# Patient Record
Sex: Male | Born: 1945 | Race: Black or African American | Hispanic: No | Marital: Married | Smoking: Former smoker
Health system: Southern US, Community
[De-identification: ages and names within clinical notes are randomized; demographics above are authoritative.]

## PROBLEM LIST (undated history)

## (undated) DIAGNOSIS — F419 Anxiety disorder, unspecified: Secondary | ICD-10-CM

## (undated) DIAGNOSIS — I10 Essential (primary) hypertension: Secondary | ICD-10-CM

## (undated) DIAGNOSIS — G4730 Sleep apnea, unspecified: Secondary | ICD-10-CM

## (undated) DIAGNOSIS — M1990 Unspecified osteoarthritis, unspecified site: Secondary | ICD-10-CM

## (undated) DIAGNOSIS — C801 Malignant (primary) neoplasm, unspecified: Secondary | ICD-10-CM

## (undated) DIAGNOSIS — K219 Gastro-esophageal reflux disease without esophagitis: Secondary | ICD-10-CM

## (undated) DIAGNOSIS — F32A Depression, unspecified: Secondary | ICD-10-CM

## (undated) DIAGNOSIS — R0600 Dyspnea, unspecified: Secondary | ICD-10-CM

## (undated) DIAGNOSIS — J189 Pneumonia, unspecified organism: Secondary | ICD-10-CM

## (undated) HISTORY — PX: PROSTATE SURGERY: SHX751

## (undated) HISTORY — PX: ROTATOR CUFF REPAIR: SHX139

---

## 1998-03-08 ENCOUNTER — Encounter: Payer: Self-pay | Admitting: *Deleted

## 1998-03-08 ENCOUNTER — Ambulatory Visit (HOSPITAL_COMMUNITY): Admission: RE | Admit: 1998-03-08 | Discharge: 1998-03-08 | Payer: Self-pay | Admitting: *Deleted

## 1998-07-29 ENCOUNTER — Encounter: Payer: Self-pay | Admitting: Family Medicine

## 1998-07-29 ENCOUNTER — Ambulatory Visit (HOSPITAL_COMMUNITY): Admission: RE | Admit: 1998-07-29 | Discharge: 1998-07-29 | Payer: Self-pay | Admitting: Family Medicine

## 1999-12-19 ENCOUNTER — Emergency Department (HOSPITAL_COMMUNITY): Admission: EM | Admit: 1999-12-19 | Discharge: 1999-12-19 | Payer: Self-pay

## 2000-03-21 ENCOUNTER — Encounter: Admission: RE | Admit: 2000-03-21 | Discharge: 2000-03-21 | Payer: Self-pay | Admitting: Urology

## 2000-03-21 ENCOUNTER — Encounter: Payer: Self-pay | Admitting: Urology

## 2001-02-11 ENCOUNTER — Encounter: Payer: Self-pay | Admitting: Emergency Medicine

## 2001-02-11 ENCOUNTER — Emergency Department (HOSPITAL_COMMUNITY): Admission: EM | Admit: 2001-02-11 | Discharge: 2001-02-12 | Payer: Self-pay | Admitting: Emergency Medicine

## 2001-04-26 ENCOUNTER — Emergency Department (HOSPITAL_COMMUNITY): Admission: EM | Admit: 2001-04-26 | Discharge: 2001-04-26 | Payer: Self-pay | Admitting: *Deleted

## 2002-06-25 ENCOUNTER — Encounter: Admission: RE | Admit: 2002-06-25 | Discharge: 2002-09-23 | Payer: Self-pay | Admitting: *Deleted

## 2003-10-30 ENCOUNTER — Ambulatory Visit (HOSPITAL_COMMUNITY): Admission: RE | Admit: 2003-10-30 | Discharge: 2003-10-30 | Payer: Self-pay | Admitting: Gastroenterology

## 2006-06-28 ENCOUNTER — Emergency Department (HOSPITAL_COMMUNITY): Admission: EM | Admit: 2006-06-28 | Discharge: 2006-06-28 | Payer: Self-pay | Admitting: Emergency Medicine

## 2008-11-10 ENCOUNTER — Ambulatory Visit: Payer: Self-pay | Admitting: Sports Medicine

## 2008-11-10 DIAGNOSIS — M2140 Flat foot [pes planus] (acquired), unspecified foot: Secondary | ICD-10-CM | POA: Insufficient documentation

## 2008-11-10 DIAGNOSIS — M7660 Achilles tendinitis, unspecified leg: Secondary | ICD-10-CM

## 2008-11-10 DIAGNOSIS — M2010 Hallux valgus (acquired), unspecified foot: Secondary | ICD-10-CM | POA: Insufficient documentation

## 2008-11-10 DIAGNOSIS — M79609 Pain in unspecified limb: Secondary | ICD-10-CM

## 2009-03-07 ENCOUNTER — Emergency Department (HOSPITAL_COMMUNITY): Admission: EM | Admit: 2009-03-07 | Discharge: 2009-03-07 | Payer: Self-pay | Admitting: Emergency Medicine

## 2009-06-28 ENCOUNTER — Emergency Department (HOSPITAL_COMMUNITY): Admission: EM | Admit: 2009-06-28 | Discharge: 2009-06-29 | Payer: Self-pay | Admitting: Emergency Medicine

## 2010-07-02 ENCOUNTER — Encounter: Payer: Self-pay | Admitting: *Deleted

## 2010-07-27 LAB — URINALYSIS, ROUTINE W REFLEX MICROSCOPIC
Glucose, UA: NEGATIVE mg/dL
Hgb urine dipstick: NEGATIVE
Ketones, ur: NEGATIVE mg/dL
pH: 5.5 (ref 5.0–8.0)

## 2010-07-27 LAB — URINE MICROSCOPIC-ADD ON

## 2010-09-09 NOTE — Op Note (Signed)
NAME:  Dean Brown, Dean Brown                          ACCOUNT NO.:  [id number]   MEDICAL RECORD NO.:  [id number]                   PATIENT TYPE:  AMB   LOCATION:  ENDO                                 FACILITY:  [facility]   PHYSICIAN:  John C. Madilyn Fireman, M.D.                 DATE OF BIRTH:  [birth year]   DATE OF PROCEDURE:  10/30/2003  DATE OF DISCHARGE:                                 OPERATIVE REPORT   PROCEDURE:  Colonoscopy.   INDICATIONS FOR PROCEDURE:  Family history of colon cancer in a first-degree  relative.   DESCRIPTION OF PROCEDURE:  The patient was placed in the left lateral  decubitus position and placed on the pulse monitor with continuous low-flow  oxygen delivered by nasal cannula.  He was sedated with 25 mcg IV fentanyl  and 1 mg IV Versed in addition to the medicine given for the EGD.  The  Olympus video colonoscope was inserted into the rectum and advanced to the  cecum, confirmed by transillumination of McBurney's point and visualization  of the ileocecal valve and appendiceal orifice.  Prep was generally good but  was suboptimal in some areas and I could not rule out small lesions less  than 1 cm in diameter in all areas. Otherwise the cecum, ascending,  transverse, descending, sigmoid, and rectum all appeared normal with no  masses, polyps, diverticula, or other mucosal abnormalities.  The scope was  then withdrawn and the patient returned to the recovery room in stable  condition.  He tolerated the procedure well and there were no immediate  complications.   IMPRESSION:  Normal colonoscopy.   PLAN:  Repeat study in five years based on his family history.                                               John C. Madilyn Fireman, M.D.    JCH/MEDQ  D:  10/30/2003  T:  10/30/2003  Job:  [id number]   cc:   Stacie Acres. White, M.D.  [address]  [place]  [place] [address]  Fax: [phone]

## 2010-09-09 NOTE — Op Note (Signed)
NAME:  Dean Brown, Dean Brown                          ACCOUNT NO.:  [id number]   MEDICAL RECORD NO.:  [id number]                   PATIENT TYPE:  AMB   LOCATION:  ENDO                                 FACILITY:  [facility]   PHYSICIAN:  John C. Madilyn Fireman, M.D.                 DATE OF BIRTH:  1945/11/15   DATE OF PROCEDURE:  10/30/2003  DATE OF DISCHARGE:                                 OPERATIVE REPORT   PROCEDURE:  Esophagogastroduodenoscopy.   INDICATIONS FOR PROCEDURE:  The patient undergoing colonoscopy due to a  family history of colon cancer who has had chronic reflux symptoms requiring  chronic use of proton pump inhibitors. The procedure is being done as a one  time screen for Barrett's esophagus.   DESCRIPTION OF PROCEDURE:  The patient was placed in the left lateral  decubitus position then placed on the pulse monitor with continuous low flow  oxygen delivered by nasal cannula. He was sedated with 50 mcg IV fentanyl  and 5 mg of IV Versed.  The Olympus video endoscope was advanced under  direct vision into the oropharynx and esophagus. The esophagus was straight  and of normal caliber with the squamocolumnar line at 37 cm above a 1 1/2 cm  hiatal hernia. There is no visible  esophagitis, ring, stricture or other  abnormality of the GE junction and the squamocolumnar line did appear to be  at the lower esophageal sphincter. The stomach was entered and a small  amount of liquid secretions were suctioned from the fundus. Retroflexed view  of the cardia confirmed a small hiatal hernia and was unremarkable. The  fundus, body, antrum and pylorus all appeared normal. The duodenum was  entered and both the bulb and second portion are well inspected and appear  to be within normal limits. The scope was then withdrawn and the patient  prepared for colonoscopy.  He tolerated the procedure well and there were no  immediate complications.   IMPRESSION:  Small hiatal hernia otherwise normal  study.                                               John C. Madilyn Fireman, M.D.    JCH/MEDQ  D:  10/30/2003  T:  10/30/2003  Job:  [id number]   cc:   Stacie Acres. White, M.D.  [address]  [place]  [place] [address]  Fax: [phone]

## 2011-07-22 DIAGNOSIS — E291 Testicular hypofunction: Secondary | ICD-10-CM | POA: Insufficient documentation

## 2011-08-04 DIAGNOSIS — G4733 Obstructive sleep apnea (adult) (pediatric): Secondary | ICD-10-CM | POA: Diagnosis present

## 2011-10-13 ENCOUNTER — Emergency Department (INDEPENDENT_AMBULATORY_CARE_PROVIDER_SITE_OTHER)
Admission: EM | Admit: 2011-10-13 | Discharge: 2011-10-13 | Disposition: A | Discharge status: Home / Self Care | Payer: BC Managed Care – PPO | Source: Home / Self Care | Attending: Emergency Medicine | Admitting: Emergency Medicine

## 2011-10-13 ENCOUNTER — Emergency Department (INDEPENDENT_AMBULATORY_CARE_PROVIDER_SITE_OTHER)

## 2011-10-13 ENCOUNTER — Encounter (HOSPITAL_COMMUNITY): Payer: Self-pay | Admitting: Emergency Medicine

## 2011-10-13 DIAGNOSIS — M5430 Sciatica, unspecified side: Secondary | ICD-10-CM

## 2011-10-13 HISTORY — DX: Malignant (primary) neoplasm, unspecified: C80.1

## 2011-10-13 HISTORY — DX: Essential (primary) hypertension: I10

## 2011-10-13 MED ORDER — KETOROLAC TROMETHAMINE 60 MG/2ML IM SOLN
INTRAMUSCULAR | Status: AC
  Filled 2011-10-13: qty 2

## 2011-10-13 MED ORDER — ONDANSETRON HCL 4 MG/2ML IJ SOLN
4.0000 mg | Freq: Once | INTRAMUSCULAR | Status: AC
  Administered 2011-10-13: 4 mg via INTRAMUSCULAR

## 2011-10-13 MED ORDER — OXYCODONE-ACETAMINOPHEN 5-325 MG PO TABS: 2.0000 | ORAL_TABLET | ORAL | Status: AC | PRN

## 2011-10-13 MED ORDER — KETOROLAC TROMETHAMINE 60 MG/2ML IM SOLN
60.0000 mg | Freq: Once | INTRAMUSCULAR | Status: AC
  Administered 2011-10-13: 60 mg via INTRAMUSCULAR

## 2011-10-13 MED ORDER — HYDROMORPHONE HCL PF 1 MG/ML IJ SOLN
2.0000 mg | Freq: Once | INTRAMUSCULAR | Status: AC
  Administered 2011-10-13: 2 mg via INTRAMUSCULAR

## 2011-10-13 MED ORDER — DIAZEPAM 5 MG PO TABS: 5.0000 mg | ORAL_TABLET | Freq: Two times a day (BID) | ORAL | Status: DC

## 2011-10-13 MED ORDER — HYDROMORPHONE HCL PF 1 MG/ML IJ SOLN
INTRAMUSCULAR | Status: AC
  Filled 2011-10-13: qty 2

## 2011-10-13 MED ORDER — ONDANSETRON HCL 4 MG/2ML IJ SOLN
INTRAMUSCULAR | Status: AC
  Filled 2011-10-13: qty 2

## 2011-10-13 NOTE — Discharge Instructions (Signed)

## 2011-10-13 NOTE — ED Notes (Signed)
Return from xray

## 2011-10-13 NOTE — ED Provider Notes (Signed)
History     CSN: [id number]  Arrival date & time 10/13/11  1358   First MD Initiated Contact with Patient 10/13/11 1550      Chief Complaint  Patient presents with  . Back Pain    (Consider location/radiation/quality/duration/timing/severity/associated sxs/prior treatment) Patient is a 66 y.o. male presenting with back pain. The history is provided by the patient. No language interpreter was used.  Back Pain  This is a new problem. The current episode started more than 1 week ago. The problem occurs constantly. The problem has been gradually worsening. The pain is associated with no known injury. The pain is present in the lumbar spine and sacro-iliac joint. The quality of the pain is described as stabbing. The pain is at a severity of 6/10. The pain is moderate. The symptoms are aggravated by bending. The treatment provided moderate relief.  [facility].  Pt reports no relief.  Pain is severe.  Past Medical History  Diagnosis Date  . Hypertension   . Diabetes mellitus   . Cancer     Past Surgical History  Procedure Date  . Rotator cuff repair     left shoulder  . Prostate surgery     s/p cancer    No family history on file.  History  Substance Use Topics  . Smoking status: Never Smoker   . Smokeless tobacco: Not on file  . Alcohol Use: Yes      Review of Systems  Musculoskeletal: Positive for back pain.  All other systems reviewed and are negative.    Allergies  Review of patient's allergies indicates no known allergies.  Home Medications   Current Outpatient Rx  Name Route Sig Dispense Refill  . AMLODIPINE BESYLATE 10 MG PO TABS Oral Take 10 mg by mouth daily.    . ASPIRIN 325 MG PO TABS Oral Take 325 mg by mouth daily.    Marland Kitchen DICLOFENAC SODIUM 1 % TD GEL Topical Apply topically.    Marland Kitchen GLIPIZIDE ER 10 MG PO TB24 Oral Take 10 mg by mouth daily.    . INSULIN GLARGINE 100 UNIT/ML [place] SOLN Subcutaneous  Inject 10 Units into the skin at bedtime.    Marland Kitchen LISINOPRIL 10 MG PO TABS Oral Take 10 mg by mouth daily.    Marland Kitchen OMEPRAZOLE 20 MG PO CPDR Oral Take 20 mg by mouth daily.    Marland Kitchen PIOGLITAZONE HCL 15 MG PO TABS Oral Take 20 mg by mouth daily.    . TRIAMTERENE-HCTZ 37.5-25 MG PO CAPS Oral Take 1 capsule by mouth every morning.    Marland Kitchen NITROGLYCERIN 0.2 MG/HR TD PT24  1/4 patch over achilles each day - change every morning       BP 163/84  Pulse 93  Temp 97.1 F (36.2 C) (Oral)  Resp 16  SpO2 97%  Physical Exam  Nursing note and vitals reviewed. Constitutional: He is oriented to person, place, and time. He appears well-developed and well-nourished.  HENT:  Head: Normocephalic and atraumatic.  Right Ear: External ear normal.  Left Ear: External ear normal.  Nose: Nose normal.  Mouth/Throat: Oropharynx is clear and moist.  Eyes: Pupils are equal, round, and reactive to light.  Cardiovascular: Normal rate and normal heart sounds.   Pulmonary/Chest: Effort normal and breath sounds normal.  Abdominal: Soft. Bowel sounds are normal.  Musculoskeletal: Normal range of motion.  Neurological: He is alert and oriented to person, place, and time. He  has normal reflexes.  Skin: Skin is warm.  Psychiatric: He has a normal mood and affect.    ED Course  Procedures (including critical care time)  Labs Reviewed - No data to display Dg Lumbar Spine Complete  10/13/2011  *RADIOLOGY REPORT*  Clinical Data: Back pain, injured 5 days ago working in yard, past history of prostate cancer  LUMBAR SPINE - COMPLETE 4+ VIEW  Comparison: None.  Findings: Five non-rib bearing lumbar vertebrae. Osseous mineralization normal. Question minimal sclerosis along the SI joints bilaterally. Vertebral body and disc space heights maintained. Tiny end plate spurs at L5. No acute fracture, subluxation or bone destruction. No spondylolysis. SI joints symmetric.  IMPRESSION: No acute lumbar spine abnormalities. Question mild bilateral  sacroiliitis.  Original Report Authenticated By: Lollie Marrow, M.D.     1. Sciatica       MDM  Pt given rx for valium and percocet         Elson Areas, [place] 10/13/11 1711

## 2011-10-13 NOTE — ED Notes (Signed)
pcp is located at [facility]

## 2011-10-13 NOTE — ED Notes (Signed)
Low back pain onset one week ago .  Patient reports he was working in garden, lifting bags of topsoil, did not feel pain at that time, but later started having pain.  Patient went to [facility] on Monday and Tuesday, but continues to hurt.  Denies numbness or tingling in legs

## 2011-10-14 NOTE — ED Provider Notes (Signed)
D/w PA. Exam was nml. No red flags. Agree with plan Medical screening examination/treatment/procedure(s) were performed by non-physician practitioner and as supervising physician I was immediately available for consultation/collaboration.  Luiz Blare MD   Luiz Blare, MD 10/14/11 [phone]

## 2011-10-17 ENCOUNTER — Other Ambulatory Visit: Payer: Self-pay | Admitting: Family Medicine

## 2011-10-17 DIAGNOSIS — M545 Low back pain: Secondary | ICD-10-CM

## 2011-10-18 ENCOUNTER — Ambulatory Visit
Admission: RE | Admit: 2011-10-18 | Discharge: 2011-10-18 | Disposition: A | Discharge status: Home / Self Care | Source: Ambulatory Visit | Attending: Family Medicine | Admitting: Family Medicine

## 2011-10-18 DIAGNOSIS — M545 Low back pain: Secondary | ICD-10-CM

## 2011-10-23 ENCOUNTER — Encounter (HOSPITAL_COMMUNITY): Payer: Self-pay | Admitting: Pharmacy Technician

## 2011-10-23 ENCOUNTER — Other Ambulatory Visit: Payer: Self-pay | Admitting: Orthopedic Surgery

## 2011-10-30 ENCOUNTER — Encounter (HOSPITAL_COMMUNITY)
Admission: RE | Admit: 2011-10-30 | Discharge: 2011-10-30 | Disposition: A | Discharge status: Home / Self Care | Payer: BC Managed Care – PPO | Source: Ambulatory Visit | Attending: Orthopedic Surgery | Admitting: Orthopedic Surgery

## 2011-10-30 ENCOUNTER — Encounter (HOSPITAL_COMMUNITY): Payer: Self-pay

## 2011-10-30 HISTORY — DX: Sleep apnea, unspecified: G47.30

## 2011-10-30 HISTORY — DX: Gastro-esophageal reflux disease without esophagitis: K21.9

## 2011-10-30 LAB — DIFFERENTIAL
Eosinophils Relative: 0 % (ref 0–5)
Lymphocytes Relative: 18 % (ref 12–46)
Lymphs Abs: 1.7 10*3/uL (ref 0.7–4.0)
Monocytes Absolute: 0.7 10*3/uL (ref 0.1–1.0)
Monocytes Relative: 8 % (ref 3–12)

## 2011-10-30 LAB — CBC
MCH: 31.2 pg (ref 26.0–34.0)
MCHC: 33.8 g/dL (ref 30.0–36.0)
Platelets: 312 10*3/uL (ref 150–400)
RDW: 12.9 % (ref 11.5–15.5)

## 2011-10-30 LAB — COMPREHENSIVE METABOLIC PANEL
ALT: 87 U/L — ABNORMAL HIGH (ref 0–53)
AST: 33 U/L (ref 0–37)
Albumin: 3.8 g/dL (ref 3.5–5.2)
Alkaline Phosphatase: 67 U/L (ref 39–117)
Calcium: 9.6 mg/dL (ref 8.4–10.5)
GFR calc Af Amer: 49 mL/min — ABNORMAL LOW (ref >90)
Potassium: 3.7 mEq/L (ref 3.5–5.1)
Sodium: 136 mEq/L (ref 135–145)
Total Protein: 7.8 g/dL (ref 6.0–8.3)

## 2011-10-30 LAB — APTT: aPTT: 26 seconds (ref 24–37)

## 2011-10-30 LAB — TYPE AND SCREEN: Antibody Screen: NEGATIVE

## 2011-10-30 LAB — SURGICAL PCR SCREEN
MRSA, PCR: NEGATIVE
Staphylococcus aureus: NEGATIVE

## 2011-10-30 LAB — PROTIME-INR: Prothrombin Time: 13.3 seconds (ref 11.6–15.2)

## 2011-10-30 NOTE — Progress Notes (Addendum)
Pt unable to provide urine sample at PAT, obtain DOS.  Pt states had sleep study and other testing at [facility], requested this along with any other tests such as EKG, ECHO, Stress test. Requested old EKG from [place] for comparison.

## 2011-10-30 NOTE — Pre-Procedure Instructions (Signed)
20 GRAYSIN LUCZYNSKI  10/30/2011   Your procedure is scheduled on:  Wednesday July 10  Call the [facility] Short Stay Center at 8:00 AM for arrival time.  Call this number if you have problems the morning of surgery: [phone]   Remember:   Do not eat food:After Midnight.  May have clear liquids:until Midnight .  Clear liquids include soda, tea, black coffee, apple or grape juice, broth.  Take these medicines the morning of surgery with A SIP OF WATER: Amlodipine, Omeprazole. Bring Nitro to surgery.    Do not wear jewelry, make-up or nail polish.  Do not wear lotions, powders, or perfumes. You may wear deodorant.  Do not shave 48 hours prior to surgery. Men may shave face and neck.  Do not bring valuables to the hospital.  Contacts, dentures or bridgework may not be worn into surgery.  Leave suitcase in the car. After surgery it may be brought to your room.  For patients admitted to the hospital, checkout time is 11:00 AM the day of discharge.   Patients discharged the day of surgery will not be allowed to drive home.  Name and phone number of your driver: family  Special Instructions: Incentive Spirometry - Practice and bring it with you on the day of surgery. and CHG Shower Use Special Wash: 1/2 bottle night before surgery and 1/2 bottle morning of surgery.   Please read over the following fact sheets that you were given: Pain Booklet, Coughing and Deep Breathing, Blood Transfusion Information and Surgical Site Infection Prevention

## 2011-10-31 ENCOUNTER — Encounter (HOSPITAL_COMMUNITY): Payer: Self-pay | Admitting: Vascular Surgery

## 2011-10-31 LAB — ABO/RH: ABO/RH(D): B POS

## 2011-10-31 MED ORDER — CLINDAMYCIN PHOSPHATE 900 MG/50ML IV SOLN
900.0000 mg | INTRAVENOUS | Status: AC
  Administered 2011-11-01: 900 mg via INTRAVENOUS
  Filled 2011-10-31: qty 50

## 2011-10-31 NOTE — Consult Note (Signed)
Anesthesia Chart Review:  Patient is a 66 year old male scheduled for left L5-S1 microdiscectomy on 11/01/11 by Dr. Yevette Edwards.  History includes non-smoker, HTN, DM2, OSA, GERD, prostate cancer.      Labs noted.  Cr 1.64, glucose 248, AST normal at 33, but ALT elevated at 87.  PLT and coags WNL.  Patient could not provide a urine specimen, so a UA is going to be done on arrival.  He will get a CBG on arrival.  CXR on 10/30/11 showed: 1. Stable exam of the chest.  2. No acute cardiopulmonary findings.  3. Prominent ascending aorta may indicate aortic stenosis.  EKG shows ST @ 129, biatrial enlargement, LVH.  (HR was actually documented as 137 on arrival to PAT.)  I was not asked to see him during his PAT visit.  He reported significant pain and was "rushing" to make his appointment.  He denied chest pain or SOB.  He says he was fairly active until approximately three weeks ago when he developed severe hip and leg pain.  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  He reports a remote history of a stress test when he was in the [employer], but otherwise no recent cardiac studies.  I reviewed EKG and CXR findings with Anesthesiologist Dr. Katrinka Blazing.  He will be evaluated by his assigned Anesthesiologist pre-operatively.  If no worrisome cardiac findings such as significant murmur or symptomatic tachycardia, then anticipate he can proceed.    Shonna Chock, PA-C

## 2011-11-01 ENCOUNTER — Inpatient Hospital Stay (HOSPITAL_COMMUNITY)
Admission: RE | Admit: 2011-11-01 | Discharge: 2011-11-02 | DRG: 758 | Disposition: A | Discharge status: Home / Self Care | Payer: BC Managed Care – PPO | Source: Ambulatory Visit | Attending: Orthopedic Surgery | Admitting: Orthopedic Surgery

## 2011-11-01 ENCOUNTER — Encounter (HOSPITAL_COMMUNITY): Payer: Self-pay | Admitting: *Deleted

## 2011-11-01 ENCOUNTER — Encounter (HOSPITAL_COMMUNITY)
Admission: RE | Disposition: A | Discharge status: Home / Self Care | Payer: Self-pay | Source: Ambulatory Visit | Attending: Orthopedic Surgery

## 2011-11-01 ENCOUNTER — Ambulatory Visit (HOSPITAL_COMMUNITY): Payer: BC Managed Care – PPO | Admitting: Vascular Surgery

## 2011-11-01 ENCOUNTER — Ambulatory Visit (HOSPITAL_COMMUNITY): Payer: BC Managed Care – PPO

## 2011-11-01 ENCOUNTER — Encounter (HOSPITAL_COMMUNITY): Payer: Self-pay | Admitting: Vascular Surgery

## 2011-11-01 DIAGNOSIS — K219 Gastro-esophageal reflux disease without esophagitis: Secondary | ICD-10-CM | POA: Diagnosis present

## 2011-11-01 DIAGNOSIS — M5410 Radiculopathy, site unspecified: Secondary | ICD-10-CM

## 2011-11-01 DIAGNOSIS — Z01812 Encounter for preprocedural laboratory examination: Secondary | ICD-10-CM

## 2011-11-01 DIAGNOSIS — M5126 Other intervertebral disc displacement, lumbar region: Principal | ICD-10-CM | POA: Diagnosis present

## 2011-11-01 DIAGNOSIS — I10 Essential (primary) hypertension: Secondary | ICD-10-CM | POA: Diagnosis present

## 2011-11-01 DIAGNOSIS — IMO0002 Reserved for concepts with insufficient information to code with codable children: Secondary | ICD-10-CM | POA: Diagnosis present

## 2011-11-01 DIAGNOSIS — E119 Type 2 diabetes mellitus without complications: Secondary | ICD-10-CM | POA: Diagnosis present

## 2011-11-01 DIAGNOSIS — G4730 Sleep apnea, unspecified: Secondary | ICD-10-CM | POA: Diagnosis present

## 2011-11-01 HISTORY — PX: OTHER SURGICAL HISTORY: SHX169

## 2011-11-01 HISTORY — PX: LUMBAR LAMINECTOMY/DECOMPRESSION MICRODISCECTOMY: SHX5026

## 2011-11-01 LAB — GLUCOSE, CAPILLARY
Glucose-Capillary: 151 mg/dL — ABNORMAL HIGH (ref 70–99)
Glucose-Capillary: 104 mg/dL — ABNORMAL HIGH (ref 70–99)

## 2011-11-01 SURGERY — LUMBAR LAMINECTOMY/DECOMPRESSION MICRODISCECTOMY
Anesthesia: General | Site: Spine Lumbar | Laterality: Left | Wound class: Clean

## 2011-11-01 MED ORDER — POTASSIUM CHLORIDE IN NACL 20-0.9 MEQ/L-% IV SOLN
INTRAVENOUS | Status: DC
  Administered 2011-11-02: 01:00:00 via INTRAVENOUS
  Filled 2011-11-01 (×2): qty 1000

## 2011-11-01 MED ORDER — ONDANSETRON HCL 4 MG/2ML IJ SOLN: 4.0000 mg | Freq: Once | INTRAMUSCULAR | Status: DC | PRN

## 2011-11-01 MED ORDER — ROCURONIUM BROMIDE 100 MG/10ML IV SOLN
INTRAVENOUS | Status: DC | PRN
  Administered 2011-11-01: 50 mg via INTRAVENOUS

## 2011-11-01 MED ORDER — OXYCODONE-ACETAMINOPHEN 5-325 MG PO TABS: 1.0000 | ORAL_TABLET | ORAL | Status: DC | PRN

## 2011-11-01 MED ORDER — LIDOCAINE HCL (CARDIAC) 20 MG/ML IV SOLN
INTRAVENOUS | Status: DC | PRN
  Administered 2011-11-01: 60 mg via INTRAVENOUS
  Administered 2011-11-01: 40 mg via INTRAVENOUS

## 2011-11-01 MED ORDER — DOCUSATE SODIUM 100 MG PO CAPS
100.0000 mg | ORAL_CAPSULE | Freq: Two times a day (BID) | ORAL | Status: DC
  Administered 2011-11-02 (×2): 100 mg via ORAL
  Filled 2011-11-01 (×3): qty 1

## 2011-11-01 MED ORDER — INDIGOTINDISULFONATE SODIUM 8 MG/ML IJ SOLN
INTRAMUSCULAR | Status: DC | PRN
  Administered 2011-11-01: 1 mL via INTRAVENOUS

## 2011-11-01 MED ORDER — DEXAMETHASONE SODIUM PHOSPHATE 4 MG/ML IJ SOLN
INTRAMUSCULAR | Status: DC | PRN
  Administered 2011-11-01: 8 mg via INTRAVENOUS

## 2011-11-01 MED ORDER — AMLODIPINE BESYLATE 10 MG PO TABS
10.0000 mg | ORAL_TABLET | Freq: Every day | ORAL | Status: DC
  Administered 2011-11-02: 10 mg via ORAL
  Filled 2011-11-01: qty 1

## 2011-11-01 MED ORDER — LISINOPRIL 10 MG PO TABS
10.0000 mg | ORAL_TABLET | Freq: Every day | ORAL | Status: DC
  Administered 2011-11-02: 10 mg via ORAL
  Filled 2011-11-01: qty 1

## 2011-11-01 MED ORDER — POVIDONE-IODINE 7.5 % EX SOLN
Freq: Once | CUTANEOUS | Status: DC
  Filled 2011-11-01: qty 118

## 2011-11-01 MED ORDER — ONDANSETRON HCL 4 MG/2ML IJ SOLN: 4.0000 mg | INTRAMUSCULAR | Status: DC | PRN

## 2011-11-01 MED ORDER — PANTOPRAZOLE SODIUM 40 MG PO TBEC
40.0000 mg | DELAYED_RELEASE_TABLET | Freq: Every day | ORAL | Status: DC
  Administered 2011-11-02: 40 mg via ORAL
  Filled 2011-11-01: qty 1

## 2011-11-01 MED ORDER — SODIUM CHLORIDE 0.9 % IJ SOLN: 3.0000 mL | INTRAMUSCULAR | Status: DC | PRN

## 2011-11-01 MED ORDER — NITROGLYCERIN 0.2 MG/HR TD PT24: 0.2000 mg | MEDICATED_PATCH | Freq: Every day | TRANSDERMAL | Status: DC | PRN

## 2011-11-01 MED ORDER — HYDROMORPHONE HCL PF 1 MG/ML IJ SOLN
0.2500 mg | INTRAMUSCULAR | Status: DC | PRN
  Administered 2011-11-01 (×2): 0.5 mg via INTRAVENOUS

## 2011-11-01 MED ORDER — MORPHINE SULFATE 2 MG/ML IJ SOLN: 2.0000 mg | INTRAMUSCULAR | Status: DC | PRN

## 2011-11-01 MED ORDER — THROMBIN 20000 UNITS EX KIT
PACK | CUTANEOUS | Status: AC
  Filled 2011-11-01: qty 1

## 2011-11-01 MED ORDER — FENTANYL CITRATE 0.05 MG/ML IJ SOLN
INTRAMUSCULAR | Status: DC | PRN
  Administered 2011-11-01 (×4): 100 ug via INTRAVENOUS

## 2011-11-01 MED ORDER — HYDROMORPHONE HCL PF 1 MG/ML IJ SOLN
INTRAMUSCULAR | Status: AC
  Filled 2011-11-01: qty 1

## 2011-11-01 MED ORDER — BUPIVACAINE-EPINEPHRINE PF 0.25-1:200000 % IJ SOLN
INTRAMUSCULAR | Status: AC
  Filled 2011-11-01: qty 30

## 2011-11-01 MED ORDER — MENTHOL 3 MG MT LOZG: 1.0000 | LOZENGE | OROMUCOSAL | Status: DC | PRN

## 2011-11-01 MED ORDER — CEFAZOLIN SODIUM 1-5 GM-% IV SOLN
1.0000 g | Freq: Three times a day (TID) | INTRAVENOUS | Status: AC
  Administered 2011-11-02 (×2): 1 g via INTRAVENOUS
  Filled 2011-11-01 (×2): qty 50

## 2011-11-01 MED ORDER — PROPOFOL 10 MG/ML IV EMUL
INTRAVENOUS | Status: DC | PRN
  Administered 2011-11-01: 160 mg via INTRAVENOUS

## 2011-11-01 MED ORDER — THROMBIN 20000 UNITS EX KIT
PACK | CUTANEOUS | Status: DC | PRN
  Administered 2011-11-01: 21:00:00 via TOPICAL

## 2011-11-01 MED ORDER — FENTANYL CITRATE 0.05 MG/ML IJ SOLN
50.0000 ug | INTRAMUSCULAR | Status: DC | PRN
  Administered 2011-11-01 (×2): 50 ug via INTRAVENOUS

## 2011-11-01 MED ORDER — GLYCOPYRROLATE 0.2 MG/ML IJ SOLN
INTRAMUSCULAR | Status: DC | PRN
  Administered 2011-11-01: .6 mg via INTRAVENOUS

## 2011-11-01 MED ORDER — BUPIVACAINE-EPINEPHRINE 0.25% -1:200000 IJ SOLN
INTRAMUSCULAR | Status: DC | PRN
  Administered 2011-11-01: 10 mL

## 2011-11-01 MED ORDER — DEXTROSE 5 % IV SOLN
INTRAVENOUS | Status: DC | PRN
  Administered 2011-11-01: 21:00:00 via INTRAVENOUS

## 2011-11-01 MED ORDER — LACTATED RINGERS IV SOLN
INTRAVENOUS | Status: DC | PRN
  Administered 2011-11-01 (×2): via INTRAVENOUS

## 2011-11-01 MED ORDER — DROPERIDOL 2.5 MG/ML IJ SOLN
INTRAMUSCULAR | Status: DC | PRN
  Administered 2011-11-01: 0.625 mg via INTRAVENOUS

## 2011-11-01 MED ORDER — NEOSTIGMINE METHYLSULFATE 1 MG/ML IJ SOLN
INTRAMUSCULAR | Status: DC | PRN
  Administered 2011-11-01: 5 mg via INTRAVENOUS

## 2011-11-01 MED ORDER — DIAZEPAM 5 MG PO TABS: 5.0000 mg | ORAL_TABLET | Freq: Four times a day (QID) | ORAL | Status: DC | PRN

## 2011-11-01 MED ORDER — FENTANYL CITRATE 0.05 MG/ML IJ SOLN
INTRAMUSCULAR | Status: AC
  Filled 2011-11-01: qty 2

## 2011-11-01 MED ORDER — THROMBIN 20000 UNITS EX SOLR
CUTANEOUS | Status: AC
  Filled 2011-11-01: qty 20000

## 2011-11-01 MED ORDER — ZOLPIDEM TARTRATE 5 MG PO TABS: 5.0000 mg | ORAL_TABLET | Freq: Every evening | ORAL | Status: DC | PRN

## 2011-11-01 MED ORDER — ACETAMINOPHEN 325 MG PO TABS: 650.0000 mg | ORAL_TABLET | ORAL | Status: DC | PRN

## 2011-11-01 MED ORDER — PIOGLITAZONE HCL 30 MG PO TABS
30.0000 mg | ORAL_TABLET | Freq: Every day | ORAL | Status: DC
  Administered 2011-11-02: 30 mg via ORAL
  Filled 2011-11-01: qty 1

## 2011-11-01 MED ORDER — MIDAZOLAM HCL 5 MG/5ML IJ SOLN
INTRAMUSCULAR | Status: DC | PRN
  Administered 2011-11-01 (×2): 2 mg via INTRAVENOUS

## 2011-11-01 MED ORDER — INDIGOTINDISULFONATE SODIUM 8 MG/ML IJ SOLN
INTRAMUSCULAR | Status: AC
  Filled 2011-11-01: qty 5

## 2011-11-01 MED ORDER — ALUM & MAG HYDROXIDE-SIMETH 200-200-20 MG/5ML PO SUSP: 30.0000 mL | Freq: Four times a day (QID) | ORAL | Status: DC | PRN

## 2011-11-01 MED ORDER — PHENOL 1.4 % MT LIQD: 1.0000 | OROMUCOSAL | Status: DC | PRN

## 2011-11-01 MED ORDER — THROMBIN 20000 UNITS EX KIT
PACK | CUTANEOUS | Status: DC | PRN
  Administered 2011-11-01: 20000 [IU] via TOPICAL

## 2011-11-01 MED ORDER — SODIUM CHLORIDE 0.9 % IJ SOLN: 3.0000 mL | Freq: Two times a day (BID) | INTRAMUSCULAR | Status: DC

## 2011-11-01 MED ORDER — INSULIN GLARGINE 100 UNIT/ML ~~LOC~~ SOLN: 10.0000 [IU] | Freq: Every day | SUBCUTANEOUS | Status: DC

## 2011-11-01 MED ORDER — SODIUM CHLORIDE 0.9 % IV SOLN: 250.0000 mL | INTRAVENOUS | Status: DC

## 2011-11-01 MED ORDER — METHYLPREDNISOLONE ACETATE 80 MG/ML IJ SUSP
INTRAMUSCULAR | Status: AC
  Filled 2011-11-01: qty 1

## 2011-11-01 MED ORDER — TRIAMTERENE-HCTZ 37.5-25 MG PO TABS
1.0000 | ORAL_TABLET | Freq: Every day | ORAL | Status: DC
  Administered 2011-11-02: 1 via ORAL
  Filled 2011-11-01: qty 1

## 2011-11-01 MED ORDER — GLIPIZIDE ER 10 MG PO TB24
10.0000 mg | ORAL_TABLET | Freq: Every day | ORAL | Status: DC
  Administered 2011-11-02: 10 mg via ORAL
  Filled 2011-11-01 (×2): qty 1

## 2011-11-01 MED ORDER — ONDANSETRON HCL 4 MG/2ML IJ SOLN
INTRAMUSCULAR | Status: DC | PRN
  Administered 2011-11-01: 4 mg via INTRAVENOUS

## 2011-11-01 MED ORDER — LACTATED RINGERS IV SOLN
INTRAVENOUS | Status: DC
  Administered 2011-11-01: 18:00:00 via INTRAVENOUS

## 2011-11-01 MED ORDER — METOCLOPRAMIDE HCL 5 MG/ML IJ SOLN
INTRAMUSCULAR | Status: DC | PRN
  Administered 2011-11-01: 10 mg via INTRAVENOUS

## 2011-11-01 MED ORDER — ACETAMINOPHEN 650 MG RE SUPP: 650.0000 mg | RECTAL | Status: DC | PRN

## 2011-11-01 MED ORDER — SENNA 8.6 MG PO TABS
1.0000 | ORAL_TABLET | Freq: Two times a day (BID) | ORAL | Status: DC
  Administered 2011-11-02 (×2): 8.6 mg via ORAL
  Filled 2011-11-01 (×3): qty 1

## 2011-11-01 SURGICAL SUPPLY — 62 items
APL SKNCLS STERI-STRIP NONHPOA (GAUZE/BANDAGES/DRESSINGS) ×2
BENZOIN TINCTURE PRP APPL 2/3 (GAUZE/BANDAGES/DRESSINGS) ×2 IMPLANT
BUR ROUND PRECISION 4.0 (BURR) ×2 IMPLANT
CANISTER SUCTION 2500CC (MISCELLANEOUS) ×2 IMPLANT
CLOTH BEACON ORANGE TIMEOUT ST (SAFETY) ×2 IMPLANT
CLSR STERI-STRIP ANTIMIC 1/2X4 (GAUZE/BANDAGES/DRESSINGS) ×1 IMPLANT
CONT SPEC STER OR (MISCELLANEOUS) ×1 IMPLANT
CORDS BIPOLAR (ELECTRODE) ×2 IMPLANT
COVER SURGICAL LIGHT HANDLE (MISCELLANEOUS) ×2 IMPLANT
DRAIN CHANNEL 10F 3/8 F FF (DRAIN) IMPLANT
DRAPE POUCH INSTRU U-SHP 10X18 (DRAPES) ×4 IMPLANT
DRAPE SURG 17X23 STRL (DRAPES) ×9 IMPLANT
DURAPREP 26ML APPLICATOR (WOUND CARE) ×2 IMPLANT
ELECT BLADE 4.0 EZ CLEAN MEGAD (MISCELLANEOUS)
ELECT BLADE 6.5 EXT (BLADE) IMPLANT
ELECT CAUTERY BLADE 6.4 (BLADE) ×2 IMPLANT
ELECT REM PT RETURN 9FT ADLT (ELECTROSURGICAL) ×2
ELECTRODE BLDE 4.0 EZ CLN MEGD (MISCELLANEOUS) IMPLANT
ELECTRODE REM PT RTRN 9FT ADLT (ELECTROSURGICAL) ×1 IMPLANT
EVACUATOR SILICONE 100CC (DRAIN) IMPLANT
FILTER STRAW FLUID ASPIR (MISCELLANEOUS) ×3 IMPLANT
GAUZE SPONGE 4X4 16PLY XRAY LF (GAUZE/BANDAGES/DRESSINGS) ×3 IMPLANT
GLOVE BIO SURGEON STRL SZ8 (GLOVE) ×2 IMPLANT
GLOVE BIOGEL PI IND STRL 8 (GLOVE) ×1 IMPLANT
GLOVE BIOGEL PI INDICATOR 8 (GLOVE) ×1
GOWN STRL NON-REIN LRG LVL3 (GOWN DISPOSABLE) ×4 IMPLANT
IV CATH 14GX2 1/4 (CATHETERS) ×2 IMPLANT
KIT BASIN OR (CUSTOM PROCEDURE TRAY) ×2 IMPLANT
KIT ROOM TURNOVER OR (KITS) ×2 IMPLANT
NDL 18GX1X1/2 (RX/OR ONLY) (NEEDLE) ×1 IMPLANT
NDL HYPO 25GX1X1/2 BEV (NEEDLE) ×1 IMPLANT
NDL SPNL 18GX3.5 QUINCKE PK (NEEDLE) ×2 IMPLANT
NEEDLE 18GX1X1/2 (RX/OR ONLY) (NEEDLE) ×2 IMPLANT
NEEDLE HYPO 25GX1X1/2 BEV (NEEDLE) ×2 IMPLANT
NEEDLE SPNL 18GX3.5 QUINCKE PK (NEEDLE) ×2 IMPLANT
NS IRRIG 1000ML POUR BTL (IV SOLUTION) ×2 IMPLANT
PACK LAMINECTOMY ORTHO (CUSTOM PROCEDURE TRAY) ×2 IMPLANT
PACK UNIVERSAL I (CUSTOM PROCEDURE TRAY) ×2 IMPLANT
PAD ARMBOARD 7.5X6 YLW CONV (MISCELLANEOUS) ×4 IMPLANT
PATTIES SURGICAL .5 X.5 (GAUZE/BANDAGES/DRESSINGS) ×1 IMPLANT
PATTIES SURGICAL .5 X1 (DISPOSABLE) ×2 IMPLANT
PATTIES SURGICAL 1X1 (DISPOSABLE) IMPLANT
SPONGE GAUZE 4X4 12PLY (GAUZE/BANDAGES/DRESSINGS) ×2 IMPLANT
STRIP CLOSURE SKIN 1/2X4 (GAUZE/BANDAGES/DRESSINGS) ×1 IMPLANT
SURGIFLO TRUKIT (HEMOSTASIS) IMPLANT
SUT ETHILON 3 0 FSL (SUTURE) IMPLANT
SUT VIC AB 0 CT1 27 (SUTURE)
SUT VIC AB 0 CT1 27XBRD ANBCTR (SUTURE) IMPLANT
SUT VIC AB 0 CT2 27 (SUTURE) ×2 IMPLANT
SUT VIC AB 1 CT1 18XCR BRD 8 (SUTURE) ×1 IMPLANT
SUT VIC AB 1 CT1 8-18 (SUTURE) ×2
SUT VIC AB 2-0 CT2 18 VCP726D (SUTURE) ×2 IMPLANT
SYR 20CC LL (SYRINGE) IMPLANT
SYR BULB IRRIGATION 50ML (SYRINGE) ×2 IMPLANT
SYR CONTROL 10ML LL (SYRINGE) ×2 IMPLANT
SYR TB 1ML 26GX3/8 SAFETY (SYRINGE) ×4 IMPLANT
SYR TB 1ML LUER SLIP (SYRINGE) ×4 IMPLANT
TAPE CLOTH SURG 4X10 WHT LF (GAUZE/BANDAGES/DRESSINGS) ×1 IMPLANT
TOWEL OR 17X24 6PK STRL BLUE (TOWEL DISPOSABLE) ×2 IMPLANT
TOWEL OR 17X26 10 PK STRL BLUE (TOWEL DISPOSABLE) ×2 IMPLANT
WATER STERILE IRR 1000ML POUR (IV SOLUTION) ×1 IMPLANT
YANKAUER SUCT BULB TIP NO VENT (SUCTIONS) ×2 IMPLANT

## 2011-11-01 NOTE — Anesthesia Procedure Notes (Signed)
Procedure Name: Intubation Date/Time: 11/01/2011 8:48 PM Performed by: Wray Kearns A Pre-anesthesia Checklist: Patient identified, Timeout performed, Emergency Drugs available, Suction available and Patient being monitored Patient Re-evaluated:Patient Re-evaluated prior to inductionOxygen Delivery Method: Circle system utilized Intubation Type: IV induction and Cricoid Pressure applied Ventilation: Mask ventilation without difficulty and Oral airway inserted - appropriate to patient size Laryngoscope Size: Mac and 4 Grade View: Grade II Tube type: Oral Tube size: 8.0 mm Number of attempts: 1 Airway Equipment and Method: Stylet Placement Confirmation: ETT inserted through vocal cords under direct vision,  breath sounds checked- equal and bilateral and positive ETCO2 Secured at: 23 cm Tube secured with: Tape Dental Injury: Teeth and Oropharynx as per pre-operative assessment

## 2011-11-01 NOTE — Preoperative (Signed)
Beta Blockers   Reason not to administer Beta Blockers:Not Applicable 

## 2011-11-01 NOTE — H&P (Signed)
PREOPERATIVE H&P  Chief Complaint: left leg pain  HPI: Dean Brown is a 66 y.o. male who presents with left leg pain  Past Medical History  Diagnosis Date  . Hypertension   . Diabetes mellitus   . Sleep apnea     cpap sleep study - [facility]  . GERD (gastroesophageal reflux disease)   . Cancer     Prostate cancer   Past Surgical History  Procedure Date  . Rotator cuff repair     left shoulder  . Prostate surgery     s/p cancer   History   Social History  . Marital Status: Married    Spouse Name: N/A    Number of Children: N/A  . Years of Education: N/A   Social History Main Topics  . Smoking status: Never Smoker   . Smokeless tobacco: Not on file  . Alcohol Use: Yes  . Drug Use: No  . Sexually Active:    Other Topics Concern  . Not on file   Social History Narrative  . No narrative on file   No family history on file. Allergies  Allergen Reactions  . Metformin And Related Nausea Only   Prior to Admission medications   Medication Sig Start Date End Date Taking? Authorizing Provider  amLODipine (NORVASC) 10 MG tablet Take 10 mg by mouth daily.   Yes Historical Provider, MD  aspirin 325 MG tablet Take 325 mg by mouth daily.   Yes Historical Provider, MD  diclofenac sodium (VOLTAREN) 1 % GEL Apply topically.   Yes Historical Provider, MD  glipiZIDE (GLUCOTROL XL) 10 MG 24 hr tablet Take 10 mg by mouth daily.   Yes Historical Provider, MD  insulin glargine (LANTUS) 100 UNIT/ML injection Inject 10 Units into the skin at bedtime.   Yes Historical Provider, MD  lisinopril (PRINIVIL,ZESTRIL) 10 MG tablet Take 10 mg by mouth daily.   Yes Historical Provider, MD  nitroGLYCERIN (NITRODUR) 0.2 mg/hr Place 1 patch onto the skin daily as needed. 1/4 patch over achilles each day - change every morning as needed for pain   Yes Historical Provider, MD  omeprazole (PRILOSEC) 20 MG capsule Take 20 mg by mouth daily.   Yes Historical Provider, MD  pioglitazone (ACTOS) 15 MG  tablet Take 30 mg by mouth daily.    Yes Historical Provider, MD  triamterene-hydrochlorothiazide (DYAZIDE) 37.5-25 MG per capsule Take 1 capsule by mouth every morning.   Yes Historical Provider, MD  cyclobenzaprine (FLEXERIL) 10 MG tablet Take 10 mg by mouth at bedtime.    Historical Provider, MD  traMADol (ULTRAM) 50 MG tablet Take 50 mg by mouth every 6 (six) hours as needed.    Historical Provider, MD     All other systems have been reviewed and were otherwise negative with the exception of those mentioned in the HPI and as above.  Physical Exam: There were no vitals filed for this visit.  General: Alert, no acute distress Cardiovascular: No pedal edema Respiratory: No cyanosis, no use of accessory musculature GI: No organomegaly, abdomen is soft and non-tender Skin: No lesions in the area of chief complaint Neurologic: Sensation intact distally Psychiatric: Patient is competent for consent with normal mood and affect Lymphatic: No axillary or cervical lymphadenopathy  MUSCULOSKELETAL: + SLR on left  Assessment/Plan: Left leg pain Plan for Procedure(s): LUMBAR LAMINECTOMY/DECOMPRESSION MICRODISCECTOMY   Emilee Hero, MD 11/01/2011 7:36 AM

## 2011-11-01 NOTE — Anesthesia Preprocedure Evaluation (Addendum)
Anesthesia Evaluation  Patient identified by MRN, date of birth, ID band Patient awake    Reviewed: Allergy & Precautions, H&P , NPO status , Patient's Chart, lab work & pertinent test results  Airway Mallampati: I TM Distance: >3 FB Neck ROM: Full    Dental   Pulmonary sleep apnea ,          Cardiovascular hypertension, Pt. on medications     Neuro/Psych    GI/Hepatic GERD-  Controlled and Medicated,  Endo/Other  Well Controlled, Type 2, Oral Hypoglycemic Agents and Insulin Dependent  Renal/GU CRF Cr 1.65     Musculoskeletal   Abdominal   Peds  Hematology   Anesthesia Other Findings   Reproductive/Obstetrics                          Anesthesia Physical Anesthesia Plan  ASA: II  Anesthesia Plan: General   Post-op Pain Management:    Induction: Intravenous  Airway Management Planned: Oral ETT  Additional Equipment:   Intra-op Plan:   Post-operative Plan: Extubation in OR  Informed Consent: I have reviewed the patients History and Physical, chart, labs and discussed the procedure including the risks, benefits and alternatives for the proposed anesthesia with the patient or authorized representative who has indicated his/her understanding and acceptance.     Plan Discussed with: CRNA and Surgeon  Anesthesia Plan Comments:         Anesthesia Quick Evaluation

## 2011-11-01 NOTE — Anesthesia Postprocedure Evaluation (Signed)
  Anesthesia Post-op Note  Patient: Dean Brown  Procedure(s) Performed: Procedure(s) (LRB): LUMBAR LAMINECTOMY/DECOMPRESSION MICRODISCECTOMY (Left)  Patient Location: PACU  Anesthesia Type: General  Level of Consciousness: awake, alert  and oriented  Airway and Oxygen Therapy: Patient Spontanous Breathing and Patient connected to nasal cannula oxygen  Post-op Pain: mild  Post-op Assessment: Post-op Vital signs reviewed and Patient's Cardiovascular Status Stable  Post-op Vital Signs: stable  Complications: No apparent anesthesia complications

## 2011-11-01 NOTE — Transfer of Care (Signed)
Immediate Anesthesia Transfer of Care Note  Patient: Dean Brown  Procedure(s) Performed: Procedure(s) (LRB): LUMBAR LAMINECTOMY/DECOMPRESSION MICRODISCECTOMY (Left)  Patient Location: PACU  Anesthesia Type: General  Level of Consciousness: oriented, sedated, patient cooperative and responds to stimulation  Airway & Oxygen Therapy: Patient Spontanous Breathing and Patient connected to nasal cannula oxygen  Post-op Assessment: Report given to PACU RN, Post -op Vital signs reviewed and stable, Patient moving all extremities and Patient moving all extremities X 4  Post vital signs: Reviewed and stable  Complications: No apparent anesthesia complications

## 2011-11-02 ENCOUNTER — Encounter (HOSPITAL_COMMUNITY): Payer: Self-pay | Admitting: General Practice

## 2011-11-02 NOTE — Progress Notes (Signed)
Inpatient Diabetes Program Recommendations  AACE/ADA: New Consensus Statement on Inpatient Glycemic Control (2009)  Target Ranges:  Prepandial:   less than 140 mg/dL      Peak postprandial:   less than 180 mg/dL (1-2 hours)      Critically ill patients:  140 - 180 mg/dL   Reason for Visit:   Inpatient Diabetes Program Recommendations Correction (SSI): Add Novolog sensitive correction scale before meals & HS while in hospital.  Note:

## 2011-11-02 NOTE — Progress Notes (Signed)
Utilization review completed. Bronsen Serano, RN, BSN. 

## 2011-11-02 NOTE — Progress Notes (Signed)
Patient reports resolution of left leg pain  NVI Dressing CDI Dressing in place  S/i L5/S1 microdiscectomy  - d/c home today - f/u 2 weeks

## 2011-11-02 NOTE — Op Note (Signed)
NAMERISHITH, SIDDOWAY NO.:  [id number]  MEDICAL RECORD NO.:  [id number]  LOCATION:  5N08C                        FACILITY:  [facility]  PHYSICIAN:  Estill Bamberg, MD      DATE OF BIRTH:  1945-09-09  DATE OF PROCEDURE:  11/01/2011 DATE OF DISCHARGE:                              OPERATIVE REPORT   PREOPERATIVE DIAGNOSIS:  Left-sided S1 radiculopathy.  POSTOPERATIVE DIAGNOSIS:  Left-sided S1 radiculopathy.  PROCEDURE:  Left-sided L5-S1 microdiskectomy.  SURGEON:  Estill Bamberg, MD  ASSISTANT:  None.  ANESTHESIA:  General endotracheal anesthesia.  COMPLICATIONS:  None.  DISPOSITION:  Stable.  ESTIMATED BLOOD LOSS:  Minimal.  INDICATIONS FOR PROCEDURE:  Briefly, Mr. Keithly is a pleasant 66 year old male who presented to me with severe debilitating pain in his left leg.  An MRI was consistent with a large left-sided L5-S1 disk herniation causing compression of the S1 and S2 nerves.  The patient continued to have severe and debilitating pain and the patient did elect to go forward with surgical intervention.  The patient was fully understood the risks and limitations of the surgery.  OPERATIVE DETAILS:  On November 01, 2011, the patient was brought to Surgery and general endotracheal anesthesia was administered.  The patient was placed prone on a well-padded Jackson bed with a Wilson frame.  All bony prominences were meticulously padded.  Antibiotics were given.  A time- out procedure was performed.  I then made an incision overlying the L5- S1 interspace.  A curvilinear incision was made in the fascia on the left side.  The paraspinal muscular was bluntly swept laterally.  I then obtained a lateral intraoperative radiograph with a curette beneath the L5 lamina.  I then proceeded with a laminotomy.  I did go onto remove the ligamentum flavum.  The traversing S1 and S2 nerves were readily identified and there was a very large extruded disk fragment readily located  between the S1 and S2 nerves.  This was removed in one large fragment.  I then controlled all epidural bleeding.  The wound was then copiously irrigated.  I then closed the fascia using #1 Vicryl.  The subcutaneous layer was closed using 2-0 Vicryl and the skin was closed using 4-0 Monocryl.  All instrument counts were correct at the termination of the procedure.     Estill Bamberg, MD     MD/MEDQ  D:  11/01/2011  T:  11/02/2011  Job:  [id number]

## 2011-11-03 NOTE — Discharge Summary (Signed)
NAMEJAFARI, Dean Brown NO.:  [id number]  MEDICAL RECORD NO.:  [id number]  LOCATION:  5N08C                        FACILITY:  [facility]  PHYSICIAN:  Estill Bamberg, MD      DATE OF BIRTH:  [birth year]  DATE OF ADMISSION:  11/01/2011 DATE OF DISCHARGE:  11/02/2011                              DISCHARGE SUMMARY   ADMISSION DIAGNOSES: 1. Left-sided S1 radiculopathy. 2. Left-sided L5-S1 disk herniation.  DISCHARGE DIAGNOSES: 1. Left-sided S1 radiculopathy. 2. Left-sided L5-S1 disk herniation.  INDICATIONS FOR PROCEDURE:  Briefly, Mr. Boys is a very pleasant 66- year-old male who presented to my office with severe pain in the left leg.  MRI was clearly consistent with a left-sided L5-S1 disk herniation.  The patient was admitted for surgical intervention.  HOSPITAL COURSE:  On November 01, 2011, the patient was brought to surgery and underwent a microdiskectomy.  The patient was admitted overnight for observation and was evaluated by me in the next morning.  His left leg pain was resolved.  He did have some minimal pain in the low back, as expected.  The patient was uneventfully discharged home on the morning of postoperative day #1.  DISCHARGE INSTRUCTIONS:  The patient will take Percocet for pain and Valium for spasms.  He will adhere to back precautions at all times.  He will follow up in my office in approximately 2 weeks after his procedure.     Estill Bamberg, MD     MD/MEDQ  D:  11/02/2011  T:  11/03/2011  Job:  [id number]

## 2012-03-08 DIAGNOSIS — E785 Hyperlipidemia, unspecified: Secondary | ICD-10-CM | POA: Diagnosis present

## 2012-10-22 HISTORY — PX: KNEE SURGERY: SHX244

## 2012-11-06 ENCOUNTER — Encounter (HOSPITAL_COMMUNITY): Payer: Self-pay | Admitting: Emergency Medicine

## 2012-11-06 ENCOUNTER — Emergency Department (HOSPITAL_COMMUNITY)
Admission: EM | Admit: 2012-11-06 | Discharge: 2012-11-06 | Disposition: A | Discharge status: Home / Self Care | Payer: Medicare Other | Attending: Emergency Medicine | Admitting: Emergency Medicine

## 2012-11-06 ENCOUNTER — Emergency Department (HOSPITAL_COMMUNITY): Payer: Medicare Other

## 2012-11-06 DIAGNOSIS — M5430 Sciatica, unspecified side: Secondary | ICD-10-CM | POA: Insufficient documentation

## 2012-11-06 DIAGNOSIS — Z794 Long term (current) use of insulin: Secondary | ICD-10-CM | POA: Insufficient documentation

## 2012-11-06 DIAGNOSIS — G4730 Sleep apnea, unspecified: Secondary | ICD-10-CM | POA: Insufficient documentation

## 2012-11-06 DIAGNOSIS — Z79899 Other long term (current) drug therapy: Secondary | ICD-10-CM | POA: Insufficient documentation

## 2012-11-06 DIAGNOSIS — M5431 Sciatica, right side: Secondary | ICD-10-CM

## 2012-11-06 DIAGNOSIS — Z7982 Long term (current) use of aspirin: Secondary | ICD-10-CM | POA: Insufficient documentation

## 2012-11-06 DIAGNOSIS — K219 Gastro-esophageal reflux disease without esophagitis: Secondary | ICD-10-CM | POA: Insufficient documentation

## 2012-11-06 DIAGNOSIS — Z8546 Personal history of malignant neoplasm of prostate: Secondary | ICD-10-CM | POA: Insufficient documentation

## 2012-11-06 DIAGNOSIS — I10 Essential (primary) hypertension: Secondary | ICD-10-CM | POA: Insufficient documentation

## 2012-11-06 DIAGNOSIS — M129 Arthropathy, unspecified: Secondary | ICD-10-CM | POA: Insufficient documentation

## 2012-11-06 DIAGNOSIS — Z87891 Personal history of nicotine dependence: Secondary | ICD-10-CM | POA: Insufficient documentation

## 2012-11-06 DIAGNOSIS — M545 Low back pain, unspecified: Secondary | ICD-10-CM | POA: Insufficient documentation

## 2012-11-06 DIAGNOSIS — E119 Type 2 diabetes mellitus without complications: Secondary | ICD-10-CM | POA: Insufficient documentation

## 2012-11-06 HISTORY — DX: Unspecified osteoarthritis, unspecified site: M19.90

## 2012-11-06 MED ORDER — HYDROMORPHONE HCL PF 1 MG/ML IJ SOLN
1.0000 mg | Freq: Once | INTRAMUSCULAR | Status: AC
  Administered 2012-11-06: 1 mg via INTRAMUSCULAR
  Filled 2012-11-06: qty 1

## 2012-11-06 MED ORDER — OXYCODONE-ACETAMINOPHEN 5-325 MG PO TABS: 1.0000 | ORAL_TABLET | ORAL | Status: DC | PRN

## 2012-11-06 NOTE — ED Notes (Signed)
Pt has ride home.

## 2012-11-06 NOTE — Progress Notes (Signed)
Orthopedic Tech Progress Note Patient Details:  Dean Brown 1946-04-15 [id number]  Ortho Devices Type of Ortho Device: Knee Immobilizer Ortho Device/Splint Location: right leg Ortho Device/Splint Interventions: Application   Nikki Dom 11/06/2012, 9:25 PM

## 2012-11-06 NOTE — ED Notes (Signed)
PT. REPORTS RIGHT HIP / LOW BACK PAIN ONSET YESTERDAY , DENIES INJURY / USES CRUTCHES TO AMBULATE , HISTORY OF BACK SURGERY AND RIGHT KNEE SURGERY LAST 10/31/2012.

## 2012-11-06 NOTE — ED Provider Notes (Signed)
History    CSN: [id number] Arrival date & time 11/06/12  1916  First MD Initiated Contact with Patient 11/06/12 2008     Chief Complaint  Patient presents with  . Hip Pain   (Consider location/radiation/quality/duration/timing/severity/associated sxs/prior Treatment) HPI History provided by pt.   Pt c/o low back, right hip/buttock pain since yesterday.  6d s/p right knee surgery.  Pain does not radiate.  Aggravated by ROM and applying pressure.  No relief w/ vicodin. No associated fever, skin changes, RLE weakness/paresthesias, bowel/bladder dysfunction, abdominal pain.  Has h/o lumbar microdiscectomy in 10/2011, and current pain feels very similar to the sciatica he was experiencing prior to surgery.  Has had mild low back ever since, but now acutely worsened.  Denies trauma.  Past Medical History  Diagnosis Date  . Hypertension   . Diabetes mellitus   . Sleep apnea     cpap sleep study - [facility]  . GERD (gastroesophageal reflux disease)   . Cancer     Prostate cancer  . Arthritis    Past Surgical History  Procedure Laterality Date  . Rotator cuff repair      left shoulder  . Prostate surgery      s/p cancer  . Microdiskectomy  11/01/2011  . Lumbar laminectomy/decompression microdiscectomy  11/01/2011    Procedure: LUMBAR LAMINECTOMY/DECOMPRESSION MICRODISCECTOMY;  Surgeon: Emilee Hero, MD;  Location: [facility] OR;  Service: Orthopedics;  Laterality: Left;  Left sided lumbar 5- sacrum 1 microdisectomy   No family history on file. History  Substance Use Topics  . Smoking status: Former Smoker    Quit date: 05/04/1993  . Smokeless tobacco: Never Used  . Alcohol Use: Yes     Comment: ocassional    Review of Systems  All other systems reviewed and are negative.    Allergies  Metformin and related  Home Medications   Current Outpatient Rx  Name  Route  Sig  Dispense  Refill  . amLODipine (NORVASC) 10 MG tablet   Oral   Take 5 mg by mouth daily.          Marland Kitchen  aspirin 81 MG chewable tablet   Oral   Chew 81 mg by mouth daily.         Marland Kitchen atorvastatin (LIPITOR) 40 MG tablet   Oral   Take 40 mg by mouth daily.         . Diclofenac Sodium 3 % GEL   Transdermal   Place 1 application onto the skin 2 (two) times daily as needed (pain).         . fluticasone (FLONASE) 50 MCG/ACT nasal spray   Nasal   Place 2 sprays into the nose daily.         . furosemide (LASIX) 20 MG tablet   Oral   Take 20 mg by mouth daily.         Marland Kitchen glucagon (GLUCAGEN) 1 MG SOLR   Intravenous   Inject 1 mg into the vein once as needed (severely low blood sugar).         . insulin glargine (LANTUS) 100 UNIT/ML injection   Subcutaneous   Inject 12 Units into the skin at bedtime.          Marland Kitchen lisinopril (PRINIVIL,ZESTRIL) 40 MG tablet   Oral   Take 40 mg by mouth daily.         . methocarbamol (ROBAXIN) 500 MG tablet   Oral   Take 500 mg by mouth 4 (  four) times daily.         . naproxen (NAPROSYN) 500 MG tablet   Oral   Take 500 mg by mouth 2 (two) times daily with a meal.         . omeprazole (PRILOSEC) 20 MG capsule   Oral   Take 20 mg by mouth daily.          BP 156/97  Pulse 97  Temp(Src) 98.2 F (36.8 C) (Oral)  Resp 16  SpO2 98% Physical Exam  Nursing note and vitals reviewed. Constitutional: He is oriented to person, place, and time. He appears well-developed and well-nourished.  HENT:  Head: Normocephalic and atraumatic.  Eyes:  Normal appearance  Neck: Normal range of motion.  Cardiovascular: Normal rate and regular rhythm.   Pulmonary/Chest: Effort normal and breath sounds normal.  Genitourinary:  No CVA ttp  Musculoskeletal:  Mild mid-line and right lumbar soft tissue tenderness.  Tenderness R buttock and R groin as well.  Mild pain w/ passive flexion of hip.  Can not assess R patellar reflex d/t recent surgery.  Staples in place and surgical wound appears to be healing well w/out signs of infection. No saddle  anesthesia. Distal sensation intact.  2+ DP pulses.    Neurological: He is alert and oriented to person, place, and time.  Skin: Skin is warm and dry. No rash noted.  Psychiatric: He has a normal mood and affect. His behavior is normal.    ED Course  Procedures (including critical care time) Labs Reviewed - No data to display Dg Hip Complete Right  11/06/2012   *RADIOLOGY REPORT*  Clinical Data: Right hip pain.  No injury.  RIGHT HIP - COMPLETE 2+ VIEW  Comparison: Lumbar spine 10/13/2011  Findings: Pelvic bony ring is intact.  Again noted are multiple surgical clips in the pelvis.  The sacroiliac joints are symmetric. Subchondral sclerosis and mild degenerative changes in both hips. Right hip is located without acute fracture.  IMPRESSION: No acute bony abnormality to the pelvis or right hip.  Mild degenerative changes in both hips.   Original Report Authenticated By: Richarda Overlie, M.D.   1. Sciatica of right side     MDM  67yo M, 6d s/p right knee surgery, presents w/ non-traumatic, right hip/buttock and low back pain.  Had a microdiscectomy in 10/2011, has chronic, mild residual pain, and current pain feels like an acute exacerbation of that pain.  Afebrile, mild lumbar mid-line and soft tissue as well as R buttock/groin ttp, no NV deficits of LEs.  Low clinical suspicion for spinal abscess, central cord or cauda equina.  Will treat symptomatically and refer back to Dr. Yevette Edwards.  Pain improved w/ 1mg  IM dilaudid in ED.  Prescribed 20 percocet and recommended low dose NSAID, heat or ice and avoidance of aggravating activities.  He received a R knee immobilizer as well.  Return precautions discussed. 10:10 PM   Otilio Miu, PA-C 11/06/12 2210

## 2012-11-06 NOTE — ED Provider Notes (Signed)
Medical screening examination/treatment/procedure(s) were performed by non-physician practitioner and as supervising physician I was immediately available for consultation/collaboration.    Ashby Dawes, MD 11/06/12 [phone]

## 2012-11-06 NOTE — ED Notes (Signed)
Ortho paged. 

## 2012-11-06 NOTE — ED Notes (Signed)
Knee Immobilizer repositioned. Pt states that the strap was pressing on his incision.

## 2013-06-16 DIAGNOSIS — Z960 Presence of urogenital implants: Secondary | ICD-10-CM | POA: Insufficient documentation

## 2013-07-07 ENCOUNTER — Encounter (HOSPITAL_COMMUNITY): Payer: Self-pay | Admitting: Emergency Medicine

## 2013-07-07 ENCOUNTER — Emergency Department (HOSPITAL_COMMUNITY): Payer: Medicare Other

## 2013-07-07 ENCOUNTER — Emergency Department (HOSPITAL_COMMUNITY)
Admission: EM | Admit: 2013-07-07 | Discharge: 2013-07-07 | Disposition: A | Discharge status: Home / Self Care | Payer: Medicare Other | Attending: Emergency Medicine | Admitting: Emergency Medicine

## 2013-07-07 DIAGNOSIS — E119 Type 2 diabetes mellitus without complications: Secondary | ICD-10-CM | POA: Insufficient documentation

## 2013-07-07 DIAGNOSIS — Z791 Long term (current) use of non-steroidal anti-inflammatories (NSAID): Secondary | ICD-10-CM | POA: Insufficient documentation

## 2013-07-07 DIAGNOSIS — Z794 Long term (current) use of insulin: Secondary | ICD-10-CM | POA: Insufficient documentation

## 2013-07-07 DIAGNOSIS — I10 Essential (primary) hypertension: Secondary | ICD-10-CM | POA: Insufficient documentation

## 2013-07-07 DIAGNOSIS — G4730 Sleep apnea, unspecified: Secondary | ICD-10-CM | POA: Insufficient documentation

## 2013-07-07 DIAGNOSIS — Z79899 Other long term (current) drug therapy: Secondary | ICD-10-CM | POA: Insufficient documentation

## 2013-07-07 DIAGNOSIS — Z8546 Personal history of malignant neoplasm of prostate: Secondary | ICD-10-CM | POA: Insufficient documentation

## 2013-07-07 DIAGNOSIS — M129 Arthropathy, unspecified: Secondary | ICD-10-CM | POA: Insufficient documentation

## 2013-07-07 DIAGNOSIS — Z7982 Long term (current) use of aspirin: Secondary | ICD-10-CM | POA: Insufficient documentation

## 2013-07-07 DIAGNOSIS — K5289 Other specified noninfective gastroenteritis and colitis: Secondary | ICD-10-CM | POA: Insufficient documentation

## 2013-07-07 DIAGNOSIS — K529 Noninfective gastroenteritis and colitis, unspecified: Secondary | ICD-10-CM

## 2013-07-07 DIAGNOSIS — K219 Gastro-esophageal reflux disease without esophagitis: Secondary | ICD-10-CM | POA: Insufficient documentation

## 2013-07-07 DIAGNOSIS — Z87891 Personal history of nicotine dependence: Secondary | ICD-10-CM | POA: Insufficient documentation

## 2013-07-07 DIAGNOSIS — R0789 Other chest pain: Secondary | ICD-10-CM | POA: Insufficient documentation

## 2013-07-07 LAB — COMPREHENSIVE METABOLIC PANEL
ALT: 15 U/L (ref 0–53)
AST: 14 U/L (ref 0–37)
Albumin: 3.5 g/dL (ref 3.5–5.2)
Alkaline Phosphatase: 69 U/L (ref 39–117)
BUN: 16 mg/dL (ref 6–23)
CHLORIDE: 103 meq/L (ref 96–112)
CO2: 26 mEq/L (ref 19–32)
CREATININE: 0.98 mg/dL (ref 0.50–1.35)
Calcium: 9.1 mg/dL (ref 8.4–10.5)
GFR calc Af Amer: >90 mL/min (ref >90)
GFR calc non Af Amer: 84 mL/min — ABNORMAL LOW (ref >90)
Glucose, Bld: 179 mg/dL — ABNORMAL HIGH (ref 70–99)
Potassium: 4.7 mEq/L (ref 3.7–5.3)
SODIUM: 141 meq/L (ref 137–147)
Total Bilirubin: 0.6 mg/dL (ref 0.3–1.2)
Total Protein: 6.9 g/dL (ref 6.0–8.3)

## 2013-07-07 LAB — CBC WITH DIFFERENTIAL/PLATELET
BASOS ABS: 0 10*3/uL (ref 0.0–0.1)
Basophils Relative: 0 % (ref 0–1)
Eosinophils Absolute: 0.1 10*3/uL (ref 0.0–0.7)
Eosinophils Relative: 1 % (ref 0–5)
HCT: 42.3 % (ref 39.0–52.0)
Hemoglobin: 14.1 g/dL (ref 13.0–17.0)
Lymphocytes Relative: 7 % — ABNORMAL LOW (ref 12–46)
Lymphs Abs: 0.5 10*3/uL — ABNORMAL LOW (ref 0.7–4.0)
MCH: 30.8 pg (ref 26.0–34.0)
MCHC: 33.3 g/dL (ref 30.0–36.0)
MCV: 92.4 fL (ref 78.0–100.0)
MONO ABS: 0.3 10*3/uL (ref 0.1–1.0)
Monocytes Relative: 4 % (ref 3–12)
NEUTROS ABS: 6.5 10*3/uL (ref 1.7–7.7)
Neutrophils Relative %: 89 % — ABNORMAL HIGH (ref 43–77)
PLATELETS: 288 10*3/uL (ref 150–400)
RBC: 4.58 MIL/uL (ref 4.22–5.81)
RDW: 13.3 % (ref 11.5–15.5)
WBC: 7.4 10*3/uL (ref 4.0–10.5)

## 2013-07-07 LAB — LIPASE, BLOOD: Lipase: 19 U/L (ref 11–59)

## 2013-07-07 LAB — TROPONIN I

## 2013-07-07 MED ORDER — SODIUM CHLORIDE 0.9 % IV BOLUS (SEPSIS)
1000.0000 mL | Freq: Once | INTRAVENOUS | Status: AC
  Administered 2013-07-07: 1000 mL via INTRAVENOUS

## 2013-07-07 MED ORDER — ONDANSETRON HCL 4 MG/2ML IJ SOLN
4.0000 mg | Freq: Once | INTRAMUSCULAR | Status: AC
  Administered 2013-07-07: 4 mg via INTRAVENOUS
  Filled 2013-07-07: qty 2

## 2013-07-07 MED ORDER — ONDANSETRON 4 MG PO TBDP: ORAL_TABLET | ORAL | Status: DC

## 2013-07-07 MED ORDER — GI COCKTAIL ~~LOC~~
30.0000 mL | Freq: Once | ORAL | Status: AC
  Administered 2013-07-07: 30 mL via ORAL
  Filled 2013-07-07: qty 30

## 2013-07-07 NOTE — ED Notes (Signed)
Returned from xray

## 2013-07-07 NOTE — ED Notes (Signed)
Patient transported to X-ray 

## 2013-07-07 NOTE — ED Notes (Signed)
Pt sates that he feels better after the GI cocktail

## 2013-07-07 NOTE — Discharge Instructions (Signed)
Chest Pain (Nonspecific) °It is often hard to give a specific diagnosis for the cause of chest pain. There is always a chance that your pain could be related to something serious, such as a heart attack or a blood clot in the lungs. You need to follow up with your caregiver for further evaluation. °CAUSES  °· Heartburn. °· Pneumonia or bronchitis. °· Anxiety or stress. °· Inflammation around your heart (pericarditis) or lung (pleuritis or pleurisy). °· A blood clot in the lung. °· A collapsed lung (pneumothorax). It can develop suddenly on its own (spontaneous pneumothorax) or from injury (trauma) to the chest. °· Shingles infection (herpes zoster virus). °The chest wall is composed of bones, muscles, and cartilage. Any of these can be the source of the pain. °· The bones can be bruised by injury. °· The muscles or cartilage can be strained by coughing or overwork. °· The cartilage can be affected by inflammation and become sore (costochondritis). °DIAGNOSIS  °Lab tests or other studies, such as X-rays, electrocardiography, stress testing, or cardiac imaging, may be needed to find the cause of your pain.  °TREATMENT  °· Treatment depends on what may be causing your chest pain. Treatment may include: °· Acid blockers for heartburn. °· Anti-inflammatory medicine. °· Pain medicine for inflammatory conditions. °· Antibiotics if an infection is present. °· You may be advised to change lifestyle habits. This includes stopping smoking and avoiding alcohol, caffeine, and chocolate. °· You may be advised to keep your head raised (elevated) when sleeping. This reduces the chance of acid going backward from your stomach into your esophagus. °· Most of the time, nonspecific chest pain will improve within 2 to 3 days with rest and mild pain medicine. °HOME CARE INSTRUCTIONS  °· If antibiotics were prescribed, take your antibiotics as directed. Finish them even if you start to feel better. °· For the next few days, avoid physical  activities that bring on chest pain. Continue physical activities as directed. °· Do not smoke. °· Avoid drinking alcohol. °· Only take over-the-counter or prescription medicine for pain, discomfort, or fever as directed by your caregiver. °· Follow your caregiver's suggestions for further testing if your chest pain does not go away. °· Keep any follow-up appointments you made. If you do not go to an appointment, you could develop lasting (chronic) problems with pain. If there is any problem keeping an appointment, you must call to reschedule. °SEEK MEDICAL CARE IF:  °· You think you are having problems from the medicine you are taking. Read your medicine instructions carefully. °· Your chest pain does not go away, even after treatment. °· You develop a rash with blisters on your chest. °SEEK IMMEDIATE MEDICAL CARE IF:  °· You have increased chest pain or pain that spreads to your arm, neck, jaw, back, or abdomen. °· You develop shortness of breath, an increasing cough, or you are coughing up blood. °· You have severe back or abdominal pain, feel nauseous, or vomit. °· You develop severe weakness, fainting, or chills. °· You have a fever. °THIS IS AN EMERGENCY. Do not wait to see if the pain will go away. Get medical help at once. Call your local emergency services (911 in [place] Do not drive yourself to the hospital. °MAKE SURE YOU:  °· Understand these instructions. °· Will watch your condition. °· Will get help right away if you are not doing well or get worse. °Document Released: 01/18/2005 Document Revised: 07/03/2011 Document Reviewed: 11/14/2007 °ExitCare® Patient Information ©2014 [employer]   [employer].  Viral Gastroenteritis Viral gastroenteritis is also called stomach flu. This illness is caused by a certain type of germ (virus). It can cause sudden watery poop (diarrhea) and throwing up (vomiting). This can cause you to lose body fluids (dehydration). This illness usually lasts for 3 to 8 days. It usually goes  away on its own. HOME CARE   Drink enough fluids to keep your pee (urine) clear or pale yellow. Drink small amounts of fluids often.  Ask your doctor how to replace body fluid losses (rehydration).  Avoid:  Foods high in sugar.  Alcohol.  Bubbly (carbonated) drinks.  Tobacco.  Juice.  Caffeine drinks.  Very hot or cold fluids.  Fatty, greasy foods.  Eating too much at one time.  Dairy products until 24 to 48 hours after your watery poop stops.  You may eat foods with active cultures (probiotics). They can be found in some yogurts and supplements.  Wash your hands well to avoid spreading the illness.  Only take medicines as told by your doctor. Do not give aspirin to children. Do not take medicines for watery poop (antidiarrheals).  Ask your doctor if you should keep taking your regular medicines.  Keep all doctor visits as told. GET HELP RIGHT AWAY IF:   You cannot keep fluids down.  You do not pee at least once every 6 to 8 hours.  You are short of breath.  You see blood in your poop or throw up. This may look like coffee grounds.  You have belly (abdominal) pain that gets worse or is just in one small spot (localized).  You keep throwing up or having watery poop.  You have a fever.  The patient is a child younger than 3 months, and he or she has a fever.  The patient is a child older than 3 months, and he or she has a fever and problems that do not go away.  The patient is a child older than 3 months, and he or she has a fever and problems that suddenly get worse.  The patient is a baby, and he or she has no tears when crying. MAKE SURE YOU:   Understand these instructions.  Will watch your condition.  Will get help right away if you are not doing well or get worse. Document Released: 09/27/2007 Document Revised: 07/03/2011 Document Reviewed: 01/25/2011 [facility] Patient Information 2014 [employer].

## 2013-07-07 NOTE — ED Provider Notes (Signed)
CSN: [id number]     Arrival date & time 07/07/13  0211 History   First MD Initiated Contact with Patient 07/07/13 0249     Chief Complaint  Patient presents with  . Emesis  . Chest Pain     (Consider location/radiation/quality/duration/timing/severity/associated sxs/prior Treatment) HPI Patient has a history of gastroesophageal reflux disease and presents with multiple episodes of vomiting and diarrhea starting at 8:30 yesterday evening. He last ate breakfast yesterday morning. He's had no sick contacts. He denies any blood in the vomit or stool. He complains of burning pain extending from his epigastrium up through the center of his chest into the back of his throat. He states he has an acidic and metallic taste in the back of his mouth. He complains of ongoing nausea. Patient states he was having difficulty sleeping which prompted his visit at this hour. He denies any lower abdominal pain. Past Medical History  Diagnosis Date  . Hypertension   . Diabetes mellitus   . Sleep apnea     cpap sleep study - [facility]  . GERD (gastroesophageal reflux disease)   . Cancer     Prostate cancer  . Arthritis    Past Surgical History  Procedure Laterality Date  . Rotator cuff repair      left shoulder  . Prostate surgery      s/p cancer  . Microdiskectomy  11/01/2011  . Lumbar laminectomy/decompression microdiscectomy  11/01/2011    Procedure: LUMBAR LAMINECTOMY/DECOMPRESSION MICRODISCECTOMY;  Surgeon: Sinclair Ship, MD;  Location: [facility];  Service: Orthopedics;  Laterality: Left;  Left sided lumbar 5- sacrum 1 microdisectomy   No family history on file. History  Substance Use Topics  . Smoking status: Former Smoker    Quit date: 05/04/1993  . Smokeless tobacco: Never Used  . Alcohol Use: Yes     Comment: ocassional    Review of Systems  Constitutional: Negative for fever and chills.  Respiratory: Negative for cough and shortness of breath.   Cardiovascular: Positive for chest  pain. Negative for palpitations and leg swelling.  Gastrointestinal: Positive for nausea, vomiting, abdominal pain and diarrhea. Negative for constipation and blood in stool.  Musculoskeletal: Negative for back pain, myalgias, neck pain and neck stiffness.  Skin: Negative for rash and wound.  Neurological: Negative for dizziness, weakness, light-headedness, numbness and headaches.  All other systems reviewed and are negative.      Allergies  Review of patient's allergies indicates no active allergies.  Home Medications   Current Outpatient Rx  Name  Route  Sig  Dispense  Refill  . amLODipine (NORVASC) 10 MG tablet   Oral   Take 10 mg by mouth daily.         Marland Kitchen aspirin 81 MG chewable tablet   Oral   Chew 81 mg by mouth daily.         Marland Kitchen atorvastatin (LIPITOR) 80 MG tablet   Oral   Take 80 mg by mouth daily.         Marland Kitchen azelastine (ASTELIN) 137 MCG/SPRAY nasal spray   Each Nare   Place 1 spray into both nostrils 2 (two) times daily. Use in each nostril as directed         . clotrimazole-betamethasone (LOTRISONE) cream   Topical   Apply 1 application topically 2 (two) times daily.         . furosemide (LASIX) 20 MG tablet   Oral   Take 20 mg by mouth daily as needed for edema.         Marland Kitchen  insulin glargine (LANTUS) 100 UNIT/ML injection   Subcutaneous   Inject 22 Units into the skin at bedtime.         Marland Kitchen lisinopril (PRINIVIL,ZESTRIL) 40 MG tablet   Oral   Take 40 mg by mouth daily.         . metFORMIN (GLUCOPHAGE-XR) 500 MG 24 hr tablet   Oral   Take 500 mg by mouth daily with breakfast.         . naproxen (NAPROSYN) 500 MG tablet   Oral   Take 500 mg by mouth 2 (two) times daily with a meal.         . omeprazole (PRILOSEC) 20 MG capsule   Oral   Take 20 mg by mouth daily.         . traMADol (ULTRAM) 50 MG tablet   Oral   Take 50 mg by mouth every 6 (six) hours as needed for moderate pain.         Marland Kitchen glucagon (GLUCAGEN) 1 MG SOLR    Intravenous   Inject 1 mg into the vein once as needed (severely low blood sugar).          BP 151/83  Pulse 95  Temp(Src) 98.9 F (37.2 C) (Oral)  Resp 18  Ht 5\' 5"  (1.651 m)  Wt 176 lb (79.833 kg)  BMI 29.29 kg/m2  SpO2 97% Physical Exam  Nursing note and vitals reviewed. Constitutional: He is oriented to person, place, and time. He appears well-developed and well-nourished. No distress.  HENT:  Head: Normocephalic and atraumatic.  Mouth/Throat: Oropharynx is clear and moist.  Eyes: EOM are normal. Pupils are equal, round, and reactive to light.  Neck: Normal range of motion. Neck supple.  Cardiovascular: Normal rate and regular rhythm.   Pulmonary/Chest: Effort normal and breath sounds normal. No respiratory distress. He has no wheezes. He has no rales. He exhibits no tenderness.  Abdominal: Soft. Bowel sounds are normal. He exhibits no distension and no mass. There is tenderness (tenderness to palpation over the patient's epigastrium). There is no rebound and no guarding.  Musculoskeletal: Normal range of motion. He exhibits no edema and no tenderness.  No calf swelling or tenderness.  Neurological: He is alert and oriented to person, place, and time.  Moves all extremities without deficit. Sensation is grossly intact.  Skin: Skin is warm and dry. No rash noted. No erythema.  Psychiatric: He has a normal mood and affect. His behavior is normal.    ED Course  Procedures (including critical care time) Labs Review Labs Reviewed  CBC WITH DIFFERENTIAL  COMPREHENSIVE METABOLIC PANEL  LIPASE, BLOOD  TROPONIN I   Imaging Review No results found.   EKG Interpretation   Date/Time:  Monday July 07 2013 02:27:08 EDT Ventricular Rate:  99 PR Interval:  126 QRS Duration: 79 QT Interval:  336 QTC Calculation: 431 R Axis:   -25 Text Interpretation:  Sinus rhythm LAE, consider biatrial enlargement  Borderline left axis deviation Abnormal R-wave progression, early   transition Borderline T abnormalities, lateral leads Confirmed by  Khamya Topp  MD, Amena Dockham ([id number]) on 07/07/2013 3:40:57 AM     Similar T-wave abnormality seen on 2008 and 2001 EKG MDM   Final diagnoses:  None   Patient's chest pain appears to be related to his gastrointestinal presentation. Question viral gastroenteritis versus food borne illness. We'll screen with troponin. EKG has a similar appearance to the that seen in 2008. Will treat symptomatically and reevaluate.   Patient states  he is feeling much better after GI cocktail. He is resting comfortably. Troponin is normal. I have very low suspicion for coronary artery disease as the cause of his symptoms. He has been adequately screened and I find no emergent medical condition. Return precautions have been given and the patient is voiced understanding.  Julianne Rice, MD 07/07/13 [phone]

## 2013-07-07 NOTE — ED Notes (Signed)
Pt reports that he began vomiting and having diarrhea at 20:30 yesterday. Pt also reports chest burning since that time. NAD at this time.

## 2013-12-01 ENCOUNTER — Emergency Department (INDEPENDENT_AMBULATORY_CARE_PROVIDER_SITE_OTHER): Payer: BC Managed Care – PPO

## 2013-12-01 ENCOUNTER — Encounter (HOSPITAL_COMMUNITY): Payer: Self-pay | Admitting: Emergency Medicine

## 2013-12-01 ENCOUNTER — Emergency Department (INDEPENDENT_AMBULATORY_CARE_PROVIDER_SITE_OTHER)
Admission: EM | Admit: 2013-12-01 | Discharge: 2013-12-01 | Disposition: A | Discharge status: Home / Self Care | Payer: Medicare Other | Source: Home / Self Care | Attending: Emergency Medicine | Admitting: Emergency Medicine

## 2013-12-01 ENCOUNTER — Emergency Department (HOSPITAL_COMMUNITY): Payer: TRICARE For Life (TFL)

## 2013-12-01 DIAGNOSIS — J189 Pneumonia, unspecified organism: Secondary | ICD-10-CM

## 2013-12-01 LAB — CBC WITH DIFFERENTIAL/PLATELET
Basophils Absolute: 0 10*3/uL (ref 0.0–0.1)
Basophils Relative: 0 % (ref 0–1)
Eosinophils Absolute: 0.1 10*3/uL (ref 0.0–0.7)
Eosinophils Relative: 1 % (ref 0–5)
HEMATOCRIT: 41.7 % (ref 39.0–52.0)
Hemoglobin: 13.6 g/dL (ref 13.0–17.0)
LYMPHS ABS: 1.8 10*3/uL (ref 0.7–4.0)
LYMPHS PCT: 23 % (ref 12–46)
MCH: 29.8 pg (ref 26.0–34.0)
MCHC: 32.6 g/dL (ref 30.0–36.0)
MCV: 91.2 fL (ref 78.0–100.0)
MONO ABS: 0.9 10*3/uL (ref 0.1–1.0)
MONOS PCT: 12 % (ref 3–12)
NEUTROS ABS: 5.1 10*3/uL (ref 1.7–7.7)
Neutrophils Relative %: 64 % (ref 43–77)
Platelets: 224 10*3/uL (ref 150–400)
RBC: 4.57 MIL/uL (ref 4.22–5.81)
RDW: 13.5 % (ref 11.5–15.5)
WBC: 8 10*3/uL (ref 4.0–10.5)

## 2013-12-01 LAB — POCT URINALYSIS DIP (DEVICE)
Glucose, UA: 100 mg/dL — AB
Leukocytes, UA: NEGATIVE
Nitrite: NEGATIVE
PROTEIN: 30 mg/dL — AB
SPECIFIC GRAVITY, URINE: 1.015 (ref 1.005–1.030)
UROBILINOGEN UA: 0.2 mg/dL (ref 0.0–1.0)
pH: 6 (ref 5.0–8.0)

## 2013-12-01 LAB — POCT I-STAT, CHEM 8
BUN: 8 mg/dL (ref 6–23)
Calcium, Ion: 1.12 mmol/L — ABNORMAL LOW (ref 1.13–1.30)
Chloride: 103 mEq/L (ref 96–112)
Creatinine, Ser: 0.9 mg/dL (ref 0.50–1.35)
Glucose, Bld: 123 mg/dL — ABNORMAL HIGH (ref 70–99)
HCT: 46 % (ref 39.0–52.0)
HEMOGLOBIN: 15.6 g/dL (ref 13.0–17.0)
Potassium: 3.4 mEq/L — ABNORMAL LOW (ref 3.7–5.3)
Sodium: 139 mEq/L (ref 137–147)
TCO2: 25 mmol/L (ref 0–100)

## 2013-12-01 MED ORDER — CEFDINIR 300 MG PO CAPS: 300.0000 mg | ORAL_CAPSULE | Freq: Two times a day (BID) | ORAL | Status: DC

## 2013-12-01 MED ORDER — CEFTRIAXONE SODIUM 1 G IJ SOLR
1.0000 g | Freq: Once | INTRAMUSCULAR | Status: AC
  Administered 2013-12-01: 1 g via INTRAMUSCULAR

## 2013-12-01 MED ORDER — AZITHROMYCIN 250 MG PO TABS: ORAL_TABLET | ORAL | Status: DC

## 2013-12-01 MED ORDER — HYDROCODONE-ACETAMINOPHEN 5-325 MG PO TABS: ORAL_TABLET | ORAL | Status: DC

## 2013-12-01 MED ORDER — CEFTRIAXONE SODIUM 1 G IJ SOLR
INTRAMUSCULAR | Status: AC
  Filled 2013-12-01: qty 10

## 2013-12-01 NOTE — ED Notes (Signed)
MD evaluation only 

## 2013-12-01 NOTE — Discharge Instructions (Signed)

## 2013-12-01 NOTE — ED Provider Notes (Signed)
Chief Complaint   Chief Complaint  Patient presents with  . Fever    History of Present Illness   Dean Brown is a 68 year old male with diabetes and a history of prostate cancer who has had a three-day history of fever, chills, and myalgias. He's had some aching in his neck, his back, and both knees. He's also noted some urinary frequency. He denies any headache, eye symptoms, stuffy nose, rhinorrhea, sore throat, or oral lesions. He's had no adenopathy. He denies any coughing, wheezing, shortness of breath, or chest pain. He's had no bowel pain, nausea, vomiting, or diarrhea. No blood in the stools. He denies any dysuria, urgency, or hematuria. He is no swelling of his testes. He denies any extremity swelling or skin rash. He's had no tick bites or any other bites. He denies any foreign travel, sick exposures, or animal exposures.  Review of Systems   Other than as noted above, the patient denies any of the following symptoms. Systemic:  No sweats, fatigue, myalgias, headache, weight loss or anorexia. Eye:  No redness or drainage. ENT:  No earache, nasal congestion, rhinorrhea, sinus pressure, or sore throat. No adenopathy or stiff neck. Lungs:  No cough, sputum production, wheezing, shortness of breath.  Cardiovascular:  No chest pain. GI:  No nausea, vomiting, abdominal pain or diarrhea. GU:  No dysuria, frequency, or hematuria. Skin:  No rash.  [place]   Past medical history, family history, social history, meds, and allergies were reviewed. He has no medication allergies. He currently takes metformin, lisinopril, Lantus insulin, amlodipine, and Lipitor. He has diabetes, hypertension, hyperlipidemia, history of prostate cancer, and degenerative disease and bilateral knee osteoarthritis.  Physical Examination     Vital signs:  BP 136/85  Pulse 83  Temp(Src) 100.5 F (38.1 C) (Oral)  Resp 16  SpO2 97% General:  Alert, in no distress. Eye:  PERRL, full EOMs.  Lids and  conjunctivas were normal. ENT:  TMs and canals were normal, without erythema or inflammation.  Nasal mucosa was clear and uncongested, without drainage.  Mucous membranes were moist.  Pharynx was clear, without exudate or drainage.  There were no oral ulcerations or lesions. Neck:  Supple, no adenopathy, tenderness or mass. Thyroid was normal. Lungs:  No respiratory distress.  Lungs were clear to auscultation, without wheezes, rales or rhonchi.  Breath sounds were clear and equal bilaterally. Heart:  Regular rhythm, without gallops, murmers or rubs. Abdomen:  Soft, flat, and non-tender to palpation.  No hepatosplenomagaly or mass. Genital exam: Testes are nontender and not swollen. There is no evidence of Fournier's gangrene. Extremities:  No swelling, erythema, or joint pain to palpation. Skin:  Clear, warm, and dry, without rash or lesions.  Labs   Results for orders placed during the hospital encounter of 12/01/13  CBC WITH DIFFERENTIAL      Result Value Ref Range   WBC 8.0  4.0 - 10.5 K/uL   RBC 4.57  4.22 - 5.81 MIL/uL   Hemoglobin 13.6  13.0 - 17.0 g/dL   HCT 41.7  39.0 - 52.0 %   MCV 91.2  78.0 - 100.0 fL   MCH 29.8  26.0 - 34.0 pg   MCHC 32.6  30.0 - 36.0 g/dL   RDW 13.5  11.5 - 15.5 %   Platelets 224  150 - 400 K/uL   Neutrophils Relative % 64  43 - 77 %   Neutro Abs 5.1  1.7 - 7.7 K/uL   Lymphocytes Relative 23  12 - 46 %   Lymphs Abs 1.8  0.7 - 4.0 K/uL   Monocytes Relative 12  3 - 12 %   Monocytes Absolute 0.9  0.1 - 1.0 K/uL   Eosinophils Relative 1  0 - 5 %   Eosinophils Absolute 0.1  0.0 - 0.7 K/uL   Basophils Relative 0  0 - 1 %   Basophils Absolute 0.0  0.0 - 0.1 K/uL  POCT URINALYSIS DIP (DEVICE)      Result Value Ref Range   Glucose, UA 100 (*) NEGATIVE mg/dL   Bilirubin Urine SMALL (*) NEGATIVE   Ketones, ur TRACE (*) NEGATIVE mg/dL   Specific Gravity, Urine 1.015  1.005 - 1.030   Hgb urine dipstick TRACE (*) NEGATIVE   pH 6.0  5.0 - 8.0   Protein, ur 30  (*) NEGATIVE mg/dL   Urobilinogen, UA 0.2  0.0 - 1.0 mg/dL   Nitrite NEGATIVE  NEGATIVE   Leukocytes, UA NEGATIVE  NEGATIVE  POCT I-STAT, CHEM 8      Result Value Ref Range   Sodium 139  137 - 147 mEq/L   Potassium 3.4 (*) 3.7 - 5.3 mEq/L   Chloride 103  96 - 112 mEq/L   BUN 8  6 - 23 mg/dL   Creatinine, Ser 0.90  0.50 - 1.35 mg/dL   Glucose, Bld 123 (*) 70 - 99 mg/dL   Calcium, Ion 1.12 (*) 1.13 - 1.30 mmol/L   TCO2 25  0 - 100 mmol/L   Hemoglobin 15.6  13.0 - 17.0 g/dL   HCT 46.0  39.0 - 52.0 %    A [place] spotted fever titer was also obtained.  Radiology   Dg Chest 2 View  12/01/2013   CLINICAL DATA:  Chills and body aches for 3 days. History of smoking.  EXAM: CHEST  2 VIEW  COMPARISON:  Chest radiograph from 07/07/2013  FINDINGS: The lungs are well-aerated. Rounded opacity at the left lower lobe is concerning for pneumonia. There is no evidence of pleural effusion or pneumothorax.  The heart is normal in size; the mediastinal contour is within normal limits. No acute osseous abnormalities are seen.  IMPRESSION: Rounded opacity at the left lower lobe, concerning for pneumonia. Would perform follow-up chest radiograph after completion of treatment for pneumonia, to ensure resolution of airspace opacity.   Electronically Signed   By: Garald Balding M.D.   On: 12/01/2013 21:26    Course in [facility]   Given Rocephin 1 g IM.  Assessment   The encounter diagnosis was Community acquired pneumonia.  Plan   1.  Meds:  The following meds were prescribed:   New Prescriptions   AZITHROMYCIN (ZITHROMAX Z-PAK) 250 MG TABLET    Take as directed.   CEFDINIR (OMNICEF) 300 MG CAPSULE    Take 1 capsule (300 mg total) by mouth 2 (two) times daily.   HYDROCODONE-ACETAMINOPHEN (NORCO/VICODIN) 5-325 MG PER TABLET    1 to 2 tabs every 4 to 6 hours as needed for pain.    2.  Patient Education/Counseling:  The patient was given appropriate handouts, self care instructions, and  instructed in symptomatic relief.  May use Tylenol or ibuprofen for fever. Advised rest and followup in 2 days either here or with his primary care doctor. He was also advised to have her repeat chest x-ray in one month.  3.  Follow up:  The patient was told to follow up here if no better in 2 to 3  days, or sooner if becoming worse in any way, and given some red flag symptoms such as increasing fever, severe headache or stiff neck, difficulty breathing, chest pain, abdominal pain, or persistent vomiting which would prompt immediate return.  Follow up here as necessary.     Harden Mo, MD 12/01/13 2230

## 2013-12-01 NOTE — ED Notes (Signed)
Home w wife to drive; will pick up medication at drug store

## 2013-12-02 LAB — ROCKY MTN SPOTTED FVR AB, IGM-BLOOD: RMSF IgM: 0.08 IV (ref 0.00–0.89)

## 2013-12-03 ENCOUNTER — Emergency Department (INDEPENDENT_AMBULATORY_CARE_PROVIDER_SITE_OTHER): Payer: Medicare Other

## 2013-12-03 ENCOUNTER — Encounter (HOSPITAL_COMMUNITY): Payer: Self-pay | Admitting: Emergency Medicine

## 2013-12-03 ENCOUNTER — Observation Stay (HOSPITAL_COMMUNITY)
Admission: EM | Admit: 2013-12-03 | Discharge: 2013-12-04 | Disposition: A | Discharge status: Home / Self Care | Payer: Medicare Other | Attending: Internal Medicine | Admitting: Internal Medicine

## 2013-12-03 ENCOUNTER — Emergency Department (INDEPENDENT_AMBULATORY_CARE_PROVIDER_SITE_OTHER)
Admission: EM | Admit: 2013-12-03 | Discharge: 2013-12-03 | Disposition: A | Discharge status: Other Acute Inpatient Hospital | Payer: Medicare Other | Source: Home / Self Care | Attending: Family Medicine | Admitting: Family Medicine

## 2013-12-03 DIAGNOSIS — M129 Arthropathy, unspecified: Secondary | ICD-10-CM | POA: Diagnosis not present

## 2013-12-03 DIAGNOSIS — Z8546 Personal history of malignant neoplasm of prostate: Secondary | ICD-10-CM | POA: Diagnosis not present

## 2013-12-03 DIAGNOSIS — I10 Essential (primary) hypertension: Secondary | ICD-10-CM | POA: Diagnosis present

## 2013-12-03 DIAGNOSIS — R509 Fever, unspecified: Secondary | ICD-10-CM | POA: Diagnosis present

## 2013-12-03 DIAGNOSIS — J159 Unspecified bacterial pneumonia: Secondary | ICD-10-CM | POA: Diagnosis not present

## 2013-12-03 DIAGNOSIS — Z794 Long term (current) use of insulin: Secondary | ICD-10-CM | POA: Insufficient documentation

## 2013-12-03 DIAGNOSIS — J189 Pneumonia, unspecified organism: Secondary | ICD-10-CM | POA: Diagnosis present

## 2013-12-03 DIAGNOSIS — G4733 Obstructive sleep apnea (adult) (pediatric): Secondary | ICD-10-CM | POA: Diagnosis present

## 2013-12-03 DIAGNOSIS — Z87891 Personal history of nicotine dependence: Secondary | ICD-10-CM | POA: Diagnosis not present

## 2013-12-03 DIAGNOSIS — Z79899 Other long term (current) drug therapy: Secondary | ICD-10-CM | POA: Diagnosis not present

## 2013-12-03 DIAGNOSIS — Z7982 Long term (current) use of aspirin: Secondary | ICD-10-CM | POA: Diagnosis not present

## 2013-12-03 DIAGNOSIS — R6510 Systemic inflammatory response syndrome (SIRS) of non-infectious origin without acute organ dysfunction: Secondary | ICD-10-CM | POA: Diagnosis present

## 2013-12-03 DIAGNOSIS — M1990 Unspecified osteoarthritis, unspecified site: Secondary | ICD-10-CM | POA: Insufficient documentation

## 2013-12-03 DIAGNOSIS — Z792 Long term (current) use of antibiotics: Secondary | ICD-10-CM | POA: Insufficient documentation

## 2013-12-03 DIAGNOSIS — E119 Type 2 diabetes mellitus without complications: Secondary | ICD-10-CM | POA: Diagnosis not present

## 2013-12-03 DIAGNOSIS — K219 Gastro-esophageal reflux disease without esophagitis: Secondary | ICD-10-CM | POA: Insufficient documentation

## 2013-12-03 DIAGNOSIS — E785 Hyperlipidemia, unspecified: Secondary | ICD-10-CM | POA: Diagnosis present

## 2013-12-03 HISTORY — DX: Pneumonia, unspecified organism: J18.9

## 2013-12-03 LAB — COMPREHENSIVE METABOLIC PANEL
ALBUMIN: 3.1 g/dL — AB (ref 3.5–5.2)
ALT: 36 U/L (ref 0–53)
AST: 31 U/L (ref 0–37)
Alkaline Phosphatase: 71 U/L (ref 39–117)
Anion gap: 14 (ref 5–15)
BUN: 10 mg/dL (ref 6–23)
CALCIUM: 8.6 mg/dL (ref 8.4–10.5)
CO2: 25 mEq/L (ref 19–32)
Chloride: 97 mEq/L (ref 96–112)
Creatinine, Ser: 0.93 mg/dL (ref 0.50–1.35)
GFR calc Af Amer: >90 mL/min (ref >90)
GFR calc non Af Amer: 86 mL/min — ABNORMAL LOW (ref >90)
Glucose, Bld: 142 mg/dL — ABNORMAL HIGH (ref 70–99)
POTASSIUM: 4 meq/L (ref 3.7–5.3)
SODIUM: 136 meq/L — AB (ref 137–147)
TOTAL PROTEIN: 7.2 g/dL (ref 6.0–8.3)
Total Bilirubin: 0.6 mg/dL (ref 0.3–1.2)

## 2013-12-03 LAB — CBC WITH DIFFERENTIAL/PLATELET
BASOS ABS: 0 10*3/uL (ref 0.0–0.1)
BASOS ABS: 0 10*3/uL (ref 0.0–0.1)
Basophils Relative: 0 % (ref 0–1)
Basophils Relative: 0 % (ref 0–1)
EOS ABS: 0 10*3/uL (ref 0.0–0.7)
Eosinophils Absolute: 0 10*3/uL (ref 0.0–0.7)
Eosinophils Relative: 0 % (ref 0–5)
Eosinophils Relative: 0 % (ref 0–5)
HCT: 41.8 % (ref 39.0–52.0)
HCT: 41.8 % (ref 39.0–52.0)
Hemoglobin: 13.7 g/dL (ref 13.0–17.0)
Hemoglobin: 13.9 g/dL (ref 13.0–17.0)
LYMPHS ABS: 0.8 10*3/uL (ref 0.7–4.0)
LYMPHS ABS: 0.9 10*3/uL (ref 0.7–4.0)
Lymphocytes Relative: 9 % — ABNORMAL LOW (ref 12–46)
Lymphocytes Relative: 10 % — ABNORMAL LOW (ref 12–46)
MCH: 30.5 pg (ref 26.0–34.0)
MCH: 30.6 pg (ref 26.0–34.0)
MCHC: 32.8 g/dL (ref 30.0–36.0)
MCHC: 33.3 g/dL (ref 30.0–36.0)
MCV: 93.1 fL (ref 78.0–100.0)
MCV: 92.1 fL (ref 78.0–100.0)
Monocytes Absolute: 1.1 10*3/uL — ABNORMAL HIGH (ref 0.1–1.0)
Monocytes Absolute: 1.1 10*3/uL — ABNORMAL HIGH (ref 0.1–1.0)
Monocytes Relative: 12 % (ref 3–12)
Monocytes Relative: 12 % (ref 3–12)
NEUTROS PCT: 79 % — AB (ref 43–77)
Neutro Abs: 7.4 10*3/uL (ref 1.7–7.7)
Neutro Abs: 6.9 10*3/uL (ref 1.7–7.7)
Neutrophils Relative %: 78 % — ABNORMAL HIGH (ref 43–77)
PLATELETS: 197 10*3/uL (ref 150–400)
PLATELETS: 183 10*3/uL (ref 150–400)
RBC: 4.49 MIL/uL (ref 4.22–5.81)
RBC: 4.54 MIL/uL (ref 4.22–5.81)
RDW: 13.7 % (ref 11.5–15.5)
RDW: 13.7 % (ref 11.5–15.5)
WBC: 9.3 10*3/uL (ref 4.0–10.5)
WBC: 8.8 10*3/uL (ref 4.0–10.5)

## 2013-12-03 LAB — POCT I-STAT, CHEM 8
BUN: 9 mg/dL (ref 6–23)
Calcium, Ion: 1.02 mmol/L — ABNORMAL LOW (ref 1.13–1.30)
Chloride: 103 mEq/L (ref 96–112)
Creatinine, Ser: 1.1 mg/dL (ref 0.50–1.35)
Glucose, Bld: 179 mg/dL — ABNORMAL HIGH (ref 70–99)
HEMATOCRIT: 44 % (ref 39.0–52.0)
Hemoglobin: 15 g/dL (ref 13.0–17.0)
POTASSIUM: 4 meq/L (ref 3.7–5.3)
SODIUM: 134 meq/L — AB (ref 137–147)
TCO2: 27 mmol/L (ref 0–100)

## 2013-12-03 LAB — URINE CULTURE
CULTURE: NO GROWTH
Colony Count: NO GROWTH

## 2013-12-03 LAB — STREP PNEUMONIAE URINARY ANTIGEN: Strep Pneumo Urinary Antigen: NEGATIVE

## 2013-12-03 LAB — GLUCOSE, CAPILLARY
GLUCOSE-CAPILLARY: 141 mg/dL — AB (ref 70–99)
Glucose-Capillary: 122 mg/dL — ABNORMAL HIGH (ref 70–99)

## 2013-12-03 MED ORDER — SODIUM CHLORIDE 0.9 % IV SOLN: 250.0000 mL | INTRAVENOUS | Status: DC | PRN

## 2013-12-03 MED ORDER — ACETAMINOPHEN 325 MG PO TABS
650.0000 mg | ORAL_TABLET | Freq: Once | ORAL | Status: AC
  Administered 2013-12-03: 650 mg via ORAL
  Filled 2013-12-03: qty 2

## 2013-12-03 MED ORDER — SODIUM CHLORIDE 0.9 % IJ SOLN
3.0000 mL | Freq: Two times a day (BID) | INTRAMUSCULAR | Status: DC
Start: 2013-12-03 — End: 2013-12-04

## 2013-12-03 MED ORDER — CEFTRIAXONE SODIUM 1 G IJ SOLR: 1.0000 g | Freq: Once | INTRAMUSCULAR | Status: DC

## 2013-12-03 MED ORDER — INSULIN GLARGINE 100 UNIT/ML ~~LOC~~ SOLN
14.0000 [IU] | Freq: Every day | SUBCUTANEOUS | Status: DC
Start: 2013-12-03 — End: 2013-12-04
  Administered 2013-12-03: 14 [IU] via SUBCUTANEOUS
  Filled 2013-12-03 (×2): qty 0.14

## 2013-12-03 MED ORDER — SODIUM CHLORIDE 0.9 % IJ SOLN: 3.0000 mL | INTRAMUSCULAR | Status: DC | PRN

## 2013-12-03 MED ORDER — SODIUM CHLORIDE 0.9 % IV BOLUS (SEPSIS)
500.0000 mL | Freq: Once | INTRAVENOUS | Status: AC
  Administered 2013-12-03: 500 mL via INTRAVENOUS

## 2013-12-03 MED ORDER — DEXTROSE 5 % IV SOLN
1.0000 g | Freq: Once | INTRAVENOUS | Status: AC
  Administered 2013-12-03: 1 g via INTRAVENOUS
  Filled 2013-12-03: qty 10

## 2013-12-03 MED ORDER — PANTOPRAZOLE SODIUM 40 MG PO TBEC
80.0000 mg | DELAYED_RELEASE_TABLET | Freq: Every day | ORAL | Status: DC
  Administered 2013-12-03 – 2013-12-04 (×2): 80 mg via ORAL
  Filled 2013-12-03 (×2): qty 2

## 2013-12-03 MED ORDER — DEXTROSE 5 % IV SOLN: 1.0000 g | INTRAVENOUS | Status: DC

## 2013-12-03 MED ORDER — ACETAMINOPHEN 325 MG PO TABS
650.0000 mg | ORAL_TABLET | Freq: Four times a day (QID) | ORAL | Status: DC | PRN
  Administered 2013-12-04: 650 mg via ORAL
  Filled 2013-12-03: qty 2

## 2013-12-03 MED ORDER — ONDANSETRON HCL 4 MG PO TABS: 4.0000 mg | ORAL_TABLET | Freq: Four times a day (QID) | ORAL | Status: DC | PRN

## 2013-12-03 MED ORDER — FUROSEMIDE 20 MG PO TABS
20.0000 mg | ORAL_TABLET | Freq: Every day | ORAL | Status: DC | PRN
  Filled 2013-12-03: qty 1

## 2013-12-03 MED ORDER — AZITHROMYCIN 500 MG PO TABS
500.0000 mg | ORAL_TABLET | ORAL | Status: DC
  Administered 2013-12-03: 500 mg via ORAL
  Filled 2013-12-03 (×2): qty 1

## 2013-12-03 MED ORDER — ASPIRIN 81 MG PO CHEW
81.0000 mg | CHEWABLE_TABLET | Freq: Every day | ORAL | Status: DC
  Administered 2013-12-03 – 2013-12-04 (×2): 81 mg via ORAL
  Filled 2013-12-03 (×2): qty 1

## 2013-12-03 MED ORDER — DEXTROSE 5 % IV SOLN
1.0000 g | INTRAVENOUS | Status: DC
  Filled 2013-12-03: qty 10

## 2013-12-03 MED ORDER — DEXTROSE 5 % IV SOLN: 500.0000 mg | Freq: Once | INTRAVENOUS | Status: DC

## 2013-12-03 MED ORDER — LEVOFLOXACIN 750 MG PO TABS: 750.0000 mg | ORAL_TABLET | Freq: Every day | ORAL | Status: DC

## 2013-12-03 MED ORDER — INSULIN ASPART 100 UNIT/ML ~~LOC~~ SOLN
0.0000 [IU] | Freq: Three times a day (TID) | SUBCUTANEOUS | Status: DC
  Administered 2013-12-03 – 2013-12-04 (×2): 1 [IU] via SUBCUTANEOUS

## 2013-12-03 MED ORDER — AZELASTINE HCL 0.1 % NA SOLN
1.0000 | Freq: Two times a day (BID) | NASAL | Status: DC | PRN
  Filled 2013-12-03: qty 30

## 2013-12-03 MED ORDER — ACETAMINOPHEN 650 MG RE SUPP: 650.0000 mg | Freq: Four times a day (QID) | RECTAL | Status: DC | PRN

## 2013-12-03 MED ORDER — ATORVASTATIN CALCIUM 80 MG PO TABS
80.0000 mg | ORAL_TABLET | Freq: Every day | ORAL | Status: DC
  Administered 2013-12-03: 80 mg via ORAL
  Filled 2013-12-03 (×2): qty 1

## 2013-12-03 MED ORDER — DEXTROSE 5 % IV SOLN
500.0000 mg | Freq: Once | INTRAVENOUS | Status: DC
  Filled 2013-12-03: qty 500

## 2013-12-03 MED ORDER — HEPARIN SODIUM (PORCINE) 5000 UNIT/ML IJ SOLN
5000.0000 [IU] | Freq: Three times a day (TID) | INTRAMUSCULAR | Status: DC
  Administered 2013-12-03 – 2013-12-04 (×3): 5000 [IU] via SUBCUTANEOUS
  Filled 2013-12-03 (×4): qty 1

## 2013-12-03 MED ORDER — AMLODIPINE BESYLATE 10 MG PO TABS
10.0000 mg | ORAL_TABLET | Freq: Every day | ORAL | Status: DC
  Administered 2013-12-03 – 2013-12-04 (×2): 10 mg via ORAL
  Filled 2013-12-03 (×2): qty 1

## 2013-12-03 MED ORDER — LISINOPRIL 40 MG PO TABS
40.0000 mg | ORAL_TABLET | Freq: Every day | ORAL | Status: DC
  Administered 2013-12-03 – 2013-12-04 (×2): 40 mg via ORAL
  Filled 2013-12-03 (×2): qty 1

## 2013-12-03 MED ORDER — ACETAMINOPHEN 325 MG PO TABS: 650.0000 mg | ORAL_TABLET | Freq: Once | ORAL | Status: DC

## 2013-12-03 MED ORDER — ONDANSETRON HCL 4 MG/2ML IJ SOLN
4.0000 mg | Freq: Four times a day (QID) | INTRAMUSCULAR | Status: DC | PRN
Start: 2013-12-03 — End: 2013-12-04

## 2013-12-03 MED ORDER — ALBUTEROL SULFATE (2.5 MG/3ML) 0.083% IN NEBU: 2.5000 mg | INHALATION_SOLUTION | RESPIRATORY_TRACT | Status: DC | PRN

## 2013-12-03 NOTE — ED Provider Notes (Signed)
CSN: [id number]     Arrival date & time 12/03/13  1033 History   First MD Initiated Contact with Patient 12/03/13 1156     Chief Complaint  Patient presents with  . Pneumonia     (Consider location/radiation/quality/duration/timing/severity/associated sxs/prior Treatment) Patient is a 68 y.o. male presenting with fever. The history is provided by the patient.  Fever Temp source:  Oral Severity:  Mild Onset quality:  Gradual Duration: 2-3 days. Timing:  Constant Progression:  Unchanged Chronicity:  New Relieved by:  Acetaminophen Worsened by:  Nothing tried Ineffective treatments:  None tried Associated symptoms: chills   Associated symptoms: no chest pain, no cough, no diarrhea, no dysuria, no headaches, no nausea, no rhinorrhea and no vomiting     Past Medical History  Diagnosis Date  . Hypertension   . Diabetes mellitus   . Sleep apnea     cpap sleep study - [facility]  . GERD (gastroesophageal reflux disease)   . Cancer     Prostate cancer  . Arthritis    Past Surgical History  Procedure Laterality Date  . Rotator cuff repair      left shoulder  . Prostate surgery      s/p cancer  . Microdiskectomy  11/01/2011  . Lumbar laminectomy/decompression microdiscectomy  11/01/2011    Procedure: LUMBAR LAMINECTOMY/DECOMPRESSION MICRODISCECTOMY;  Surgeon: Sinclair Ship, MD;  Location: [place];  Service: Orthopedics;  Laterality: Left;  Left sided lumbar 5- sacrum 1 microdisectomy   No family history on file. History  Substance Use Topics  . Smoking status: Former Smoker    Quit date: 05/04/1993  . Smokeless tobacco: Never Used  . Alcohol Use: Yes     Comment: ocassional    Review of Systems  Constitutional: Positive for fever, chills and fatigue.  HENT: Negative for drooling and rhinorrhea.   Eyes: Negative for pain.  Respiratory: Negative for cough and shortness of breath.   Cardiovascular: Negative for chest pain and leg swelling.  Gastrointestinal: Negative  for nausea, vomiting, abdominal pain and diarrhea.  Genitourinary: Negative for dysuria and hematuria.  Musculoskeletal: Negative for gait problem and neck pain.  Skin: Negative for color change.  Neurological: Negative for numbness and headaches.  Hematological: Negative for adenopathy.  Psychiatric/Behavioral: Negative for behavioral problems.  All other systems reviewed and are negative.     Allergies  Review of patient's allergies indicates no known allergies.  Home Medications   Prior to Admission medications   Medication Sig Start Date End Date Taking? Authorizing Provider  amLODipine (NORVASC) 10 MG tablet Take 10 mg by mouth daily.   Yes Historical Provider, MD  aspirin 81 MG chewable tablet Chew 81 mg by mouth daily.   Yes Historical Provider, MD  atorvastatin (LIPITOR) 80 MG tablet Take 80 mg by mouth daily.   Yes Historical Provider, MD  azelastine (ASTELIN) 137 MCG/SPRAY nasal spray Place 1 spray into both nostrils 2 (two) times daily as needed for rhinitis. Use in each nostril as directed   Yes Historical Provider, MD  azithromycin (ZITHROMAX Z-PAK) 250 MG tablet Take as directed. 12/01/13  Yes Harden Mo, MD  cefdinir (OMNICEF) 300 MG capsule Take 1 capsule (300 mg total) by mouth 2 (two) times daily. 12/01/13  Yes Harden Mo, MD  furosemide (LASIX) 20 MG tablet Take 20 mg by mouth daily as needed for edema.   Yes Historical Provider, MD  glucagon (GLUCAGEN) 1 MG SOLR Inject 1 mg into the vein once as needed (severely low  blood sugar).   Yes Historical Provider, MD  HYDROcodone-acetaminophen (NORCO/VICODIN) 5-325 MG per tablet 1 to 2 tabs every 4 to 6 hours as needed for pain. 12/01/13  Yes Harden Mo, MD  insulin glargine (LANTUS) 100 UNIT/ML injection Inject 22 Units into the skin at bedtime.   Yes Historical Provider, MD  lisinopril (PRINIVIL,ZESTRIL) 40 MG tablet Take 40 mg by mouth daily.   Yes Historical Provider, MD  metFORMIN (GLUCOPHAGE-XR) 500 MG 24 hr  tablet Take 500 mg by mouth daily with breakfast.   Yes Historical Provider, MD  naproxen (NAPROSYN) 500 MG tablet Take 500 mg by mouth 2 (two) times daily as needed.    Yes Historical Provider, MD  omeprazole (PRILOSEC) 20 MG capsule Take 20 mg by mouth daily.   Yes Historical Provider, MD  traMADol (ULTRAM) 50 MG tablet Take 50 mg by mouth every 6 (six) hours as needed for moderate pain.   Yes Historical Provider, MD  levofloxacin (LEVAQUIN) 750 MG tablet Take 1 tablet (750 mg total) by mouth daily. 12/03/13   Freeman Caldron Baker, PA-C   BP 134/77  Pulse 95  Temp(Src) 100.2 F (37.9 C) (Oral)  Resp 25  SpO2 94% Physical Exam  Nursing note and vitals reviewed. Constitutional: He is oriented to person, place, and time. He appears well-developed and well-nourished.  HENT:  Head: Normocephalic and atraumatic.  Right Ear: External ear normal.  Left Ear: External ear normal.  Nose: Nose normal.  Mouth/Throat: Oropharynx is clear and moist. No oropharyngeal exudate.  Eyes: Conjunctivae and EOM are normal. Pupils are equal, round, and reactive to light.  Neck: Normal range of motion. Neck supple.  Cardiovascular: Normal rate, regular rhythm, normal heart sounds and intact distal pulses.  Exam reveals no gallop and no friction rub.   No murmur heard. Pulmonary/Chest: Effort normal and breath sounds normal. No respiratory distress. He has no wheezes.  Mild crackles in the left lung base.  Abdominal: Soft. Bowel sounds are normal. He exhibits no distension. There is no tenderness. There is no rebound and no guarding.  Musculoskeletal: Normal range of motion. He exhibits no edema and no tenderness.  Neurological: He is alert and oriented to person, place, and time.  Skin: Skin is warm and dry.  Psychiatric: He has a normal mood and affect. His behavior is normal.    ED Course  Procedures (including critical care time) Labs Review Labs Reviewed  CBC WITH DIFFERENTIAL - Abnormal; Notable for the  following:    Neutrophils Relative % 78 (*)    Lymphocytes Relative 10 (*)    Monocytes Absolute 1.1 (*)    All other components within normal limits  COMPREHENSIVE METABOLIC PANEL  URINALYSIS, ROUTINE W REFLEX MICROSCOPIC    Imaging Review Dg Chest 2 View  12/03/2013   CLINICAL DATA:  Fever and body aches and malaise  EXAM: CHEST  2 VIEW  COMPARISON:  PA and lateral chest of December 01, 2013  FINDINGS: Again demonstrated is alveolar density in the superior segment of the left lower lobe. This has increased in conspicuity. The right lung is clear. There is no pleural effusion. The heart and pulmonary vascularity are normal. The bony thorax is unremarkable.  IMPRESSION: Progressive left lower lobe pneumonia.   Electronically Signed   By: David  Martinique   On: 12/03/2013 09:54   Dg Chest 2 View  12/01/2013   CLINICAL DATA:  Chills and body aches for 3 days. History of smoking.  EXAM: CHEST  2  VIEW  COMPARISON:  Chest radiograph from 07/07/2013  FINDINGS: The lungs are well-aerated. Rounded opacity at the left lower lobe is concerning for pneumonia. There is no evidence of pleural effusion or pneumothorax.  The heart is normal in size; the mediastinal contour is within normal limits. No acute osseous abnormalities are seen.  IMPRESSION: Rounded opacity at the left lower lobe, concerning for pneumonia. Would perform follow-up chest radiograph after completion of treatment for pneumonia, to ensure resolution of airspace opacity.   Electronically Signed   By: Garald Balding M.D.   On: 12/01/2013 21:26     EKG Interpretation   Date/Time:  Wednesday December 03 2013 12:22:19 EDT Ventricular Rate:  95 PR Interval:  117 QRS Duration: 77 QT Interval:  335 QTC Calculation: 421 R Axis:   -22 Text Interpretation:  Age not entered, assumed to be  68 years old for  purpose of ECG interpretation Sinus rhythm Borderline short PR interval  Borderline left axis deviation Abnormal R-wave progression, early   transition Nonspecific T abnormalities, lateral leads No significant  change since last tracing Confirmed by Filomena Pokorney  MD, [place] ([id number]) on  12/03/2013 1:07:21 PM      MDM   Final diagnoses:  CAP (community acquired pneumonia)    12:44 PM 68 y.o. male who presents with body aches, fever, chills, and fatigue. He was seen at urgent care 2 days ago and diagnosed with a left lower lobe pneumonia. He is been on Ceftin year and azithromycin since that time. He notes continued symptoms and fever. He denies any shortness of breath, cough, or chest pain. He was seen in urgent care today. They recommended switching to Levaquin with recheck the following day. He preferred to be evaluated in the ER. Will get screening labwork. He appears to be in no acute distress. He has a low-grade temperature here but vital signs otherwise unremarkable. No increased work of breathing.  I discussed the case with the internal medicine residents. He will be admitted to their service.  Pamella Pert, MD 12/03/13 [phone]

## 2013-12-03 NOTE — ED Notes (Signed)
Patient states went to [facility] on Monday and was diagnosed with PNA.   Patient states started feeling worse today with "serious body aches, chills".  Patient denies cough and denies any chest pain.  Patient still taking antibiotics he received Monday.

## 2013-12-03 NOTE — ED Provider Notes (Signed)
Medical screening examination/treatment/procedure(s) were performed by a resident physician or non-physician practitioner and as the supervising physician I was immediately available for consultation/collaboration.  Lynne Leader, MD   Gregor Hams, MD 12/03/13 [phone]

## 2013-12-03 NOTE — ED Notes (Signed)
Follow up on pneumonia States still taking antibiotics Does still have chills

## 2013-12-03 NOTE — ED Provider Notes (Signed)
CSN: [id number]     Arrival date & time 12/03/13  0813 History   First MD Initiated Contact with Patient 12/03/13 0840     Chief Complaint  Patient presents with  . Follow-up   (Consider location/radiation/quality/duration/timing/severity/associated sxs/prior Treatment) HPI Comments: 68 year old male presents for followup, he was diagnosed with community-acquired pneumonia 2 days ago. He continues to have subjective fever and chills and still feels terrible but overall he is feeling slightly better. He denies any cough or shortness of breath.   Past Medical History  Diagnosis Date  . Hypertension   . Diabetes mellitus   . Sleep apnea     cpap sleep study - [facility]  . GERD (gastroesophageal reflux disease)   . Cancer     Prostate cancer  . Arthritis    Past Surgical History  Procedure Laterality Date  . Rotator cuff repair      left shoulder  . Prostate surgery      s/p cancer  . Microdiskectomy  11/01/2011  . Lumbar laminectomy/decompression microdiscectomy  11/01/2011    Procedure: LUMBAR LAMINECTOMY/DECOMPRESSION MICRODISCECTOMY;  Surgeon: Sinclair Ship, MD;  Location: [place];  Service: Orthopedics;  Laterality: Left;  Left sided lumbar 5- sacrum 1 microdisectomy   History reviewed. No pertinent family history. History  Substance Use Topics  . Smoking status: Former Smoker    Quit date: 05/04/1993  . Smokeless tobacco: Never Used  . Alcohol Use: Yes     Comment: ocassional    Review of Systems  Constitutional: Positive for fever and chills.  Musculoskeletal: Positive for arthralgias and myalgias.  All other systems reviewed and are negative.   Allergies  Review of patient's allergies indicates no active allergies.  Home Medications   Prior to Admission medications   Medication Sig Start Date End Date Taking? Authorizing Provider  amLODipine (NORVASC) 10 MG tablet Take 10 mg by mouth daily.    Historical Provider, MD  aspirin 81 MG chewable tablet Chew 81 mg  by mouth daily.    Historical Provider, MD  atorvastatin (LIPITOR) 80 MG tablet Take 80 mg by mouth daily.    Historical Provider, MD  azelastine (ASTELIN) 137 MCG/SPRAY nasal spray Place 1 spray into both nostrils 2 (two) times daily. Use in each nostril as directed    Historical Provider, MD  azithromycin (ZITHROMAX Z-PAK) 250 MG tablet Take as directed. 12/01/13   Harden Mo, MD  cefdinir (OMNICEF) 300 MG capsule Take 1 capsule (300 mg total) by mouth 2 (two) times daily. 12/01/13   Harden Mo, MD  clotrimazole-betamethasone (LOTRISONE) cream Apply 1 application topically 2 (two) times daily.    Historical Provider, MD  furosemide (LASIX) 20 MG tablet Take 20 mg by mouth daily as needed for edema.    Historical Provider, MD  glucagon (GLUCAGEN) 1 MG SOLR Inject 1 mg into the vein once as needed (severely low blood sugar).    Historical Provider, MD  HYDROcodone-acetaminophen (NORCO/VICODIN) 5-325 MG per tablet 1 to 2 tabs every 4 to 6 hours as needed for pain. 12/01/13   Harden Mo, MD  insulin glargine (LANTUS) 100 UNIT/ML injection Inject 22 Units into the skin at bedtime.    Historical Provider, MD  levofloxacin (LEVAQUIN) 750 MG tablet Take 1 tablet (750 mg total) by mouth daily. 12/03/13   Freeman Caldron Aison Mal[place], PA-C  lisinopril (PRINIVIL,ZESTRIL) 40 MG tablet Take 40 mg by mouth daily.    Historical Provider, MD  metFORMIN (GLUCOPHAGE-XR) 500 MG 24 hr tablet  Take 500 mg by mouth daily with breakfast.    Historical Provider, MD  naproxen (NAPROSYN) 500 MG tablet Take 500 mg by mouth 2 (two) times daily with a meal.    Historical Provider, MD  omeprazole (PRILOSEC) 20 MG capsule Take 20 mg by mouth daily.    Historical Provider, MD  ondansetron (ZOFRAN ODT) 4 MG disintegrating tablet 4mg  ODT q4 hours prn nausea/vomit 07/07/13   Julianne Rice, MD  traMADol (ULTRAM) 50 MG tablet Take 50 mg by mouth every 6 (six) hours as needed for moderate pain.    Historical Provider, MD   BP 142/85   Pulse 104  Temp(Src) 100.5 F (38.1 C) (Oral)  Resp 16  SpO2 96% Physical Exam  Nursing note and vitals reviewed. Constitutional: He is oriented to person, place, and time. He appears well-developed and well-nourished. No distress.  HENT:  Head: Normocephalic.  Cardiovascular: Regular rhythm, normal heart sounds and normal pulses.  Tachycardia present.   Pulmonary/Chest: Effort normal. No respiratory distress. He has no decreased breath sounds. He has no wheezes. He has no rhonchi. He has rales in the left middle field and the left lower field.  Neurological: He is alert and oriented to person, place, and time. Coordination normal.  Skin: Skin is warm and dry. No rash noted. He is not diaphoretic.  Psychiatric: He has a normal mood and affect. Judgment normal.    ED Course  Procedures (including critical care time) Labs Review Labs Reviewed  CBC WITH DIFFERENTIAL - Abnormal; Notable for the following:    Neutrophils Relative % 79 (*)    Lymphocytes Relative 9 (*)    Monocytes Absolute 1.1 (*)    All other components within normal limits  POCT I-STAT, CHEM 8 - Abnormal; Notable for the following:    Sodium 134 (*)    Glucose, Bld 179 (*)    Calcium, Ion 1.02 (*)    All other components within normal limits    Imaging Review Dg Chest 2 View  12/03/2013   CLINICAL DATA:  Fever and body aches and malaise  EXAM: CHEST  2 VIEW  COMPARISON:  PA and lateral chest of December 01, 2013  FINDINGS: Again demonstrated is alveolar density in the superior segment of the left lower lobe. This has increased in conspicuity. The right lung is clear. There is no pleural effusion. The heart and pulmonary vascularity are normal. The bony thorax is unremarkable.  IMPRESSION: Progressive left lower lobe pneumonia.   Electronically Signed   By: David  Martinique   On: 12/03/2013 09:54   Dg Chest 2 View  12/01/2013   CLINICAL DATA:  Chills and body aches for 3 days. History of smoking.  EXAM: CHEST  2 VIEW   COMPARISON:  Chest radiograph from 07/07/2013  FINDINGS: The lungs are well-aerated. Rounded opacity at the left lower lobe is concerning for pneumonia. There is no evidence of pleural effusion or pneumothorax.  The heart is normal in size; the mediastinal contour is within normal limits. No acute osseous abnormalities are seen.  IMPRESSION: Rounded opacity at the left lower lobe, concerning for pneumonia. Would perform follow-up chest radiograph after completion of treatment for pneumonia, to ensure resolution of airspace opacity.   Electronically Signed   By: Garald Balding M.D.   On: 12/01/2013 21:26   Still febrile and tachycardic.  With recheck CXR and CBC, I-stat   MDM   1. CAP (community acquired pneumonia)    There is slight interval worsening in the  chest x-ray. I discussed this with the patient, recommended switching to Levaquin and recheck tomorrow, he has decided that he would like to go to the emergency department for consideration for IV antibiotics and for consideration for admission. Transferred via [place], PA-C 12/03/13 1023

## 2013-12-03 NOTE — ED Notes (Signed)
Attempted report X1

## 2013-12-03 NOTE — ED Notes (Signed)
Pt is aware of needing an urine specimen

## 2013-12-03 NOTE — H&P (Signed)
Date: 12/03/2013               Patient Name:  Dean Brown MRN: [id number]  DOB: 1945-05-15 Age / Sex: 68 y.o., male   PCP: Marijean Heath, NP         Medical Service: Internal Medicine Teaching Service         Attending Physician: Dr. Carlyle Basques, MD    First Contact: Dr. Charlott Rakes Pager: [phone]  Second Contact: Dr. Adele Barthel Pager: [phone]       After Hours (After 5p/  First Contact Pager: [phone]  weekends / holidays): Second Contact Pager: [phone]   Chief Complaint: fever, chills, myalgia x 5 days  History of Present Illness: Mr. Labreck is a 68 year old male with DM2, HLD, HTN, OSA, h/o prostate cancer who presents with fever, chills, myalgias x 5 days.  Two days ago, he presented to Urgent Care with the same symptoms, T100.662F. CXR was remarkable for LLL infiltrate. He was given ceftriaxone 1g IM and sent home on azithromycin and cefdinir. His symptoms didn't improve, so he followed up at Urgent Care again before being referred to the ED for meeting SIRS criteria (T100.662F, HR 104). CXR now showed a progressing LLL pneumonia  He notes dizziness but denies shortness of breath, cough, sneezing. 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  In the ED, he received ceftriaxone 1g IV and azithromycin 500mg  IV.   Meds: Current Facility-Administered Medications  Medication Dose Route Frequency Provider Last Rate Last Dose  . azithromycin (ZITHROMAX) 500 mg in dextrose 5 % 250 mL IVPB  500 mg Intravenous Once Pamella Pert, MD       Current Outpatient Prescriptions  Medication Sig Dispense Refill  . amLODipine (NORVASC) 10 MG tablet Take 10 mg by mouth daily.      Marland Kitchen aspirin 81 MG chewable tablet Chew 81 mg by mouth daily.      Marland Kitchen atorvastatin (LIPITOR) 80 MG tablet Take 80 mg by mouth daily.      Marland Kitchen azelastine (ASTELIN) 137 MCG/SPRAY nasal spray Place 1 spray into both nostrils 2 (two) times daily  as needed for rhinitis. Use in each nostril as directed      . azithromycin (ZITHROMAX Z-PAK) 250 MG tablet Take as directed.  6 tablet  0  . cefdinir (OMNICEF) 300 MG capsule Take 1 capsule (300 mg total) by mouth 2 (two) times daily.  30 capsule  0  . furosemide (LASIX) 20 MG tablet Take 20 mg by mouth daily as needed for edema.      Marland Kitchen glucagon (GLUCAGEN) 1 MG SOLR Inject 1 mg into the vein once as needed (severely low blood sugar).      Marland Kitchen HYDROcodone-acetaminophen (NORCO/VICODIN) 5-325 MG per tablet 1 to 2 tabs every 4 to 6 hours as needed for pain.  20 tablet  0  . insulin glargine (LANTUS) 100 UNIT/ML injection Inject 22 Units into the skin at bedtime.      Marland Kitchen lisinopril (PRINIVIL,ZESTRIL) 40 MG tablet Take 40 mg by mouth daily.      . metFORMIN (GLUCOPHAGE-XR) 500 MG 24 hr tablet Take 500 mg by mouth daily with breakfast.      . naproxen (NAPROSYN) 500 MG tablet Take 500 mg by mouth 2 (two) times daily as needed.       Marland Kitchen omeprazole (PRILOSEC) 20 MG capsule Take 20 mg by mouth daily.      Marland Kitchen  traMADol (ULTRAM) 50 MG tablet Take 50 mg by mouth every 6 (six) hours as needed for moderate pain.      Marland Kitchen levofloxacin (LEVAQUIN) 750 MG tablet Take 1 tablet (750 mg total) by mouth daily.  5 tablet  0    Allergies: Allergies as of 12/03/2013  . (No Known Allergies)   Past Medical History  Diagnosis Date  . Hypertension   . Diabetes mellitus   . Sleep apnea     cpap sleep study - [facility]  . GERD (gastroesophageal reflux disease)   . Cancer     Prostate cancer  . Arthritis    Past Surgical History  Procedure Laterality Date  . Rotator cuff repair      left shoulder  . Prostate surgery      s/p cancer  . Microdiskectomy  11/01/2011  . Lumbar laminectomy/decompression microdiscectomy  11/01/2011    Procedure: LUMBAR LAMINECTOMY/DECOMPRESSION MICRODISCECTOMY;  Surgeon: Sinclair Ship, MD;  Location: [place];  Service: Orthopedics;  Laterality: Left;  Left sided lumbar 5- sacrum 1  microdisectomy   No family history on file. History   Social History  . Marital Status: Married    Spouse Name: N/A    Number of Children: N/A  . Years of Education: N/A   Occupational History  . Not on file.   Social History Main Topics  . Smoking status: Former Smoker    Quit date: 05/04/1993  . Smokeless tobacco: Never Used  . Alcohol Use: Yes     Comment: ocassional  . Drug Use: No  . Sexual Activity: Yes   Other Topics Concern  . Not on file   Social History Narrative  . No narrative on file    Review of Systems: Review of Systems  Constitutional: Positive for fever, chills and malaise/fatigue.  Eyes: Negative.   Respiratory: Negative for cough, sputum production and shortness of breath.   Cardiovascular: Negative for chest pain.  Gastrointestinal: Negative.  Negative for nausea, vomiting, abdominal pain and diarrhea.  Genitourinary: Negative.   Musculoskeletal: Negative.   Neurological: Positive for dizziness.  Endo/Heme/Allergies: Negative.   Psychiatric/Behavioral: Negative.     Physical Exam: Blood pressure 133/91, pulse 85, temperature 99.2 F (37.3 C), temperature source Oral, resp. rate 18, SpO2 95.00%.  General: resting in bed, NAD HEENT: PERRL, EOMI, no scleral icterus, dry mucous membranes Cardiac: RRR, no rubs, murmurs or gallops Pulm: clear to auscultation bilaterally, crackles present on left middle lobe Abd: soft, nontender, nondistended, BS present Ext: warm and well perfused, no pedal edema Neuro: alert and oriented X3, cranial nerves II-XII grossly intact, strength and sensation to light touch equal in bilateral upper and lower extremities   Lab results: Basic Metabolic Panel:  Recent Labs  12/03/13 0917 12/03/13 1203  NA 134* 136*  K 4.0 4.0  CL 103 97  CO2  --  25  GLUCOSE 179* 142*  BUN 9 10  CREATININE 1.10 0.93  CALCIUM  --  8.6   Liver Function Tests:  Recent Labs  12/03/13 1203  AST 31  ALT 36  ALKPHOS 71    BILITOT 0.6  PROT 7.2  ALBUMIN 3.1*   CBC:  Recent Labs  12/03/13 0859 12/03/13 0917 12/03/13 1203  WBC 9.3  --  8.8  NEUTROABS 7.4  --  6.9  HGB 13.7 15.0 13.9  HCT 41.8 44.0 41.8  MCV 93.1  --  92.1  PLT 197  --  183    Urinalysis:  Recent Labs  12/01/13 2145  LABSPEC 1.015  PHURINE 6.0  GLUCOSEU 100*  HGBUR TRACE*  BILIRUBINUR SMALL*  KETONESUR TRACE*  PROTEINUR 30*  UROBILINOGEN 0.2  NITRITE NEGATIVE  LEUKOCYTESUR NEGATIVE   Imaging results:  Dg Chest 2 View  12/03/2013   CLINICAL DATA:  Fever and body aches and malaise  EXAM: CHEST  2 VIEW  COMPARISON:  PA and lateral chest of December 01, 2013  FINDINGS: Again demonstrated is alveolar density in the superior segment of the left lower lobe. This has increased in conspicuity. The right lung is clear. There is no pleural effusion. The heart and pulmonary vascularity are normal. The bony thorax is unremarkable.  IMPRESSION: Progressive left lower lobe pneumonia.   Electronically Signed   By: David  Martinique   On: 12/03/2013 09:54   Dg Chest 2 View  12/01/2013   CLINICAL DATA:  Chills and body aches for 3 days. History of smoking.  EXAM: CHEST  2 VIEW  COMPARISON:  Chest radiograph from 07/07/2013  FINDINGS: The lungs are well-aerated. Rounded opacity at the left lower lobe is concerning for pneumonia. There is no evidence of pleural effusion or pneumothorax.  The heart is normal in size; the mediastinal contour is within normal limits. No acute osseous abnormalities are seen.  IMPRESSION: Rounded opacity at the left lower lobe, concerning for pneumonia. Would perform follow-up chest radiograph after completion of treatment for pneumonia, to ensure resolution of airspace opacity.   Electronically Signed   By: Garald Balding M.D.   On: 12/01/2013 21:26    Other results: EKG: Reviewed and compared with 07/07/13 Tachycardic Normal sinus RBBB, stable from prior year  Assessment & Plan by Problem: Principal Problem:   SIRS  (systemic inflammatory response syndrome) Active Problems:   CAP (community acquired pneumonia)   HLD (hyperlipidemia)   BP (high blood pressure)   Obstructive apnea   Diabetes mellitus, type 2  #SIRS 2/2 CAP: He meets criteria by HR & temperature and has received antibiotics for 2 days without improvement in clinical signs. Source is likely CAP given evolution of CXR findings and UA reassuring. He does appear a bit dry and is dizzy but is tolerating PO intake. -Hold off on IVF -Continue azithromycin & ceftriaxone -Check blood cultures x 2 -Check Strep & Legionella Ag in urine -Check HIV -Check UA & urine culture -Give Tylenol prn for fever -Continue pulse oximetry  -Monitor on telemetry  #HTN: Continue amlodipine 10mg , lisinopril 40mg   #DM2: Continue Lantus 14 units, sensitive SSI. -Holding metformin 500mg  BID  #HLD: Continue home atorvastatin 80mg   #OSA: Continue CPAP  #FEN:  -Heart Healthy/Carb Modified  #DVT prophylaxis: heparin 5000 units subcutaneous   Dispo: Disposition is deferred at this time, awaiting improvement of current medical problems. Anticipated discharge in approximately 1-2 day(s).   The patient does have a current PCP Marijean Heath, NP) and does need an [facility] hospital follow-up appointment after discharge.  The patient does not know have transportation limitations that hinder transportation to clinic appointments.  Signed: Charlott Rakes, MD 12/03/2013, 3:33 PM

## 2013-12-03 NOTE — Progress Notes (Signed)
During shift assessment Patient stated he had no skin issues on his buttocks. Unable to visualize at this time. Will continue to monitor.

## 2013-12-03 NOTE — Progress Notes (Signed)
Admission note:  Arrival Method: stretcher from ED Mental Orientation:A&O X4 Telemetry: N/A Assessment: see doc flowsheet Skin: dry,intact IV: R hand Pain: Denies pain Tubes: N/A Safety Measures: Fall safety video viewed by patient. Bed alarm Fall Prevention Safety Plan: Fall rtisk interventions in place Admission Screening: To be completed 6700 Orientation: Patient has been oriented to the unit, staff and to the room.

## 2013-12-03 NOTE — Discharge Instructions (Signed)

## 2013-12-03 NOTE — Progress Notes (Signed)
Patient's IV came out while patient was trying to sit up. Attempted to restart IV. Patient request that IV be restarted after he eats dinner. Will continue to monitor.

## 2013-12-04 DIAGNOSIS — J159 Unspecified bacterial pneumonia: Secondary | ICD-10-CM | POA: Diagnosis not present

## 2013-12-04 DIAGNOSIS — A419 Sepsis, unspecified organism: Secondary | ICD-10-CM

## 2013-12-04 LAB — BASIC METABOLIC PANEL
ANION GAP: 14 (ref 5–15)
BUN: 12 mg/dL (ref 6–23)
CO2: 24 meq/L (ref 19–32)
Calcium: 8.8 mg/dL (ref 8.4–10.5)
Chloride: 103 mEq/L (ref 96–112)
Creatinine, Ser: 0.96 mg/dL (ref 0.50–1.35)
GFR calc Af Amer: >90 mL/min (ref >90)
GFR calc non Af Amer: 84 mL/min — ABNORMAL LOW (ref >90)
GLUCOSE: 117 mg/dL — AB (ref 70–99)
POTASSIUM: 4 meq/L (ref 3.7–5.3)
SODIUM: 141 meq/L (ref 137–147)

## 2013-12-04 LAB — LEGIONELLA ANTIGEN, URINE: Legionella Antigen, Urine: NEGATIVE

## 2013-12-04 LAB — GLUCOSE, CAPILLARY
GLUCOSE-CAPILLARY: 107 mg/dL — AB (ref 70–99)
Glucose-Capillary: 144 mg/dL — ABNORMAL HIGH (ref 70–99)

## 2013-12-04 LAB — CBC WITH DIFFERENTIAL/PLATELET
BASOS ABS: 0 10*3/uL (ref 0.0–0.1)
Basophils Relative: 0 % (ref 0–1)
Eosinophils Absolute: 0 10*3/uL (ref 0.0–0.7)
Eosinophils Relative: 1 % (ref 0–5)
HEMATOCRIT: 39.6 % (ref 39.0–52.0)
Hemoglobin: 13.5 g/dL (ref 13.0–17.0)
LYMPHS ABS: 1 10*3/uL (ref 0.7–4.0)
LYMPHS PCT: 20 % (ref 12–46)
MCH: 30.3 pg (ref 26.0–34.0)
MCHC: 34.1 g/dL (ref 30.0–36.0)
MCV: 89 fL (ref 78.0–100.0)
MONO ABS: 1 10*3/uL (ref 0.1–1.0)
Monocytes Relative: 19 % — ABNORMAL HIGH (ref 3–12)
Neutro Abs: 3.2 10*3/uL (ref 1.7–7.7)
Neutrophils Relative %: 60 % (ref 43–77)
PLATELETS: 207 10*3/uL (ref 150–400)
RBC: 4.45 MIL/uL (ref 4.22–5.81)
RDW: 13.8 % (ref 11.5–15.5)
WBC: 5.3 10*3/uL (ref 4.0–10.5)

## 2013-12-04 LAB — VITAMIN B12: Vitamin B-12: 756 pg/mL (ref 211–911)

## 2013-12-04 LAB — HIV ANTIBODY (ROUTINE TESTING W REFLEX): HIV 1&2 Ab, 4th Generation: NONREACTIVE

## 2013-12-04 MED ORDER — AZITHROMYCIN 500 MG PO TABS
500.0000 mg | ORAL_TABLET | Freq: Once | ORAL | Status: AC
  Administered 2013-12-04: 500 mg via ORAL
  Filled 2013-12-04: qty 1

## 2013-12-04 MED ORDER — CEFDINIR 125 MG/5ML PO SUSR
300.0000 mg | Freq: Once | ORAL | Status: AC
  Administered 2013-12-04: 300 mg via ORAL
  Filled 2013-12-04: qty 15

## 2013-12-04 NOTE — Discharge Summary (Signed)
Name: Dean Brown MRN: [id number] DOB: 09-May-1945 68 y.o. PCP: Dean Heath, NP  Date of Admission: 12/03/2013 11:09 AM Date of Discharge: 12/04/2013 Attending Physician: Carlyle Basques, MD  Discharge Diagnosis: Principal Problem:   SIRS (systemic inflammatory response syndrome) Active Problems:   CAP (community acquired pneumonia)   HLD (hyperlipidemia)   BP (high blood pressure)   Obstructive apnea   Diabetes mellitus, type 2  Discharge Medications:   Medication List         amLODipine 10 MG tablet  Commonly known as:  NORVASC  Take 10 mg by mouth daily.     aspirin 81 MG chewable tablet  Chew 81 mg by mouth daily.     atorvastatin 80 MG tablet  Commonly known as:  LIPITOR  Take 80 mg by mouth daily.     azelastine 0.1 % nasal spray  Commonly known as:  ASTELIN  Place 1 spray into both nostrils 2 (two) times daily as needed for rhinitis. Use in each nostril as directed     azithromycin 250 MG tablet  Commonly known as:  ZITHROMAX Z-PAK  Take as directed.     cefdinir 300 MG capsule  Commonly known as:  OMNICEF  Take 1 capsule (300 mg total) by mouth 2 (two) times daily.     furosemide 20 MG tablet  Commonly known as:  LASIX  Take 20 mg by mouth daily as needed for edema.     GLUCAGEN 1 MG Solr injection  Generic drug:  glucagon  Inject 1 mg into the vein once as needed (severely low blood sugar).     HYDROcodone-acetaminophen 5-325 MG per tablet  Commonly known as:  NORCO/VICODIN  1 to 2 tabs every 4 to 6 hours as needed for pain.     insulin glargine 100 UNIT/ML injection  Commonly known as:  LANTUS  Inject 22 Units into the skin at bedtime.     levofloxacin 750 MG tablet  Commonly known as:  LEVAQUIN  Take 1 tablet (750 mg total) by mouth daily.     lisinopril 40 MG tablet  Commonly known as:  PRINIVIL,ZESTRIL  Take 40 mg by mouth daily.     metFORMIN 500 MG 24 hr tablet  Commonly known as:  GLUCOPHAGE-XR  Take 500 mg by mouth  daily with breakfast.     naproxen 500 MG tablet  Commonly known as:  NAPROSYN  Take 500 mg by mouth 2 (two) times daily as needed.     omeprazole 20 MG capsule  Commonly known as:  PRILOSEC  Take 20 mg by mouth daily.     traMADol 50 MG tablet  Commonly known as:  ULTRAM  Take 50 mg by mouth every 6 (six) hours as needed for moderate pain.        Disposition and follow-up:   Mr.Dean Brown was discharged from [facility] in Stable condition.  At the hospital follow up visit please address:  1.  Pneumonia: resolution of symptoms  2.  Establish PCP care: interested in Prevnar vaccine  2.  Labs / imaging needed at time of follow-up: CXR (7-12 weeks from treatment to assess clearance of infiltrate)  3.  Pending labs/ test needing follow-up: none  Follow-up Appointments: Follow-up Information   Follow up with Dean Felling, MD In 1 week. (12/11/13 @ 9AM)    Specialty:  Internal Medicine   Contact information:   [place] [address] [phone]  Discharge Instructions: Discharge Instructions   Call MD for:  difficulty breathing, headache or visual disturbances    Complete by:  As directed      Call MD for:  persistant dizziness or light-headedness    Complete by:  As directed      Call MD for:  persistant nausea and vomiting    Complete by:  As directed      Call MD for:  temperature >100.4    Complete by:  As directed      Diet - low sodium heart healthy    Complete by:  As directed      Increase activity slowly    Complete by:  As directed            Consultations:    Procedures Performed:  Dg Chest 2 View  12/03/2013   CLINICAL DATA:  Fever and body aches and malaise  EXAM: CHEST  2 VIEW  COMPARISON:  PA and lateral chest of December 01, 2013  FINDINGS: Again demonstrated is alveolar density in the superior segment of the left lower lobe. This has increased in conspicuity. The right lung is clear. There is no pleural  effusion. The heart and pulmonary vascularity are normal. The bony thorax is unremarkable.  IMPRESSION: Progressive left lower lobe pneumonia.   Electronically Signed   By: David  Martinique   On: 12/03/2013 09:54   Dg Chest 2 View  12/01/2013   CLINICAL DATA:  Chills and body aches for 3 days. History of smoking.  EXAM: CHEST  2 VIEW  COMPARISON:  Chest radiograph from 07/07/2013  FINDINGS: The lungs are well-aerated. Rounded opacity at the left lower lobe is concerning for pneumonia. There is no evidence of pleural effusion or pneumothorax.  The heart is normal in size; the mediastinal contour is within normal limits. No acute osseous abnormalities are seen.  IMPRESSION: Rounded opacity at the left lower lobe, concerning for pneumonia. Would perform follow-up chest radiograph after completion of treatment for pneumonia, to ensure resolution of airspace opacity.   Electronically Signed   By: Garald Balding M.D.   On: 12/01/2013 21:26    Admission HPI: Mr. Dean Brown is a 68 year old male with DM2, HLD, HTN, OSA, h/o prostate cancer who presents with fever, chills, myalgias x 5 days.   Two days ago, he presented to Urgent Care with the same symptoms, T100.54F. CXR was remarkable for LLL infiltrate. He was given ceftriaxone 1g IM and sent home on azithromycin and cefdinir. His symptoms didn't improve, so he followed up at Urgent Care again before being referred to the ED for meeting SIRS criteria (T100.54F, HR 104). CXR now showed a progressing LLL pneumonia   He notes dizziness but denies shortness of breath, cough, sneezing. 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   In the ED, he received ceftriaxone 1g IV and azithromycin 512m IV.    Hospital Course by problem list: Principal Problem:   SIRS (systemic inflammatory response syndrome) Active Problems:   CAP (community acquired pneumonia)   HLD (hyperlipidemia)   BP (high blood pressure)    Obstructive apnea   Diabetes mellitus, type 2   1. SIRS: On admission, he met 2/4 SIRS criteria (HR 104 & T100.54F). CXR showed an evolving LLL infilitrate compared to CXR from 2 days ago. He was continued on his antibiotic regimen and received azithromycin 1g IV & ceftriaxone 5091mIV. Overnight, his fever abated and felt  much better symptomatically the following morning, Urine Strep & Legionella Ag was negative. He maintained >92% saturation on room air. He received cefdinir 349m (1252m5mL) and azithromycin 5003mrally prior discharge and was asked to continue taking his antibiotics through the 5th day (8/15).   #HTN: He continued taking his home meds: amlodipine 49m55misinopril 40mg12m trended 110-130/70-90 during hospitalization and remained normotensive at the time of discharge.  #DM2: He was continued on Lantus 14 units and sensitive SSI while holding metformin 500mg 46m CBGs ranged 100-200 during his hospital stay.  #HLD: He was continued on atorvastatin 80mg. 15mA: Overnight, received CPAP and O2 saturations remained >92%.    Discharge Vitals:   BP 108/87  Pulse 75  Temp(Src) 98.5 F (36.9 C) (Oral)  Resp 18  Wt 176 lb 9.4 oz (80.1 kg)  SpO2 98%  Discharge Labs:  Results for orders placed during the hospital encounter of 12/03/13 (from the past 24 hour(s))  CULTURE, BLOOD (ROUTINE X 2)     Status: None   Collection Time    12/03/13  3:40 PM      Result Value Ref Range   Specimen Description BLOOD LEFT ANTECUBITAL     Special Requests BOTTLES DRAWN AEROBIC AND ANAEROBIC 10CC     Culture  Setup Time       Value: 12/03/2013 20:06     Performed at [facility]       Value:        BLOOD CULTURE RECEIVED NO GROWTH TO DATE CULTURE WILL BE HELD FOR 5 DAYS BEFORE ISSUING A FINAL NEGATIVE REPORT     Performed at [facility] Status PENDING    CULTURE, BLOOD (ROUTINE X 2)     Status: None   Collection Time    12/03/13  3:55 PM       Result Value Ref Range   Specimen Description BLOOD LEFT HAND     Special Requests BOTTLES DRAWN AEROBIC ONLY 10CC     Culture  Setup Time       Value: 12/03/2013 20:07     Performed at [facility]       Value:        BLOOD CULTURE RECEIVED NO GROWTH TO DATE CULTURE WILL BE HELD FOR 5 DAYS BEFORE ISSUING A FINAL NEGATIVE REPORT     Performed at [facility] Status PENDING    GLUCOSE, CAPILLARY     Status: Abnormal   Collection Time    12/03/13  4:08 PM      Result Value Ref Range   Glucose-Capillary 122 (*) 70 - 99 mg/dL  LEGIONELLA ANTIGEN, URINE     Status: None   Collection Time    12/03/13  5:31 PM      Result Value Ref Range   Specimen Description URINE, RANDOM     Special Requests NONE     Legionella Antigen, Urine       Value: Negative for Legionella pneumophilia serogroup 1     Performed at [facility] Status 12/04/2013 FINAL    STREP PNEUMONIAE URINARY ANTIGEN     Status: None   Collection Time    12/03/13  5:31 PM      Result Value Ref Range   Strep Pneumo Urinary Antigen NEGATIVE  NEGATIVE  HIV ANTIBODY (ROUTINE TESTING)     Status: None   Collection Time  12/03/13  6:03 PM      Result Value Ref Range   HIV 1&2 Ab, 4th Generation NONREACTIVE  NONREACTIVE  GLUCOSE, CAPILLARY     Status: Abnormal   Collection Time    12/03/13 10:03 PM      Result Value Ref Range   Glucose-Capillary 141 (*) 70 - 99 mg/dL  BASIC METABOLIC PANEL     Status: Abnormal   Collection Time    12/04/13  6:10 AM      Result Value Ref Range   Sodium 141  137 - 147 mEq/L   Potassium 4.0  3.7 - 5.3 mEq/L   Chloride 103  96 - 112 mEq/L   CO2 24  19 - 32 mEq/L   Glucose, Bld 117 (*) 70 - 99 mg/dL   BUN 12  6 - 23 mg/dL   Creatinine, Ser 0.96  0.50 - 1.35 mg/dL   Calcium 8.8  8.4 - 10.5 mg/dL   GFR calc non Af Amer 84 (*) >90 mL/min   GFR calc Af Amer >90  >90 mL/min   Anion gap 14  5 - 15  CBC WITH DIFFERENTIAL     Status:  Abnormal   Collection Time    12/04/13  6:10 AM      Result Value Ref Range   WBC 5.3  4.0 - 10.5 K/uL   RBC 4.45  4.22 - 5.81 MIL/uL   Hemoglobin 13.5  13.0 - 17.0 g/dL   HCT 39.6  39.0 - 52.0 %   MCV 89.0  78.0 - 100.0 fL   MCH 30.3  26.0 - 34.0 pg   MCHC 34.1  30.0 - 36.0 g/dL   RDW 13.8  11.5 - 15.5 %   Platelets 207  150 - 400 K/uL   Neutrophils Relative % 60  43 - 77 %   Neutro Abs 3.2  1.7 - 7.7 K/uL   Lymphocytes Relative 20  12 - 46 %   Lymphs Abs 1.0  0.7 - 4.0 K/uL   Monocytes Relative 19 (*) 3 - 12 %   Monocytes Absolute 1.0  0.1 - 1.0 K/uL   Eosinophils Relative 1  0 - 5 %   Eosinophils Absolute 0.0  0.0 - 0.7 K/uL   Basophils Relative 0  0 - 1 %   Basophils Absolute 0.0  0.0 - 0.1 K/uL  VITAMIN B12     Status: None   Collection Time    12/04/13  6:10 AM      Result Value Ref Range   Vitamin B-12 756  211 - 911 pg/mL  GLUCOSE, CAPILLARY     Status: Abnormal   Collection Time    12/04/13  7:42 AM      Result Value Ref Range   Glucose-Capillary 107 (*) 70 - 99 mg/dL  GLUCOSE, CAPILLARY     Status: Abnormal   Collection Time    12/04/13 11:23 AM      Result Value Ref Range   Glucose-Capillary 144 (*) 70 - 99 mg/dL    Signed: Charlott Rakes, MD 12/04/2013, 2:44 PM    Services Ordered on Discharge: None Equipment Ordered on Discharge: None

## 2013-12-04 NOTE — Discharge Instructions (Signed)
Thank you for trusting [place] with your medical care!  You were hospitalized for pneumonia and were treated with antibiotics.   Please take azithromycin 1 tablet (500mg ) Friday & Saturday.  Please take cefdinir 1 tablet (300mg ) tonight and twice daily Friday & Saturday.   Please follow-up with Dr. Arcelia Jew on 8/20 @ 9AM here in our Internal Medicine Clinic.    Pneumonia, Adult Pneumonia is an infection of the lungs. It may be caused by a germ (virus or bacteria). Some types of pneumonia can spread easily from person to person. This can happen when you cough or sneeze. HOME CARE  Only take medicine as told by your doctor.  Take your medicine (antibiotics) as told. Finish it even if you start to feel better.  Do not smoke.  You may use a vaporizer or humidifier in your room. This can help loosen thick spit (mucus).  Sleep so you are almost sitting up (semi-upright). This helps reduce coughing.  Rest. A shot (vaccine) can help prevent pneumonia. Shots are often advised for:  People over 26 years old.  Patients on chemotherapy.  People with long-term (chronic) lung problems.  People with immune system problems. GET HELP RIGHT AWAY IF:   You are getting worse.  You cannot control your cough, and you are losing sleep.  You cough up blood.  Your pain gets worse, even with medicine.  You have a fever.  Any of your problems are getting worse, not better.  You have shortness of breath or chest pain. MAKE SURE YOU:   Understand these instructions.  Will watch your condition.  Will get help right away if you are not doing well or get worse. Document Released: 09/27/2007 Document Revised: 07/03/2011 Document Reviewed: 07/01/2010 [facility] Patient Information 2015 [place], [place]. This information is not intended to replace advice given to you by your health care provider. Make sure you discuss any questions you have with your health care provider.

## 2013-12-04 NOTE — Progress Notes (Signed)
Patient to be discharged to home. Discharge instructions and follow up appts reviewed with patient and patient's spouse. No PIV to remove, no telemetry. Patient already had antibiotics at home that were prescribed on Monday, per Dr. Posey Pronto. Antibiotic administration instructions were reviewed.   Joellen [place], RN.

## 2013-12-04 NOTE — Plan of Care (Signed)
Problem: Phase II Progression Outcomes Goal: Wean O2 if indicated Outcome: Completed/Met Date Met:  12/04/13 Patient on RA.

## 2013-12-04 NOTE — Progress Notes (Signed)
Subjective: No acute events overnight. He denies fevers subjectively and feels better this AM. He also denies any dyspnea or productive cough. O2 sat in mid 90s.   Objective: Vital signs in last 24 hours: Filed Vitals:   12/03/13 2204 12/04/13 0015 12/04/13 0623 12/04/13 1000  BP: 116/67  116/65 108/87  Pulse: 88  74 75  Temp: 100.2 F (37.9 C)  98.3 F (36.8 C) 98.5 F (36.9 C)  TempSrc: Oral  Oral Oral  Resp: 18 18 18 18   Weight: 176 lb 9.4 oz (80.1 kg)     SpO2: 96%  97% 98%   Weight change:   Intake/Output Summary (Last 24 hours) at 12/04/13 1247 Last data filed at 12/04/13 1100  Gross per 24 hour  Intake      0 ml  Output      2 ml  Net     -2 ml   General: resting in bed, NAD HEENT: PERRL, EOMI, no scleral icterus Cardiac: RRR, no rubs, murmurs or gallops Pulm: crackles at middle & lower left lung; egophony+ Abd: soft, nontender, nondistended, BS present Ext: warm and well perfused, no pedal edema Neuro: alert and oriented X3, cranial nerves II-XII grossly intact, strength and sensation to light touch equal in bilateral upper and lower extremities   Lab Results: Basic Metabolic Panel:  Recent Labs Lab 12/03/13 1203 12/04/13 0610  NA 136* 141  K 4.0 4.0  CL 97 103  CO2 25 24  GLUCOSE 142* 117*  BUN 10 12  CREATININE 0.93 0.96  CALCIUM 8.6 8.8   CBC:  Recent Labs Lab 12/03/13 1203 12/04/13 0610  WBC 8.8 5.3  NEUTROABS 6.9 3.2  HGB 13.9 13.5  HCT 41.8 39.6  MCV 92.1 89.0  PLT 183 207   CBG:  Recent Labs Lab 12/03/13 1608 12/03/13 2203 12/04/13 0742 12/04/13 1123  GLUCAP 122* 141* 107* 144*   Micro Results: Recent Results (from the past 240 hour(s))  URINE CULTURE     Status: None   Collection Time    12/01/13  9:47 PM      Result Value Ref Range Status   Specimen Description URINE, CLEAN CATCH   Final   Special Requests NONE   Final   Culture  Setup Time     Final   Value: 12/01/2013 22:21     Performed at [place]     Final   Value: NO GROWTH     Performed at [facility]   Culture     Final   Value: NO GROWTH     Performed at [facility]   Report Status 12/03/2013 FINAL   Final  CULTURE, BLOOD (ROUTINE X 2)     Status: None   Collection Time    12/03/13  3:40 PM      Result Value Ref Range Status   Specimen Description BLOOD LEFT ANTECUBITAL   Final   Special Requests BOTTLES DRAWN AEROBIC AND ANAEROBIC 10CC   Final   Culture  Setup Time     Final   Value: 12/03/2013 20:06     Performed at [facility]   Culture     Final   Value:        BLOOD CULTURE RECEIVED NO GROWTH TO DATE CULTURE WILL BE HELD FOR 5 DAYS BEFORE ISSUING A FINAL NEGATIVE REPORT     Performed at [facility]   Report Status PENDING   Incomplete  CULTURE, BLOOD (ROUTINE X 2)     Status: None   Collection Time    12/03/13  3:55 PM      Result Value Ref Range Status   Specimen Description BLOOD LEFT HAND   Final   Special Requests BOTTLES DRAWN AEROBIC ONLY 10CC   Final   Culture  Setup Time     Final   Value: 12/03/2013 20:07     Performed at [facility]   Culture     Final   Value:        BLOOD CULTURE RECEIVED NO GROWTH TO DATE CULTURE WILL BE HELD FOR 5 DAYS BEFORE ISSUING A FINAL NEGATIVE REPORT     Performed at [facility]   Report Status PENDING   Incomplete   Studies/Results: Dg Chest 2 View  12/03/2013   CLINICAL DATA:  Fever and body aches and malaise  EXAM: CHEST  2 VIEW  COMPARISON:  PA and lateral chest of December 01, 2013  FINDINGS: Again demonstrated is alveolar density in the superior segment of the left lower lobe. This has increased in conspicuity. The right lung is clear. There is no pleural effusion. The heart and pulmonary vascularity are normal. The bony thorax is unremarkable.  IMPRESSION: Progressive left lower lobe pneumonia.   Electronically Signed   By: David  Martinique   On: 12/03/2013 09:54   Medications: I have reviewed  the patient's current medications. Scheduled Meds: . amLODipine  10 mg Oral Daily  . aspirin  81 mg Oral Daily  . atorvastatin  80 mg Oral q1800  . azithromycin  500 mg Oral Q24H  . cefTRIAXone (ROCEPHIN)  IV  1 g Intravenous Q24H  . heparin  5,000 Units Subcutaneous 3 times per day  . insulin aspart  0-9 Units Subcutaneous TID WC  . insulin glargine  14 Units Subcutaneous QHS  . lisinopril  40 mg Oral Daily  . pantoprazole  80 mg Oral Daily  . sodium chloride  3 mL Intravenous Q12H   Continuous Infusions:  PRN Meds:.sodium chloride, acetaminophen, acetaminophen, albuterol, azelastine, ondansetron (ZOFRAN) IV, ondansetron, sodium chloride Assessment/Plan: Principal Problem:   SIRS (systemic inflammatory response syndrome) Active Problems:   CAP (community acquired pneumonia)   HLD (hyperlipidemia)   BP (high blood pressure)   Obstructive apnea   Diabetes mellitus, type 2   Dean Brown is a 68 year old male with DM2, HLD, HTN, OSA, h/o prostate cancer who was hospitalized for SIRS (2/4 criteria) 2/2 CAP s/p abx x 2 days.  #SIRS 2/2 CAP s/p abx x 2 days: He met criteria with HR 104 & temperature 100.61F yesterday and met antibiotics for 2 days now showing improvement. Coverage is thus adequate with current treatment and should thus be continued. Legionella & Strep Ag negative. Cultures remain unremarkable. -Abx Day 3: Consider transition to PO -Give Tylenol prn for fever  -Continue pulse oximetry  -Monitor on telemetry   #HTN: Continue amlodipine 36m, lisinopril 411m  #DM2: Continue Lantus 14 units, sensitive SSI.  -Holding metformin 50084mID   #HLD: Continue home atorvastatin 61m42m#OSA: Continue CPAP   #FEN:  -Heart Healthy/Carb Modified   #DVT prophylaxis: heparin 5000 units subcutaneous   Dispo: Disposition is deferred at this time, awaiting improvement of current medical problems.  Anticipated discharge in approximately 1-2 day(s).   The patient does have a  current PCP (KatMarijean Heath) and [facility].  The patient  does not have transportation limitations that hinder transportation to clinic appointments.  .Services Needed at time of discharge: Y = Yes, Blank = No PT:   OT:   RN:   Equipment:   Other:     LOS: 1 day   Charlott Rakes, MD 12/04/2013, 12:47 PM

## 2013-12-04 NOTE — Progress Notes (Signed)
UR Completed Galia Rahm Graves-Bigelow, RN,BSN [phone]  

## 2013-12-05 LAB — FOLATE RBC: RBC Folate: 537 ng/mL (ref >280)

## 2013-12-05 NOTE — Progress Notes (Signed)
Please see assessment and plan noted on discharge summary

## 2013-12-05 NOTE — H&P (Signed)
  Date: 12/05/2013  Patient name: Dean Brown  Medical record number: [id number]  Date of birth: 1946/04/05   I have seen and evaluated Dean Brown and discussed their care with the Residency Team.   Assessment and Plan: I have seen and evaluated the patient as outlined above. I agree with the formulated Assessment and Plan as detailed in the residents' admission note, with the following changes:   Mr. Dean Brown was admitted for SIRS due to CAP. He was started on ceftriaxone and azithromycin and observed overnight to see if he was still having fevers. We did not feel this was failure to treatment since he was roughly at 48hrs of treatment.  Dean Brown Dean Brown 12/04/13

## 2013-12-05 NOTE — Discharge Summary (Signed)
  Date: 12/05/2013  Patient name: Dean Brown  Medical record number: [id number]  Date of birth: [birth year]   This patient has been seen and the plan of care was discussed with the house staff. Please see their note for complete details. I concur with their findings with the following additions/corrections:  [name] was admitted with SIRS due to fever, tachycardia shortly after initiation of antibiotics for CAP. He was still feeling poorly and concerned that he was not responding to antibiotics. He was admitted continued on IV formulation with ceftriaxone and oral azithromycin. He reported feeling better after being admitted overnight, no longer having fever, chills, nightsweats. No cough, no dyspnea. Due to being afebrile, we decided to switch to oral therapy and discharge home - roughly 24 hr admission.  Carlyle Basques, MD 12/05/2013, 9:52 AM

## 2013-12-09 LAB — CULTURE, BLOOD (ROUTINE X 2)
CULTURE: NO GROWTH
CULTURE: NO GROWTH

## 2013-12-11 ENCOUNTER — Ambulatory Visit (INDEPENDENT_AMBULATORY_CARE_PROVIDER_SITE_OTHER): Payer: Medicare Other | Admitting: Internal Medicine

## 2013-12-11 ENCOUNTER — Ambulatory Visit: Payer: TRICARE For Life (TFL) | Admitting: Internal Medicine

## 2013-12-11 ENCOUNTER — Encounter: Payer: Self-pay | Admitting: Internal Medicine

## 2013-12-11 VITALS — BP 136/87 | HR 69 | Temp 98.4°F | Ht 66.0 in | Wt 169.5 lb

## 2013-12-11 DIAGNOSIS — E119 Type 2 diabetes mellitus without complications: Secondary | ICD-10-CM

## 2013-12-11 DIAGNOSIS — J189 Pneumonia, unspecified organism: Secondary | ICD-10-CM

## 2013-12-11 NOTE — Patient Instructions (Signed)
It was a pleasure taking care of you today, Dean Brown.  1. Pneumonia - Please follow up with your primary care doctor at [facility] physical exam was normal and lungs sound much better  General Instructions:   Please bring your medicines with you each time you come to clinic.  Medicines may include prescription medications, over-the-counter medications, herbal remedies, eye drops, vitamins, or other pills.   Progress Toward Treatment Goals:  No flowsheet data found.  Self Care Goals & Plans:  No flowsheet data found.  No flowsheet data found.   Care Management & Community Referrals:  No flowsheet data found.

## 2013-12-11 NOTE — Progress Notes (Signed)
   Subjective:    Patient ID: Dean Brown, male    DOB: Aug 14, 1945, 68 y.o.   MRN: [id number]  HPI Dean Brown is a 68yo man w/ PMHx of HTN, Type 2 DM, HLD, h/o prostate cancer s/p prostatectomy in 2006, and recent admission on 12/03/13 for CAP who presents today for a hospital follow-up.  Patient was admitted from 8/12-8/13 for community acquired pneumonia. He was found to have a LLL infiltrate on CXR. He was discharged on Azithromycin 500 mg and Cefdinir 300 mg which he completed on 8/15. He reports a dry cough, but otherwise feels he is in good health. He denies fever, chills, SOB, wheezing, nausea, vomiting, productive cough, poor appetite. He has a primary care doctor at [facility] which he is scheduled to see in 1 month.    Review of Systems General: Denies night sweats, changes in weight HEENT: Denies headaches, ear pain, changes in vision, rhinorrhea, sore throat CV: Denies CP, palpitations, orthopnea Pulm: See HPI GI: Denies abdominal pain, diarrhea, constipation, melena, hematochezia GU: Denies dysuria, hematuria, frequency Msk: Denies muscle cramps, joint pains Neuro: Denies weakness, numbness, tingling Skin: Denies rashes, bruising    Objective:   Physical Exam General: appears stated age, sitting up in chair, NAD HEENT: [place]/AT, EOMI, PERRL, sclera anicteric, pharynx non-erythematous, mucus membranes moist Neck: supple, no JVD, no lymphadenopathy CV: RRR, normal S1/S2, no m/g/r Pulm: CTA bilaterally, breaths non-labored, no wheezing Abd: BS+, soft, non-distended, non-tender Ext: warm, no edema, moves all Neuro: alert and oriented x 3, CNs II-XII intact, strength 5/5 in upper and lower extremities bilaterally       Assessment & Plan:

## 2013-12-11 NOTE — Assessment & Plan Note (Signed)
Pt presented today for hospital follow-up. He completed his antibiotic course of 6 days of Azithromycin 500 mg and Cefdinir 300 mg. He reports a dry cough but otherwise feels in good health. He is scheduled to see his PCP at [facility] in 1 month.

## 2013-12-16 NOTE — Addendum Note (Signed)
Addended by: Gilles Chiquito B on: 12/16/2013 03:37 PM   Modules accepted: Level of Service

## 2013-12-16 NOTE — Progress Notes (Signed)
Internal Medicine Clinic Attending Date of visit: 12/11/2013  I saw and evaluated the patient.  I personally confirmed the key portions of the history and exam documented by Dr. Arcelia Jew and I reviewed pertinent patient test results.  The assessment, diagnosis, and plan were formulated together and I agree with the documentation in the resident's note.

## 2014-05-18 DIAGNOSIS — N3946 Mixed incontinence: Secondary | ICD-10-CM | POA: Insufficient documentation

## 2014-10-19 ENCOUNTER — Other Ambulatory Visit: Payer: Self-pay

## 2014-11-16 DIAGNOSIS — R310 Gross hematuria: Secondary | ICD-10-CM | POA: Insufficient documentation

## 2015-07-17 IMAGING — CR DG CHEST 2V
2 series · 2 of 2 positions shown · non-contrast
Comparison: PA and lateral chest of December 01, 2013

CLINICAL DATA: Fever and body aches and malaise

EXAM:
CHEST  2 VIEW

[view not recorded (1 of 2)]
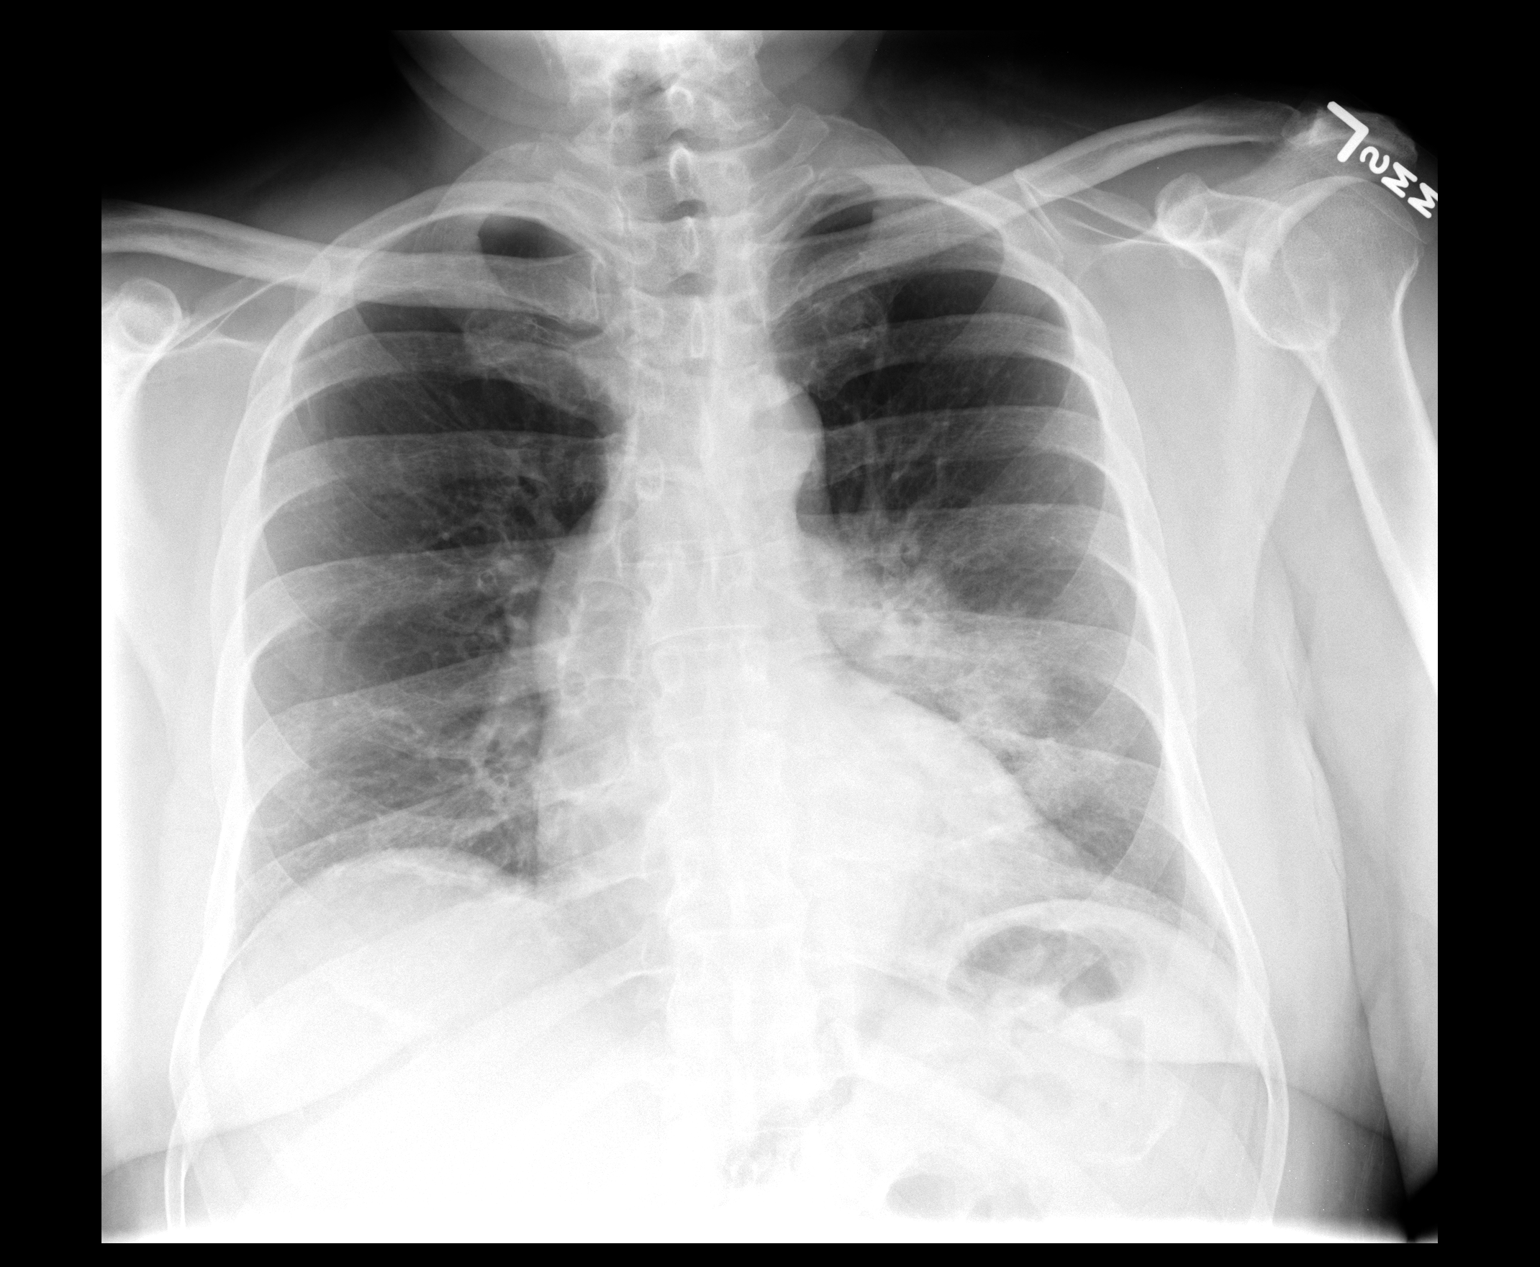

[view not recorded (2 of 2)]
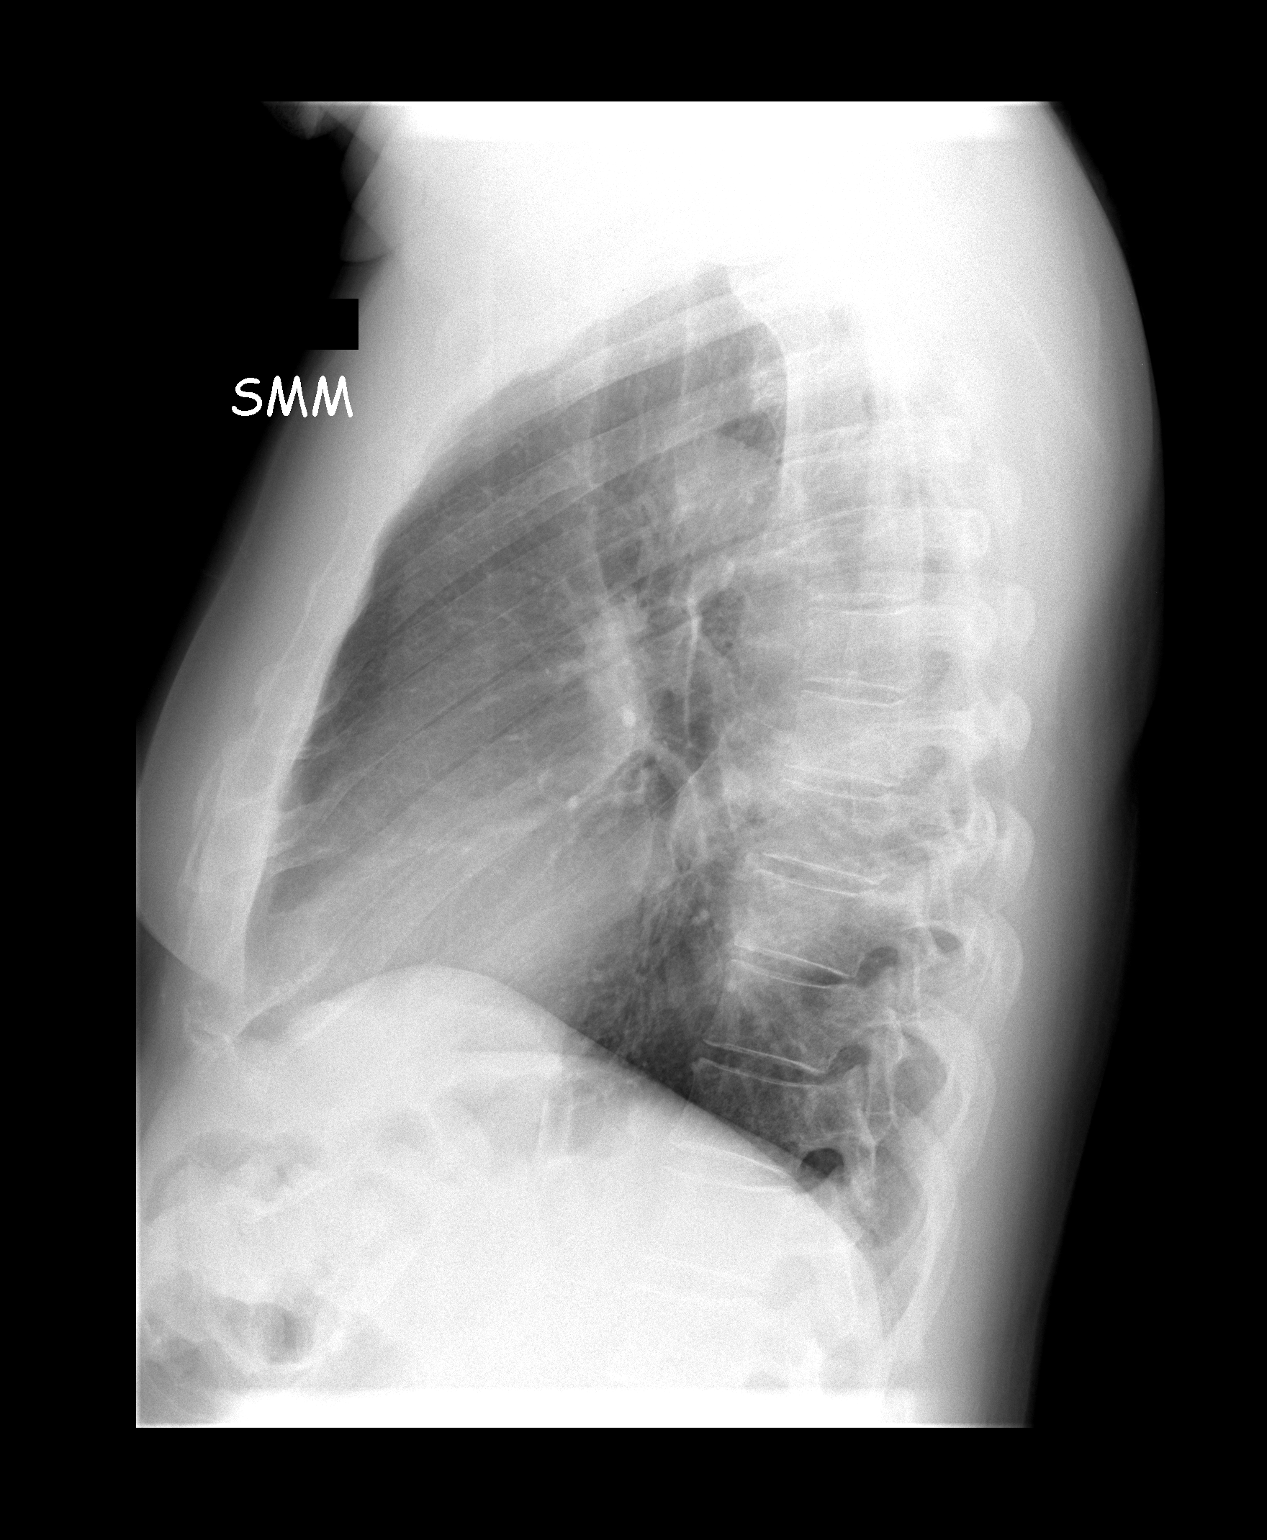

[2 of 2 positions shown; findings below may reference images not displayed]

FINDINGS: Again demonstrated is alveolar density in the superior segment of
the left lower lobe. This has increased in conspicuity. The right
lung is clear. There is no pleural effusion. The heart and pulmonary
vascularity are normal. The bony thorax is unremarkable.
IMPRESSION: Progressive left lower lobe pneumonia.

## 2016-03-08 ENCOUNTER — Other Ambulatory Visit: Payer: Self-pay | Admitting: Neurosurgery

## 2016-04-03 DIAGNOSIS — M48062 Spinal stenosis, lumbar region with neurogenic claudication: Secondary | ICD-10-CM | POA: Insufficient documentation

## 2016-04-26 ENCOUNTER — Encounter (HOSPITAL_COMMUNITY): Payer: Self-pay | Admitting: Vascular Surgery

## 2016-04-27 ENCOUNTER — Inpatient Hospital Stay (HOSPITAL_COMMUNITY): Admission: RE | Admit: 2016-04-27 | Payer: TRICARE For Life (TFL) | Source: Ambulatory Visit | Admitting: Neurosurgery

## 2016-04-27 ENCOUNTER — Encounter (HOSPITAL_COMMUNITY): Admission: RE | Payer: Self-pay | Source: Ambulatory Visit

## 2016-04-27 SURGERY — LUMBAR LAMINECTOMY/DECOMPRESSION MICRODISCECTOMY 1 LEVEL
Anesthesia: General | Laterality: Right

## 2016-11-21 ENCOUNTER — Ambulatory Visit (INDEPENDENT_AMBULATORY_CARE_PROVIDER_SITE_OTHER): Payer: Medicare Other | Admitting: Sports Medicine

## 2016-11-21 ENCOUNTER — Ambulatory Visit (INDEPENDENT_AMBULATORY_CARE_PROVIDER_SITE_OTHER): Payer: Medicare Other

## 2016-11-21 DIAGNOSIS — M2142 Flat foot [pes planus] (acquired), left foot: Secondary | ICD-10-CM

## 2016-11-21 DIAGNOSIS — B351 Tinea unguium: Secondary | ICD-10-CM | POA: Diagnosis not present

## 2016-11-21 DIAGNOSIS — M2141 Flat foot [pes planus] (acquired), right foot: Secondary | ICD-10-CM

## 2016-11-21 DIAGNOSIS — M79674 Pain in right toe(s): Secondary | ICD-10-CM | POA: Diagnosis not present

## 2016-11-21 DIAGNOSIS — M79675 Pain in left toe(s): Secondary | ICD-10-CM | POA: Diagnosis not present

## 2016-11-21 DIAGNOSIS — M722 Plantar fascial fibromatosis: Secondary | ICD-10-CM | POA: Diagnosis not present

## 2016-11-21 DIAGNOSIS — E1140 Type 2 diabetes mellitus with diabetic neuropathy, unspecified: Secondary | ICD-10-CM

## 2016-11-21 MED ORDER — TRIAMCINOLONE ACETONIDE 10 MG/ML IJ SUSP: 10.0000 mg | Freq: Once | INTRAMUSCULAR | Status: DC

## 2016-11-21 NOTE — Patient Instructions (Signed)

## 2016-11-21 NOTE — Progress Notes (Signed)
Subjective: Dean Brown is a 71 y.o. diabetic male patient presents to office with complaint of heel pain on the right. Patient admits to post static dyskinesia for years in duration and pain in both ankles. 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 Reports that the pain is most significant in his right heel, and ankles. States that he is also diabetic and is here for diabetic foot care. Denies any other pedal complaints.   Fasting blood sugar less than 120  Patient Active Problem List   Diagnosis Date Noted  . SIRS (systemic inflammatory response syndrome) ([place]) 12/03/2013  . CAP (community acquired pneumonia) 12/03/2013  . Acid reflux 12/03/2013  . BP (high blood pressure) 12/03/2013  . Arthritis, degenerative 12/03/2013  . Diabetes mellitus, type 2 ([place]) 12/03/2013  . HLD (hyperlipidemia) 03/08/2012  . Obstructive apnea 08/04/2011  . ACHILLES TENDINITIS 11/10/2008  . FOOT PAIN, LEFT 11/10/2008  . PES PLANUS 11/10/2008  . HALLUX VALGUS 11/10/2008    Current Outpatient Prescriptions on File Prior to Visit  Medication Sig Dispense Refill  . amLODipine (NORVASC) 10 MG tablet Take 10 mg by mouth daily.    Marland Kitchen aspirin 81 MG chewable tablet Chew 81 mg by mouth daily.    Marland Kitchen atorvastatin (LIPITOR) 80 MG tablet Take 80 mg by mouth daily.    Marland Kitchen azelastine (ASTELIN) 137 MCG/SPRAY nasal spray Place 1 spray into both nostrils 2 (two) times daily as needed for rhinitis. Use in each nostril as directed    . lisinopril (PRINIVIL,ZESTRIL) 40 MG tablet Take 40 mg by mouth daily.    . metFORMIN (GLUCOPHAGE-XR) 500 MG 24 hr tablet Take 500 mg by mouth daily with breakfast.    . omeprazole (PRILOSEC) 20 MG capsule Take 20 mg by mouth daily.    . furosemide (LASIX) 20 MG tablet Take 20 mg by mouth daily as needed for edema.    Marland Kitchen glucagon (GLUCAGEN) 1 MG SOLR Inject 1 mg into the vein once as needed (severely low blood sugar).    Marland Kitchen HYDROcodone-acetaminophen (NORCO/VICODIN) 5-325 MG  per tablet 1 to 2 tabs every 4 to 6 hours as needed for pain. (Patient not taking: Reported on 11/21/2016) 20 tablet 0  . insulin glargine (LANTUS) 100 UNIT/ML injection Inject 22 Units into the skin at bedtime.    . naproxen (NAPROSYN) 500 MG tablet Take 500 mg by mouth 2 (two) times daily as needed.     . traMADol (ULTRAM) 50 MG tablet Take 50 mg by mouth every 6 (six) hours as needed for moderate pain.     No current facility-administered medications on file prior to visit.     Allergies  Allergen Reactions  . No Known Allergies     Objective: Physical Exam General: The patient is alert and oriented x3 in no acute distress.  Dermatology: Skin is warm, dry and supple bilateral lower extremities. Nails 1-10 are Thickened, elongated, dystrophic, consistent with onychomycosis. There is no erythema, edema, no eccymosis, no open lesions present. Integument is otherwise unremarkable.  Vascular: Dorsalis Pedis pulse and Posterior Tibial pulse are 1/4 bilateral. Capillary fill time is immediate to all digits.  Neurological: Grossly intact to light touch with an achilles reflex of +2/5 and a  negative Tinel's sign bilateral. Protective sensation within normal limits bilateral.  Musculoskeletal: Tenderness to palpation at the medial calcaneal tubercale and through the insertion of the plantar fascia on the right foot. No pain with compression of calcaneus bilateral. No pain with  tuning fork to calcaneus bilateral. No pain with calf compression bilateral. There is decreased Ankle joint range of motion bilateral. All other joints range of motion within normal limits bilateral. Pes planus with ankle valgus. Strength 5/5 in all groups bilateral.   Gait: Unassisted, Antalgic avoid weight on right heel  Xray, Right/Left foot:  Normal osseous mineralization. Joint spaces preserved except at first MPJ, midfoot, where there is midtarsal breach, suggestive of pes planus and ankle, suggestive of arthritis.  No fracture/dislocation/boney destruction. Calcaneal spur present with mild thickening of plantar fascia. No other soft tissue abnormalities or radiopaque foreign bodies.   Assessment and Plan: Problem List Items Addressed This Visit      Endocrine   Diabetes mellitus, type 2 ([place])   Relevant Medications   SOLIQUA 100-33 UNT-MCG/ML SOPN    Other Visit Diagnoses    Pes planus of both feet    -  Primary   Relevant Orders   DG Foot Complete Right (Completed)   DG Foot Complete Left (Completed)   Plantar fasciitis       right   Relevant Medications   triamcinolone acetonide (KENALOG) 10 MG/ML injection 10 mg   Pain due to onychomycosis of toenails of both feet          -Complete examination performed.  -Xrays reviewed -Encouraged diabetic foot inspection daily -Mechanically debrided nails 10 using sterile nail nipper without incident -Discussed with patient in detail the condition of Arthritis and plantar fasciitis, how this occurs and general treatment options. Explained both conservative and surgical treatments.  -After oral consent and aseptic prep, injected a mixture containing 1 ml of 2%  plain lidocaine, 1 ml 0.5% plain marcaine, 0.5 ml of kenalog 10 and 0.5 ml of dexamethasone phosphate into right heel. Post-injection care discussed with patient.  -Recommended good supportive shoes and advised use of diabetic shoes and insoles - Explained in detail the use of the fascial brace for the right which was dispensed at today's visit. -Explained and dispensed to patient daily stretching exercises. -Recommend patient to ice affected area 1-2x daily. -Patient to return to office in 4 weeks for follow up or sooner if problems or questions arise.  Landis Martins, DPM

## 2016-11-21 NOTE — Progress Notes (Signed)
   Subjective:    Patient ID: Dean Brown, male    DOB: [birth year], 71 y.o.   MRN: [id number]  HPI   I am diabetic and have foot ankle pain for years.  Was in the [place] for 22 years.    Review of Systems  Constitutional: Positive for chills and diaphoresis.  HENT: Positive for tinnitus.   Musculoskeletal: Positive for arthralgias, back pain and myalgias.  All other systems reviewed and are negative.      Objective:   Physical Exam        Assessment & Plan:

## 2016-12-19 ENCOUNTER — Ambulatory Visit (INDEPENDENT_AMBULATORY_CARE_PROVIDER_SITE_OTHER): Payer: Medicare Other | Admitting: Sports Medicine

## 2016-12-19 ENCOUNTER — Encounter: Payer: Self-pay | Admitting: Sports Medicine

## 2016-12-19 DIAGNOSIS — M722 Plantar fascial fibromatosis: Secondary | ICD-10-CM | POA: Diagnosis not present

## 2016-12-19 DIAGNOSIS — M2141 Flat foot [pes planus] (acquired), right foot: Secondary | ICD-10-CM

## 2016-12-19 DIAGNOSIS — E1140 Type 2 diabetes mellitus with diabetic neuropathy, unspecified: Secondary | ICD-10-CM

## 2016-12-19 DIAGNOSIS — M2142 Flat foot [pes planus] (acquired), left foot: Secondary | ICD-10-CM

## 2016-12-19 MED ORDER — TRIAMCINOLONE ACETONIDE 10 MG/ML IJ SUSP: 10.0000 mg | Freq: Once | INTRAMUSCULAR | Status: DC

## 2016-12-19 NOTE — Progress Notes (Signed)
Subjective: Dean Brown is a 71 y.o. diabetic male returns to office for follow up evaluation after Right>Left heel injection for plantar fasciitis, injection #1 on right administered 3 weeks ago. Patient states that the injection seems to help his pain; pain is present worse at end of day; reports that pain in the morning is better. Patient denies any recent changes in medications or new problems since last visit.   FBS this AM 141  Patient Active Problem List   Diagnosis Date Noted  . SIRS (systemic inflammatory response syndrome) ([place]) 12/03/2013  . CAP (community acquired pneumonia) 12/03/2013  . Acid reflux 12/03/2013  . BP (high blood pressure) 12/03/2013  . Arthritis, degenerative 12/03/2013  . Diabetes mellitus, type 2 ([place]) 12/03/2013  . HLD (hyperlipidemia) 03/08/2012  . Obstructive apnea 08/04/2011  . ACHILLES TENDINITIS 11/10/2008  . FOOT PAIN, LEFT 11/10/2008  . PES PLANUS 11/10/2008  . HALLUX VALGUS 11/10/2008    Current Outpatient Prescriptions on File Prior to Visit  Medication Sig Dispense Refill  . amLODipine (NORVASC) 10 MG tablet Take 10 mg by mouth daily.    Marland Kitchen aspirin 81 MG chewable tablet Chew 81 mg by mouth daily.    Marland Kitchen atorvastatin (LIPITOR) 80 MG tablet Take 80 mg by mouth daily.    Marland Kitchen azelastine (ASTELIN) 137 MCG/SPRAY nasal spray Place 1 spray into both nostrils 2 (two) times daily as needed for rhinitis. Use in each nostril as directed    . DULoxetine (CYMBALTA) 30 MG capsule TK ONE C PO  BID FOR DEPRESSION AND PAIN    . furosemide (LASIX) 20 MG tablet Take 20 mg by mouth daily as needed for edema.    . gabapentin (NEURONTIN) 300 MG capsule Take by mouth.    Marland Kitchen glucagon (GLUCAGEN) 1 MG SOLR Inject 1 mg into the vein once as needed (severely low blood sugar).    . insulin glargine (LANTUS) 100 UNIT/ML injection Inject 22 Units into the skin at bedtime.    Marland Kitchen lisinopril (PRINIVIL,ZESTRIL) 40 MG tablet Take 40 mg by mouth daily.    . metFORMIN  (GLUCOPHAGE-XR) 500 MG 24 hr tablet Take 500 mg by mouth daily with breakfast.    . naproxen (NAPROSYN) 500 MG tablet Take 500 mg by mouth 2 (two) times daily as needed.     Marland Kitchen omeprazole (PRILOSEC) 20 MG capsule Take 20 mg by mouth daily.    . SOLIQUA 100-33 UNT-MCG/ML SOPN INJ 23 UNITS [place] ONCE A DAY  6  . traMADol (ULTRAM) 50 MG tablet Take 50 mg by mouth every 6 (six) hours as needed for moderate pain.    Marland Kitchen zolpidem (AMBIEN) 5 MG tablet Take by mouth.     Current Facility-Administered Medications on File Prior to Visit  Medication Dose Route Frequency Provider Last Rate Last Dose  . triamcinolone acetonide (KENALOG) 10 MG/ML injection 10 mg  10 mg Other Once Landis Martins, DPM        Allergies  Allergen Reactions  . No Known Allergies     Objective:   General:  Alert and oriented x 3, in no acute distress  Dermatology: Skin is warm, dry, and supple bilateral. Nails are within normal limits. There is no lower extremity erythema, no eccymosis, no open lesions present bilateral.   Vascular: Dorsalis Pedis and Posterior Tibial pedal pulses are 1/4 bilateral. + hair growth noted bilateral. Capillary Fill Time is 3 seconds in all digits. No varicosities, No edema bilateral lower extremities.   Neurological: Sensation grossly intact  to light touch with an achilles reflex of +2 and a  negative Tinel's sign bilateral. Vibratory, sharp/dull, Semmes Weinstein Monofilament within normal limits.   Musculoskeletal: There is decreased tenderness to palpation at the medial calcaneal tubercale and through the insertion of the plantar fascia bilateral however right side is still more tender than the left. No pain with compression to calcaneus or application of tuning fork. There is decreased Ankle joint range of motion bilateral. All other jointsrange of motion  within normal limits bilateral. Pes planus bilateral. Strength 5/5 bilateral.   Assessment and Plan: Problem List Items Addressed This  Visit      Endocrine   Diabetes mellitus, type 2 ([facility])   Relevant Medications   triamcinolone acetonide (KENALOG) 10 MG/ML injection 10 mg    Other Visit Diagnoses    Plantar fasciitis    -  Primary   Relevant Medications   triamcinolone acetonide (KENALOG) 10 MG/ML injection 10 mg   Pes planus of both feet       Relevant Medications   triamcinolone acetonide (KENALOG) 10 MG/ML injection 10 mg     -Complete examination performed.  -Previous x-rays reviewed. -Discussed with patient in detail the condition of plantar fasciitis, how this  occurs related to the foot type of the patient and general treatment options. - Patient opted for another injection today; After oral consent and aseptic prep, injected a mixture containing 1 ml of 1%plain lidocaine, 1 ml 0.5% plain marcaine, 0.5 ml of kenalog 10 and 0.5 ml of dexmethasone phosphate to right and left heel at area of most pain/trigger point injection. -Dispensed night splint to alternate between left and right foot -Continue with fascial brace alternating as instructed  -Continue with stretching, icing, good supportive shoes, inserts daily.  -Discussed long term care and reocurrence; will closely monitor; if fails to improve will consider other treatment modalities.  -Patient to return to office in 1 month for follow up or sooner if problems or questions arise.  Landis Martins, DPM

## 2017-01-23 DIAGNOSIS — N529 Male erectile dysfunction, unspecified: Secondary | ICD-10-CM | POA: Insufficient documentation

## 2017-01-30 ENCOUNTER — Encounter: Payer: Self-pay | Admitting: Sports Medicine

## 2017-01-30 ENCOUNTER — Ambulatory Visit (INDEPENDENT_AMBULATORY_CARE_PROVIDER_SITE_OTHER): Payer: Medicare Other | Admitting: Sports Medicine

## 2017-01-30 ENCOUNTER — Telehealth: Payer: Self-pay | Admitting: *Deleted

## 2017-01-30 DIAGNOSIS — M722 Plantar fascial fibromatosis: Secondary | ICD-10-CM

## 2017-01-30 DIAGNOSIS — M2141 Flat foot [pes planus] (acquired), right foot: Secondary | ICD-10-CM | POA: Diagnosis not present

## 2017-01-30 DIAGNOSIS — E1140 Type 2 diabetes mellitus with diabetic neuropathy, unspecified: Secondary | ICD-10-CM | POA: Diagnosis not present

## 2017-01-30 DIAGNOSIS — M7661 Achilles tendinitis, right leg: Secondary | ICD-10-CM

## 2017-01-30 DIAGNOSIS — M2142 Flat foot [pes planus] (acquired), left foot: Secondary | ICD-10-CM

## 2017-01-30 DIAGNOSIS — M7662 Achilles tendinitis, left leg: Secondary | ICD-10-CM

## 2017-01-30 NOTE — Telephone Encounter (Addendum)
-----   Message from Landis Martins, [place] sent at 01/30/2017  9:08 AM EDT ----- Regarding: PT at [facility] on PT for plantar fasciitis and achilles tendonitis  Range of motion, iotophroesis, tens, strenghtening, gait, any other modaliites as needed. Faxed required form, and demographics to [facility].

## 2017-01-30 NOTE — Progress Notes (Signed)
Subjective: Dean Brown is a 71 y.o. diabetic male returns to office for follow up evaluation after Right>Left heel injection for plantar fasciitis, injection #2 on right administered 4 weeks ago. Patient states that the injection seems to help his pain; pain is present worse at end of day; reports that pain in the morning is better but now pain at the back of R>L heel at the back, Reports that he is in PT for other things but pain getting worse at back of heel. Patient denies any recent changes in medications or new problems since last visit.   FBS 117 yesterday   Patient Active Problem List   Diagnosis Date Noted  . SIRS (systemic inflammatory response syndrome) ([place]) 12/03/2013  . CAP (community acquired pneumonia) 12/03/2013  . Acid reflux 12/03/2013  . BP (high blood pressure) 12/03/2013  . Arthritis, degenerative 12/03/2013  . Diabetes mellitus, type 2 ([place]) 12/03/2013  . HLD (hyperlipidemia) 03/08/2012  . Obstructive apnea 08/04/2011  . ACHILLES TENDINITIS 11/10/2008  . FOOT PAIN, LEFT 11/10/2008  . PES PLANUS 11/10/2008  . HALLUX VALGUS 11/10/2008    Current Outpatient Prescriptions on File Prior to Visit  Medication Sig Dispense Refill  . amLODipine (NORVASC) 10 MG tablet Take 10 mg by mouth daily.    Marland Kitchen aspirin 81 MG chewable tablet Chew 81 mg by mouth daily.    Marland Kitchen atorvastatin (LIPITOR) 80 MG tablet Take 80 mg by mouth daily.    Marland Kitchen azelastine (ASTELIN) 137 MCG/SPRAY nasal spray Place 1 spray into both nostrils 2 (two) times daily as needed for rhinitis. Use in each nostril as directed    . DULoxetine (CYMBALTA) 30 MG capsule TK ONE C PO  BID FOR DEPRESSION AND PAIN    . furosemide (LASIX) 20 MG tablet Take 20 mg by mouth daily as needed for edema.    . gabapentin (NEURONTIN) 300 MG capsule Take by mouth.    Marland Kitchen glucagon (GLUCAGEN) 1 MG SOLR Inject 1 mg into the vein once as needed (severely low blood sugar).    . insulin glargine (LANTUS) 100 UNIT/ML injection Inject 22  Units into the skin at bedtime.    Marland Kitchen lisinopril (PRINIVIL,ZESTRIL) 40 MG tablet Take 40 mg by mouth daily.    . metFORMIN (GLUCOPHAGE-XR) 500 MG 24 hr tablet Take 500 mg by mouth daily with breakfast.    . naproxen (NAPROSYN) 500 MG tablet Take 500 mg by mouth 2 (two) times daily as needed.     Marland Kitchen omeprazole (PRILOSEC) 20 MG capsule Take 20 mg by mouth daily.    . SOLIQUA 100-33 UNT-MCG/ML SOPN INJ 23 UNITS [place] ONCE A DAY  6  . traMADol (ULTRAM) 50 MG tablet Take 50 mg by mouth every 6 (six) hours as needed for moderate pain.    Marland Kitchen zolpidem (AMBIEN) 5 MG tablet Take by mouth.     Current Facility-Administered Medications on File Prior to Visit  Medication Dose Route Frequency Provider Last Rate Last Dose  . triamcinolone acetonide (KENALOG) 10 MG/ML injection 10 mg  10 mg Other Once Landis Martins, DPM      . triamcinolone acetonide (KENALOG) 10 MG/ML injection 10 mg  10 mg Other Once Landis Martins, DPM        Allergies  Allergen Reactions  . No Known Allergies     Objective:   General:  Alert and oriented x 3, in no acute distress  Dermatology: Skin is warm, dry, and supple bilateral. Nails are within normal limits. There is  no lower extremity erythema, no eccymosis, no open lesions present bilateral.   Vascular: Dorsalis Pedis and Posterior Tibial pedal pulses are 1/4 bilateral. + hair growth noted bilateral. Capillary Fill Time is 3 seconds in all digits. No varicosities, No edema bilateral lower extremities.   Neurological: Sensation grossly intact to light touch with an achilles reflex of +2 and a  negative Tinel's sign bilateral. Vibratory, sharp/dull, Semmes Weinstein Monofilament within normal limits.   Musculoskeletal: There is decreased tenderness to palpation at the medial calcaneal tubercale and through the insertion of the plantar fascia bilateral however right side is still more tender than the left. + pain to achilles insertion R>L with no gap/dell. No pain with  compression to calcaneus or application of tuning fork. There is decreased Ankle joint range of motion bilateral. All other jointsrange of motion  within normal limits bilateral. Pes planus bilateral. Strength 5/5 bilateral.   Assessment and Plan: Problem List Items Addressed This Visit    None    Visit Diagnoses    Plantar fasciitis    -  Primary   Achilles tendonitis, bilateral       Pes planus of both feet       Type 2 diabetes mellitus with diabetic neuropathy, without long-term current use of insulin (HCC)         -Complete examination performed.  -Previous x-rays reviewed. -Re-Discussed with patient in detail the condition of plantar fasciitis with now achilles tendonitis, how this occurs related to the foot type of the patient and general treatment options. - Rx PT at [facility] for achilles and plantar fasciitis with treatment modalities  -Continue with night splint to alternate between left and right foot -Continue with fascial brace alternating as instructed previously  -Continue with stretching, icing, good supportive shoes, inserts daily and heel lifts as dispensed at today's visit.  -Discussed long term care and reocurrence; will closely monitor; if fails to improve will consider other treatment modalities.  -Patient to return to office in 4-6 weeks  for follow up or sooner if problems or questions arise.  Landis Martins, DPM

## 2017-02-02 ENCOUNTER — Telehealth: Payer: Self-pay | Admitting: Sports Medicine

## 2017-02-02 NOTE — Telephone Encounter (Signed)
Called pt and left message on voicemail to let him know that his requested medical records and CD of x-rays are at the front desk for him to pick up. I informed him in the message that there is a $5.00 fee for the CD of x-rays as well as our hours of operation. Told pt to call me with any questions at [phone].

## 2017-02-27 ENCOUNTER — Ambulatory Visit (INDEPENDENT_AMBULATORY_CARE_PROVIDER_SITE_OTHER): Payer: Medicare Other | Admitting: Sports Medicine

## 2017-02-27 ENCOUNTER — Encounter: Payer: Self-pay | Admitting: Sports Medicine

## 2017-02-27 DIAGNOSIS — M7661 Achilles tendinitis, right leg: Secondary | ICD-10-CM

## 2017-02-27 DIAGNOSIS — E1140 Type 2 diabetes mellitus with diabetic neuropathy, unspecified: Secondary | ICD-10-CM

## 2017-02-27 DIAGNOSIS — M7662 Achilles tendinitis, left leg: Secondary | ICD-10-CM

## 2017-02-27 DIAGNOSIS — M722 Plantar fascial fibromatosis: Secondary | ICD-10-CM

## 2017-02-27 DIAGNOSIS — M2142 Flat foot [pes planus] (acquired), left foot: Secondary | ICD-10-CM

## 2017-02-27 DIAGNOSIS — M2141 Flat foot [pes planus] (acquired), right foot: Secondary | ICD-10-CM

## 2017-02-27 MED ORDER — TRIAMCINOLONE ACETONIDE 10 MG/ML IJ SUSP: 10.0000 mg | Freq: Once | INTRAMUSCULAR | Status: DC

## 2017-02-27 NOTE — Progress Notes (Signed)
Subjective: Dean Brown is a 71 y.o. diabetic male returns to office for follow up evaluation after Right and Left heel pain. Patient states that he is in PT and that the heels feel better but right is still painful 5/10 throbbing in nature. Patient denies any recent changes in medications or new problems since last visit.   FBS not checked this AM   Patient Active Problem List   Diagnosis Date Noted  . SIRS (systemic inflammatory response syndrome) ([place]) 12/03/2013  . CAP (community acquired pneumonia) 12/03/2013  . Acid reflux 12/03/2013  . BP (high blood pressure) 12/03/2013  . Arthritis, degenerative 12/03/2013  . Diabetes mellitus, type 2 ([place]) 12/03/2013  . HLD (hyperlipidemia) 03/08/2012  . Obstructive apnea 08/04/2011  . ACHILLES TENDINITIS 11/10/2008  . FOOT PAIN, LEFT 11/10/2008  . PES PLANUS 11/10/2008  . HALLUX VALGUS 11/10/2008    Current Outpatient Medications on File Prior to Visit  Medication Sig Dispense Refill  . amLODipine (NORVASC) 10 MG tablet Take 10 mg by mouth daily.    Marland Kitchen aspirin 81 MG chewable tablet Chew 81 mg by mouth daily.    Marland Kitchen atorvastatin (LIPITOR) 80 MG tablet Take 80 mg by mouth daily.    Marland Kitchen azelastine (ASTELIN) 137 MCG/SPRAY nasal spray Place 1 spray into both nostrils 2 (two) times daily as needed for rhinitis. Use in each nostril as directed    . DULoxetine (CYMBALTA) 30 MG capsule TK ONE C PO  BID FOR DEPRESSION AND PAIN    . furosemide (LASIX) 20 MG tablet Take 20 mg by mouth daily as needed for edema.    . gabapentin (NEURONTIN) 300 MG capsule Take by mouth.    Marland Kitchen glucagon (GLUCAGEN) 1 MG SOLR Inject 1 mg into the vein once as needed (severely low blood sugar).    . insulin glargine (LANTUS) 100 UNIT/ML injection Inject 22 Units into the skin at bedtime.    Marland Kitchen lisinopril (PRINIVIL,ZESTRIL) 40 MG tablet Take 40 mg by mouth daily.    . metFORMIN (GLUCOPHAGE-XR) 500 MG 24 hr tablet Take 500 mg by mouth daily with breakfast.    . naproxen  (NAPROSYN) 500 MG tablet Take 500 mg by mouth 2 (two) times daily as needed.     Marland Kitchen omeprazole (PRILOSEC) 20 MG capsule Take 20 mg by mouth daily.    . SOLIQUA 100-33 UNT-MCG/ML SOPN INJ 23 UNITS [place] ONCE A DAY  6  . traMADol (ULTRAM) 50 MG tablet Take 50 mg by mouth every 6 (six) hours as needed for moderate pain.    Marland Kitchen zolpidem (AMBIEN) 5 MG tablet Take by mouth.     Current Facility-Administered Medications on File Prior to Visit  Medication Dose Route Frequency Provider Last Rate Last Dose  . triamcinolone acetonide (KENALOG) 10 MG/ML injection 10 mg  10 mg Other Once Landis Martins, DPM      . triamcinolone acetonide (KENALOG) 10 MG/ML injection 10 mg  10 mg Other Once Landis Martins, DPM        Allergies  Allergen Reactions  . No Known Allergies     Objective:   General:  Alert and oriented x 3, in no acute distress  Dermatology: Skin is warm, dry, and supple bilateral. Nails are within normal limits. There is no lower extremity erythema, no eccymosis, no open lesions present bilateral.   Vascular: Dorsalis Pedis and Posterior Tibial pedal pulses are 1/4 bilateral. + hair growth noted bilateral. Capillary Fill Time is 3 seconds in all digits. No varicosities,  No edema bilateral lower extremities.   Neurological: Sensation grossly intact to light touch with an achilles reflex of +2 and a  negative Tinel's sign bilateral. Vibratory, sharp/dull, Semmes Weinstein Monofilament within normal limits.   Musculoskeletal: There is decreased tenderness to palpation at the medial calcaneal tubercale and through the insertion of the plantar fascia bilateral R>L. + pain to achilles insertion R>L with no gap/dell. No pain with compression to calcaneus or application of tuning fork. There is decreased Ankle joint range of motion bilateral. All other joints range of motion  within normal limits bilateral. Pes planus bilateral. Strength 5/5 bilateral.   Assessment and Plan: Problem List Items  Addressed This Visit      Endocrine   Diabetes mellitus, type 2 ([place])    Other Visit Diagnoses    Plantar fasciitis    -  Primary   Relevant Medications   triamcinolone acetonide (KENALOG) 10 MG/ML injection 10 mg (Start on 02/27/2017  9:00 AM)   Achilles tendonitis, bilateral       Relevant Medications   triamcinolone acetonide (KENALOG) 10 MG/ML injection 10 mg (Start on 02/27/2017  9:00 AM)   Pes planus of both feet          -Complete examination performed.  -Re-Discussed with patient in detail the condition of plantar fasciitis with achilles tendonitis -After oral consent and aseptic prep, injected a mixture containing 1 ml of 2%  plain lidocaine, 1 ml 0.5% plain marcaine, 0.5 ml of kenalog 10 and 0.5 ml of dexamethasone phosphate into right heel along medial achilles insertion with care not to violate the tendon. Post-injection care discussed with patient.   -Continue with PT; May add on TENs unit -Continue with night splint to alternate between left and right foot -May wean from fascial braces as instructed after 1 month -Continue with stretching, icing, good supportive shoes, inserts daily and heel lifts -Discussed long term care and reocurrence; will closely monitor; if fails to improve will consider other treatment modalities.  -Patient to return to office in 4-6 weeks for follow up eval and diabetic nail trim or sooner if problems or questions arise.  Landis Martins, DPM

## 2017-04-03 ENCOUNTER — Ambulatory Visit: Payer: Medicare Other | Admitting: Sports Medicine

## 2017-04-30 ENCOUNTER — Telehealth: Payer: Self-pay | Admitting: Sports Medicine

## 2017-04-30 NOTE — Telephone Encounter (Signed)
This is [place] calling from [facility]. We have faxed over the plan of care dated 09 April 2017 a couple of times now and we haven't gotten it back yet signed by Dr. Cannon Kettle. I was just calling to check on it and make sure we have the correct fax number. If somebody would give me a call back at [phone]. Thank you.

## 2017-04-30 NOTE — Telephone Encounter (Signed)
I told [place] - [facility] Dr. Cannon Kettle had been out of the office for 2 weeks and would sign the PT orders at that time, but we could check our fax number. [place] states she has been faxing to [phone], and I told her that was correct.

## 2017-05-22 ENCOUNTER — Encounter: Payer: Self-pay | Admitting: Sports Medicine

## 2017-05-22 ENCOUNTER — Ambulatory Visit (INDEPENDENT_AMBULATORY_CARE_PROVIDER_SITE_OTHER): Payer: Medicare Other | Admitting: Sports Medicine

## 2017-05-22 DIAGNOSIS — B351 Tinea unguium: Secondary | ICD-10-CM | POA: Diagnosis not present

## 2017-05-22 DIAGNOSIS — M2142 Flat foot [pes planus] (acquired), left foot: Secondary | ICD-10-CM

## 2017-05-22 DIAGNOSIS — M79674 Pain in right toe(s): Secondary | ICD-10-CM

## 2017-05-22 DIAGNOSIS — M722 Plantar fascial fibromatosis: Secondary | ICD-10-CM

## 2017-05-22 DIAGNOSIS — M79675 Pain in left toe(s): Secondary | ICD-10-CM | POA: Diagnosis not present

## 2017-05-22 DIAGNOSIS — M7661 Achilles tendinitis, right leg: Secondary | ICD-10-CM

## 2017-05-22 DIAGNOSIS — M2141 Flat foot [pes planus] (acquired), right foot: Secondary | ICD-10-CM

## 2017-05-22 DIAGNOSIS — M7662 Achilles tendinitis, left leg: Secondary | ICD-10-CM

## 2017-05-22 DIAGNOSIS — E1140 Type 2 diabetes mellitus with diabetic neuropathy, unspecified: Secondary | ICD-10-CM

## 2017-05-22 NOTE — Progress Notes (Signed)
Subjective: Dean Brown is a 72 y.o. diabetic male returns to office for follow up evaluation after Right and Left heel pain. Patient had an injection last visit. States that the injection seemed to help his pain and that he completed PT. Reports pain is better since coming here. Patient reports he also wants diabetic shoes and his nails trim.Patient denies any recent changes in medications or new problems since last visit.   FBS not checked this AM   Patient Active Problem List   Diagnosis Date Noted  . SIRS (systemic inflammatory response syndrome) ([place]) 12/03/2013  . CAP (community acquired pneumonia) 12/03/2013  . Acid reflux 12/03/2013  . BP (high blood pressure) 12/03/2013  . Arthritis, degenerative 12/03/2013  . Diabetes mellitus, type 2 ([place]) 12/03/2013  . HLD (hyperlipidemia) 03/08/2012  . Obstructive apnea 08/04/2011  . ACHILLES TENDINITIS 11/10/2008  . FOOT PAIN, LEFT 11/10/2008  . PES PLANUS 11/10/2008  . HALLUX VALGUS 11/10/2008    Current Outpatient Medications on File Prior to Visit  Medication Sig Dispense Refill  . amLODipine (NORVASC) 10 MG tablet Take 10 mg by mouth daily.    Marland Kitchen aspirin 81 MG chewable tablet Chew 81 mg by mouth daily.    Marland Kitchen atorvastatin (LIPITOR) 80 MG tablet Take 80 mg by mouth daily.    Marland Kitchen azelastine (ASTELIN) 137 MCG/SPRAY nasal spray Place 1 spray into both nostrils 2 (two) times daily as needed for rhinitis. Use in each nostril as directed    . DULoxetine (CYMBALTA) 30 MG capsule TK ONE C PO  BID FOR DEPRESSION AND PAIN    . furosemide (LASIX) 20 MG tablet Take 20 mg by mouth daily as needed for edema.    . gabapentin (NEURONTIN) 300 MG capsule Take by mouth.    Marland Kitchen glucagon (GLUCAGEN) 1 MG SOLR Inject 1 mg into the vein once as needed (severely low blood sugar).    . insulin glargine (LANTUS) 100 UNIT/ML injection Inject 22 Units into the skin at bedtime.    Marland Kitchen lisinopril (PRINIVIL,ZESTRIL) 40 MG tablet Take 40 mg by mouth daily.    .  metFORMIN (GLUCOPHAGE-XR) 500 MG 24 hr tablet Take 500 mg by mouth daily with breakfast.    . naproxen (NAPROSYN) 500 MG tablet Take 500 mg by mouth 2 (two) times daily as needed.     Marland Kitchen omeprazole (PRILOSEC) 20 MG capsule Take 20 mg by mouth daily.    . SOLIQUA 100-33 UNT-MCG/ML SOPN INJ 23 UNITS [place] ONCE A DAY  6  . traMADol (ULTRAM) 50 MG tablet Take 50 mg by mouth every 6 (six) hours as needed for moderate pain.    Marland Kitchen zolpidem (AMBIEN) 5 MG tablet Take by mouth.     Current Facility-Administered Medications on File Prior to Visit  Medication Dose Route Frequency Provider Last Rate Last Dose  . triamcinolone acetonide (KENALOG) 10 MG/ML injection 10 mg  10 mg Other Once Landis Martins, DPM      . triamcinolone acetonide (KENALOG) 10 MG/ML injection 10 mg  10 mg Other Once Landis Martins, DPM      . triamcinolone acetonide (KENALOG) 10 MG/ML injection 10 mg  10 mg Other Once Landis Martins, DPM        Allergies  Allergen Reactions  . No Known Allergies     Objective:   General:  Alert and oriented x 3, in no acute distress  Dermatology: Skin is warm, dry, and supple bilateral. Nails are mildly thick and elonagted. There is no lower  extremity erythema, no eccymosis, no open lesions present bilateral.   Vascular: Dorsalis Pedis and Posterior Tibial pedal pulses are 1/4 bilateral. + hair growth noted bilateral. Capillary Fill Time is 3 seconds in all digits. No varicosities, No edema bilateral lower extremities.   Neurological: Sensation grossly intact to light touch with an achilles reflex of +2 and a  negative Tinel's sign bilateral. Vibratory, sharp/dull, Semmes Weinstein Monofilament within normal limits.   Musculoskeletal: There is no tenderness to palpation at the medial calcaneal tubercale and through the insertion of the plantar fascia bilateral. No pain to achilles insertion R>L with no gap/dell. No pain with compression to calcaneus or application of tuning fork. There is  decreased Ankle joint range of motion bilateral. All other joints range of motion  within normal limits bilateral except mild limitation at 1st MTPJ. Pes planus bilateral. Minimal pinch callus noted. Strength 5/5 bilateral.   Assessment and Plan: Problem List Items Addressed This Visit      Endocrine   Diabetes mellitus, type 2 ([place])    Other Visit Diagnoses    Pain due to onychomycosis of toenails of both feet    -  Primary   Plantar fasciitis       Achilles tendonitis, bilateral       Pes planus of both feet         -Complete examination performed.  -Re-Discussed with patient in detail the condition of plantar fasciitis with achilles tendonitis and long term care  -Advised patient if pain recurs or worsens should consider EPAT or surgery  -For now, continue with night splint to alternate between left and right foot -Recommend continue with stretching, icing, good supportive shoes, inserts daily and heel lifts; [employer] diabetic shoe order form was completed; office to contact primary care for approval / certification;  Office to arrange shoe fitting and dispensing. -Also this visit debrided nails x 10 using steril nail nipper without incident .  -Patient to return to office for diabetic shoes or sooner if problems or questions arise and 10 weeks for routine nail care.   Landis Martins, DPM

## 2017-07-09 ENCOUNTER — Ambulatory Visit: Payer: Medicare Other | Admitting: Orthotics

## 2017-07-31 ENCOUNTER — Ambulatory Visit (INDEPENDENT_AMBULATORY_CARE_PROVIDER_SITE_OTHER): Payer: Medicare Other | Admitting: Sports Medicine

## 2017-07-31 ENCOUNTER — Encounter: Payer: Self-pay | Admitting: Sports Medicine

## 2017-07-31 DIAGNOSIS — M2141 Flat foot [pes planus] (acquired), right foot: Secondary | ICD-10-CM | POA: Diagnosis not present

## 2017-07-31 DIAGNOSIS — M7661 Achilles tendinitis, right leg: Secondary | ICD-10-CM

## 2017-07-31 DIAGNOSIS — M722 Plantar fascial fibromatosis: Secondary | ICD-10-CM

## 2017-07-31 DIAGNOSIS — M7662 Achilles tendinitis, left leg: Secondary | ICD-10-CM

## 2017-07-31 DIAGNOSIS — E1140 Type 2 diabetes mellitus with diabetic neuropathy, unspecified: Secondary | ICD-10-CM | POA: Diagnosis not present

## 2017-07-31 DIAGNOSIS — M2142 Flat foot [pes planus] (acquired), left foot: Secondary | ICD-10-CM

## 2017-07-31 NOTE — Progress Notes (Signed)
Subjective: Dean Brown is a 72 y.o. diabetic male presents to office to pick up Diabetic shoes. Patient reports that otherwise he is doing good with minimal pain to feet and some ocassional pain to back of left heel. Otherwise no symptoms.  FBS not checked this AM   Patient Active Problem List   Diagnosis Date Noted  . SIRS (systemic inflammatory response syndrome) ([place]) 12/03/2013  . CAP (community acquired pneumonia) 12/03/2013  . Acid reflux 12/03/2013  . BP (high blood pressure) 12/03/2013  . Arthritis, degenerative 12/03/2013  . Diabetes mellitus, type 2 ([place]) 12/03/2013  . HLD (hyperlipidemia) 03/08/2012  . Obstructive apnea 08/04/2011  . ACHILLES TENDINITIS 11/10/2008  . FOOT PAIN, LEFT 11/10/2008  . PES PLANUS 11/10/2008  . HALLUX VALGUS 11/10/2008    Current Outpatient Medications on File Prior to Visit  Medication Sig Dispense Refill  . amLODipine (NORVASC) 10 MG tablet Take 10 mg by mouth daily.    Marland Kitchen aspirin 81 MG chewable tablet Chew 81 mg by mouth daily.    Marland Kitchen atorvastatin (LIPITOR) 80 MG tablet Take 80 mg by mouth daily.    Marland Kitchen azelastine (ASTELIN) 137 MCG/SPRAY nasal spray Place 1 spray into both nostrils 2 (two) times daily as needed for rhinitis. Use in each nostril as directed    . DULoxetine (CYMBALTA) 30 MG capsule TK ONE C PO  BID FOR DEPRESSION AND PAIN    . furosemide (LASIX) 20 MG tablet Take 20 mg by mouth daily as needed for edema.    . gabapentin (NEURONTIN) 300 MG capsule Take by mouth.    Marland Kitchen glucagon (GLUCAGEN) 1 MG SOLR Inject 1 mg into the vein once as needed (severely low blood sugar).    . insulin glargine (LANTUS) 100 UNIT/ML injection Inject 22 Units into the skin at bedtime.    Marland Kitchen lisinopril (PRINIVIL,ZESTRIL) 40 MG tablet Take 40 mg by mouth daily.    . metFORMIN (GLUCOPHAGE-XR) 500 MG 24 hr tablet Take 500 mg by mouth daily with breakfast.    . MODAFINIL PO Take 200 mg by mouth daily.    . naproxen (NAPROSYN) 500 MG tablet Take 500 mg by  mouth 2 (two) times daily as needed.     Marland Kitchen omeprazole (PRILOSEC) 20 MG capsule Take 20 mg by mouth daily.    . SOLIQUA 100-33 UNT-MCG/ML SOPN INJ 23 UNITS [place] ONCE A DAY  6  . traMADol (ULTRAM) 50 MG tablet Take 50 mg by mouth every 6 (six) hours as needed for moderate pain.    Marland Kitchen zolpidem (AMBIEN) 5 MG tablet Take by mouth.     Current Facility-Administered Medications on File Prior to Visit  Medication Dose Route Frequency Provider Last Rate Last Dose  . triamcinolone acetonide (KENALOG) 10 MG/ML injection 10 mg  10 mg Other Once Landis Martins, DPM      . triamcinolone acetonide (KENALOG) 10 MG/ML injection 10 mg  10 mg Other Once Landis Martins, DPM      . triamcinolone acetonide (KENALOG) 10 MG/ML injection 10 mg  10 mg Other Once Landis Martins, DPM        Allergies  Allergen Reactions  . No Known Allergies     Objective:   General:  Alert and oriented x 3, in no acute distress  Dermatology: Skin is warm, dry, and supple bilateral. Nails are mildly thick and short. There is no lower extremity erythema, no eccymosis, no open lesions present bilateral.   Vascular: Dorsalis Pedis and Posterior Tibial pedal pulses  are 1/4 bilateral. + hair growth noted bilateral. Capillary Fill Time is 3 seconds in all digits. No varicosities, No edema bilateral lower extremities.   Neurological: Sensation grossly intact to light touch with an achilles reflex of +2 and a  negative Tinel's sign bilateral. Vibratory, sharp/dull, Semmes Weinstein Monofilament within normal limits.   Musculoskeletal: There is no tenderness to palpation at the medial calcaneal tubercale and through the insertion of the plantar fascia bilateral. Minimal pain to left achilles insertion. No pain with compression to calcaneus or application of tuning fork. There is decreased Ankle joint range of motion bilateral. All other joints range of motion  within normal limits bilateral except mild limitation at 1st MTPJ. Pes planus  bilateral. Minimal pinch callus noted. Strength 5/5 bilateral.   Assessment and Plan: Problem List Items Addressed This Visit      Endocrine   Diabetes mellitus, type 2 ([place])    Other Visit Diagnoses    Plantar fasciitis    -  Primary   Achilles tendonitis, bilateral       Pes planus of both feet         -Complete examination performed.  -Diabetic shoes were dispensed with wear and use explained; Patient desires another pair and will set up an appt for a 2nd pair and will have current pair checked by Liliane Channel at next visit -Patient to return to office for diabetic shoe check.   Landis Martins, DPM

## 2017-08-28 ENCOUNTER — Ambulatory Visit (INDEPENDENT_AMBULATORY_CARE_PROVIDER_SITE_OTHER): Payer: Medicare Other | Admitting: Sports Medicine

## 2017-08-28 ENCOUNTER — Encounter: Payer: Self-pay | Admitting: Sports Medicine

## 2017-08-28 DIAGNOSIS — M79675 Pain in left toe(s): Secondary | ICD-10-CM

## 2017-08-28 DIAGNOSIS — B351 Tinea unguium: Secondary | ICD-10-CM

## 2017-08-28 DIAGNOSIS — M79674 Pain in right toe(s): Secondary | ICD-10-CM

## 2017-08-28 DIAGNOSIS — E1140 Type 2 diabetes mellitus with diabetic neuropathy, unspecified: Secondary | ICD-10-CM | POA: Diagnosis not present

## 2017-08-28 NOTE — Progress Notes (Signed)
Subjective: Dean Brown is a 72 y.o. diabetic male returns to office for follow up evaluation of diabetic shoes and for nail trim.Patient denies any recent changes in medications or new problems since last visit.   FBS not checked this AM   Patient Active Problem List   Diagnosis Date Noted  . SIRS (systemic inflammatory response syndrome) ([place]) 12/03/2013  . CAP (community acquired pneumonia) 12/03/2013  . Acid reflux 12/03/2013  . BP (high blood pressure) 12/03/2013  . Arthritis, degenerative 12/03/2013  . Diabetes mellitus, type 2 ([place]) 12/03/2013  . HLD (hyperlipidemia) 03/08/2012  . Obstructive apnea 08/04/2011  . ACHILLES TENDINITIS 11/10/2008  . FOOT PAIN, LEFT 11/10/2008  . PES PLANUS 11/10/2008  . HALLUX VALGUS 11/10/2008    Current Outpatient Medications on File Prior to Visit  Medication Sig Dispense Refill  . amLODipine (NORVASC) 10 MG tablet Take 10 mg by mouth daily.    Marland Kitchen aspirin 81 MG chewable tablet Chew 81 mg by mouth daily.    Marland Kitchen atorvastatin (LIPITOR) 80 MG tablet Take 80 mg by mouth daily.    Marland Kitchen azelastine (ASTELIN) 137 MCG/SPRAY nasal spray Place 1 spray into both nostrils 2 (two) times daily as needed for rhinitis. Use in each nostril as directed    . DULoxetine (CYMBALTA) 30 MG capsule TK ONE C PO  BID FOR DEPRESSION AND PAIN    . furosemide (LASIX) 20 MG tablet Take 20 mg by mouth daily as needed for edema.    . gabapentin (NEURONTIN) 300 MG capsule Take by mouth.    Marland Kitchen glucagon (GLUCAGEN) 1 MG SOLR Inject 1 mg into the vein once as needed (severely low blood sugar).    . insulin glargine (LANTUS) 100 UNIT/ML injection Inject 22 Units into the skin at bedtime.    Marland Kitchen lisinopril (PRINIVIL,ZESTRIL) 40 MG tablet Take 40 mg by mouth daily.    . metFORMIN (GLUCOPHAGE-XR) 500 MG 24 hr tablet Take 500 mg by mouth daily with breakfast.    . MODAFINIL PO Take 200 mg by mouth daily.    . naproxen (NAPROSYN) 500 MG tablet Take 500 mg by mouth 2 (two) times daily as  needed.     Marland Kitchen omeprazole (PRILOSEC) 20 MG capsule Take 20 mg by mouth daily.    . SOLIQUA 100-33 UNT-MCG/ML SOPN INJ 23 UNITS [place] ONCE A DAY  6  . traMADol (ULTRAM) 50 MG tablet Take 50 mg by mouth every 6 (six) hours as needed for moderate pain.    Marland Kitchen zolpidem (AMBIEN) 5 MG tablet Take by mouth.     Current Facility-Administered Medications on File Prior to Visit  Medication Dose Route Frequency Provider Last Rate Last Dose  . triamcinolone acetonide (KENALOG) 10 MG/ML injection 10 mg  10 mg Other Once Landis Martins, DPM      . triamcinolone acetonide (KENALOG) 10 MG/ML injection 10 mg  10 mg Other Once Landis Martins, DPM      . triamcinolone acetonide (KENALOG) 10 MG/ML injection 10 mg  10 mg Other Once Landis Martins, DPM        Allergies  Allergen Reactions  . No Known Allergies     Objective:   General:  Alert and oriented x 3, in no acute distress  Dermatology: Skin is warm, dry, and supple bilateral. Nails are mildly thick and elonagted. Mild callus right 5th toe and medial left hallux. There is no lower extremity erythema, no eccymosis, no open lesions present bilateral.   Vascular: Dorsalis Pedis and Posterior  Tibial pedal pulses are 1/4 bilateral. + hair growth noted bilateral. Capillary Fill Time is 3 seconds in all digits. No varicosities, No edema bilateral lower extremities.   Neurological: Sensation grossly intact to light touch with an achilles reflex of +2 and a negative Tinel's sign bilateral. Vibratory, sharp/dull, Semmes Weinstein Monofilament within normal limits.   Musculoskeletal: There is no tenderness to palpation at the medial calcaneal tubercale and through the insertion of the plantar fascia bilateral. No pain to achilles insertion R>L with no gap/dell. No pain with compression to calcaneus or application of tuning fork. There is decreased Ankle joint range of motion bilateral. All other joints range of motion  within normal limits bilateral except mild  limitation at 1st MTPJ. Pes planus bilateral. Strength 5/5 bilateral.   Assessment and Plan: Problem List Items Addressed This Visit      Endocrine   Diabetes mellitus, type 2 ([place])    Other Visit Diagnoses    Pain due to onychomycosis of toenails of both feet    -  Primary     -Complete examination performed.  -Recommend continue with stretching, icing, good supportive diabetic shoes --Mechanically debrided nails x 10 using steril nail nipper without incident .  -Patient to return to office for diabetic foot care or sooner if problems or questions arise and 10-12 weeks.   Landis Martins, DPM

## 2017-09-12 DIAGNOSIS — G4700 Insomnia, unspecified: Secondary | ICD-10-CM | POA: Insufficient documentation

## 2017-10-17 ENCOUNTER — Other Ambulatory Visit: Payer: Self-pay | Admitting: Orthopaedic Surgery

## 2017-10-17 DIAGNOSIS — M5136 Other intervertebral disc degeneration, lumbar region: Secondary | ICD-10-CM

## 2017-10-22 ENCOUNTER — Ambulatory Visit
Admission: RE | Admit: 2017-10-22 | Discharge: 2017-10-22 | Disposition: A | Discharge status: Home / Self Care | Payer: Medicare Other | Source: Ambulatory Visit | Attending: Orthopaedic Surgery | Admitting: Orthopaedic Surgery

## 2017-10-22 DIAGNOSIS — M5136 Other intervertebral disc degeneration, lumbar region: Secondary | ICD-10-CM

## 2017-11-13 ENCOUNTER — Emergency Department (HOSPITAL_COMMUNITY): Payer: Medicare Other

## 2017-11-13 ENCOUNTER — Encounter (HOSPITAL_COMMUNITY): Payer: Self-pay | Admitting: Emergency Medicine

## 2017-11-13 ENCOUNTER — Other Ambulatory Visit: Payer: Self-pay

## 2017-11-13 ENCOUNTER — Emergency Department (HOSPITAL_COMMUNITY)
Admission: EM | Admit: 2017-11-13 | Discharge: 2017-11-13 | Disposition: A | Discharge status: Home / Self Care | Payer: Medicare Other | Attending: Emergency Medicine | Admitting: Emergency Medicine

## 2017-11-13 DIAGNOSIS — M25511 Pain in right shoulder: Secondary | ICD-10-CM | POA: Diagnosis not present

## 2017-11-13 DIAGNOSIS — Z79899 Other long term (current) drug therapy: Secondary | ICD-10-CM | POA: Diagnosis not present

## 2017-11-13 DIAGNOSIS — R0781 Pleurodynia: Secondary | ICD-10-CM | POA: Insufficient documentation

## 2017-11-13 DIAGNOSIS — I10 Essential (primary) hypertension: Secondary | ICD-10-CM | POA: Diagnosis not present

## 2017-11-13 DIAGNOSIS — Z87891 Personal history of nicotine dependence: Secondary | ICD-10-CM | POA: Diagnosis not present

## 2017-11-13 DIAGNOSIS — E119 Type 2 diabetes mellitus without complications: Secondary | ICD-10-CM | POA: Insufficient documentation

## 2017-11-13 DIAGNOSIS — Z8546 Personal history of malignant neoplasm of prostate: Secondary | ICD-10-CM | POA: Diagnosis not present

## 2017-11-13 DIAGNOSIS — Z7982 Long term (current) use of aspirin: Secondary | ICD-10-CM | POA: Diagnosis not present

## 2017-11-13 DIAGNOSIS — M542 Cervicalgia: Secondary | ICD-10-CM | POA: Insufficient documentation

## 2017-11-13 DIAGNOSIS — Z794 Long term (current) use of insulin: Secondary | ICD-10-CM | POA: Diagnosis not present

## 2017-11-13 MED ORDER — METHOCARBAMOL 500 MG PO TABS: 500.0000 mg | ORAL_TABLET | Freq: Two times a day (BID) | ORAL | 0 refills | Status: DC

## 2017-11-13 MED ORDER — ACETAMINOPHEN 500 MG PO TABS
1000.0000 mg | ORAL_TABLET | Freq: Once | ORAL | Status: AC
  Administered 2017-11-13: 1000 mg via ORAL
  Filled 2017-11-13: qty 2

## 2017-11-13 NOTE — ED Notes (Signed)
Pt transported to xray 

## 2017-11-13 NOTE — ED Notes (Signed)
Pt back from x-ray.

## 2017-11-13 NOTE — ED Notes (Signed)
RT to come do incentive spirometry education with pt

## 2017-11-13 NOTE — ED Notes (Signed)
Patient able to ambulate independently  

## 2017-11-13 NOTE — Discharge Instructions (Signed)
The pain your experiencing is likely due to muscle strain, you may take tylenol and Robaxin as needed for pain management. Do not combine with any pain reliever other than tylenol. Use incentive spirometer at least 3 times a day to promote deep breaths. The muscle soreness should improve over the next week. Follow up with your family doctor in the next week for a recheck if you are still having symptoms. Return to ED if pain is worsening, you develop weakness or numbness of extremities, or new or concerning symptoms develop.
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
## 2017-12-04 ENCOUNTER — Encounter: Payer: Self-pay | Admitting: Sports Medicine

## 2017-12-04 ENCOUNTER — Ambulatory Visit (INDEPENDENT_AMBULATORY_CARE_PROVIDER_SITE_OTHER): Payer: Medicare Other | Admitting: Sports Medicine

## 2017-12-04 DIAGNOSIS — M79675 Pain in left toe(s): Secondary | ICD-10-CM | POA: Diagnosis not present

## 2017-12-04 DIAGNOSIS — B351 Tinea unguium: Secondary | ICD-10-CM

## 2017-12-04 DIAGNOSIS — Z8546 Personal history of malignant neoplasm of prostate: Secondary | ICD-10-CM | POA: Insufficient documentation

## 2017-12-04 DIAGNOSIS — M79674 Pain in right toe(s): Secondary | ICD-10-CM | POA: Diagnosis not present

## 2017-12-04 DIAGNOSIS — E1140 Type 2 diabetes mellitus with diabetic neuropathy, unspecified: Secondary | ICD-10-CM | POA: Diagnosis not present

## 2017-12-04 NOTE — Progress Notes (Signed)
Subjective: Dean Brown is a 72 y.o. diabetic male returns to office for diabetic foot check and nail trim.Patient denies any recent changes in medications or new problems since last visit.   Reports that he will have his back looked at and get another steroid injection this week.   FBS not checked this AM and last A1c around 6 something per patient.  Patient Active Problem List   Diagnosis Date Noted  . SIRS (systemic inflammatory response syndrome) ([place]) 12/03/2013  . CAP (community acquired pneumonia) 12/03/2013  . Acid reflux 12/03/2013  . BP (high blood pressure) 12/03/2013  . Arthritis, degenerative 12/03/2013  . Diabetes mellitus, type 2 ([place]) 12/03/2013  . HLD (hyperlipidemia) 03/08/2012  . Obstructive apnea 08/04/2011  . ACHILLES TENDINITIS 11/10/2008  . FOOT PAIN, LEFT 11/10/2008  . PES PLANUS 11/10/2008  . HALLUX VALGUS 11/10/2008    Current Outpatient Medications on File Prior to Visit  Medication Sig Dispense Refill  . amLODipine (NORVASC) 10 MG tablet Take 10 mg by mouth daily.    Marland Kitchen aspirin 81 MG chewable tablet Chew 81 mg by mouth daily.    Marland Kitchen atorvastatin (LIPITOR) 80 MG tablet Take 80 mg by mouth daily.    Marland Kitchen azelastine (ASTELIN) 137 MCG/SPRAY nasal spray Place 1 spray into both nostrils 2 (two) times daily as needed for rhinitis. Use in each nostril as directed    . DULoxetine (CYMBALTA) 30 MG capsule TK ONE C PO  BID FOR DEPRESSION AND PAIN    . furosemide (LASIX) 20 MG tablet Take 20 mg by mouth daily as needed for edema.    . gabapentin (NEURONTIN) 300 MG capsule Take by mouth.    Marland Kitchen glucagon (GLUCAGEN) 1 MG SOLR Inject 1 mg into the vein once as needed (severely low blood sugar).    . insulin glargine (LANTUS) 100 UNIT/ML injection Inject 22 Units into the skin at bedtime.    Marland Kitchen lisinopril (PRINIVIL,ZESTRIL) 40 MG tablet Take 40 mg by mouth daily.    . metFORMIN (GLUCOPHAGE-XR) 500 MG 24 hr tablet Take 500 mg by mouth daily with breakfast.    .  methocarbamol (ROBAXIN) 500 MG tablet Take 1 tablet (500 mg total) by mouth 2 (two) times daily. 20 tablet 0  . MODAFINIL PO Take 200 mg by mouth daily.    . naproxen (NAPROSYN) 500 MG tablet Take 500 mg by mouth 2 (two) times daily as needed.     Marland Kitchen omeprazole (PRILOSEC) 20 MG capsule Take 20 mg by mouth daily.    . SOLIQUA 100-33 UNT-MCG/ML SOPN INJ 23 UNITS [place] ONCE A DAY  6  . traMADol (ULTRAM) 50 MG tablet Take 50 mg by mouth every 6 (six) hours as needed for moderate pain.    Marland Kitchen zolpidem (AMBIEN) 5 MG tablet Take by mouth.     Current Facility-Administered Medications on File Prior to Visit  Medication Dose Route Frequency Provider Last Rate Last Dose  . triamcinolone acetonide (KENALOG) 10 MG/ML injection 10 mg  10 mg Other Once Landis Martins, DPM      . triamcinolone acetonide (KENALOG) 10 MG/ML injection 10 mg  10 mg Other Once Landis Martins, DPM      . triamcinolone acetonide (KENALOG) 10 MG/ML injection 10 mg  10 mg Other Once Landis Martins, DPM        Allergies  Allergen Reactions  . No Known Allergies     Objective:   General:  Alert and oriented x 3, in no acute distress  Dermatology: Skin is warm, dry, and supple bilateral. Nails are mildly thick and elonagted. Minimal callus medial left and right hallux & right 5th toe. There is no lower extremity erythema, no eccymosis, no open lesions present bilateral.   Vascular: Dorsalis Pedis and Posterior Tibial pedal pulses are 1/4 bilateral. + hair growth noted bilateral. Capillary Fill Time is 3 seconds in all digits. No varicosities, No edema bilateral lower extremities.   Neurological: Sensation grossly intact to light touch with an achilles reflex of +2 and a negative Tinel's sign bilateral. Vibratory, sharp/dull, Semmes Weinstein Monofilament within normal limits.   Musculoskeletal: There is no tenderness to palpation bilateral. No pain with compression to calcaneus or application of tuning fork. There is decreased Ankle  joint range of motion bilateral. All other joints range of motion  within normal limits bilateral except mild limitation at 1st MTPJ. Pes planus bilateral. Strength 5/5 bilateral.   Assessment and Plan: Problem List Items Addressed This Visit      Endocrine   Diabetes mellitus, type 2 ([place])    Other Visit Diagnoses    Pain due to onychomycosis of toenails of both feet    -  Primary     -Complete examination performed.  -Mechanically debrided nails x 10 using steril nail nipper without incident.  -At no charge debrided callus areas using chisel blade without incident -Continue with diabetic shoes  -Patient to return to office for diabetic foot care or sooner if problems or questions arise and 10-12 weeks.   Landis Martins, DPM

## 2018-03-05 ENCOUNTER — Ambulatory Visit: Payer: Medicare Other | Admitting: Sports Medicine

## 2018-03-16 ENCOUNTER — Emergency Department (HOSPITAL_COMMUNITY)
Admission: EM | Admit: 2018-03-16 | Discharge: 2018-03-16 | Disposition: A | Discharge status: Home / Self Care | Payer: Medicare Other | Attending: Emergency Medicine | Admitting: Emergency Medicine

## 2018-03-16 DIAGNOSIS — Y939 Activity, unspecified: Secondary | ICD-10-CM | POA: Insufficient documentation

## 2018-03-16 DIAGNOSIS — T161XXA Foreign body in right ear, initial encounter: Secondary | ICD-10-CM | POA: Insufficient documentation

## 2018-03-16 DIAGNOSIS — Z8546 Personal history of malignant neoplasm of prostate: Secondary | ICD-10-CM | POA: Diagnosis not present

## 2018-03-16 DIAGNOSIS — Y929 Unspecified place or not applicable: Secondary | ICD-10-CM | POA: Insufficient documentation

## 2018-03-16 DIAGNOSIS — E119 Type 2 diabetes mellitus without complications: Secondary | ICD-10-CM | POA: Diagnosis not present

## 2018-03-16 DIAGNOSIS — Y999 Unspecified external cause status: Secondary | ICD-10-CM | POA: Insufficient documentation

## 2018-03-16 DIAGNOSIS — T162XXA Foreign body in left ear, initial encounter: Secondary | ICD-10-CM | POA: Insufficient documentation

## 2018-03-16 DIAGNOSIS — X58XXXA Exposure to other specified factors, initial encounter: Secondary | ICD-10-CM | POA: Diagnosis not present

## 2018-03-16 DIAGNOSIS — I10 Essential (primary) hypertension: Secondary | ICD-10-CM | POA: Insufficient documentation

## 2018-03-16 DIAGNOSIS — T169XXA Foreign body in ear, unspecified ear, initial encounter: Secondary | ICD-10-CM

## 2018-03-16 NOTE — ED Triage Notes (Signed)
Pt from home with c/o bilateral ear pain. Pt states the ear pieces from his hearing aids are stuck. Pt states he does not know how long they have been in there, states he just noticed last night.

## 2018-03-16 NOTE — ED Provider Notes (Signed)
[facility] EMERGENCY DEPARTMENT Provider Note   CSN: [id number] Arrival date & time: 03/16/18  1237     History   Chief Complaint Chief Complaint  Patient presents with  . Otalgia    HPI Dean Brown is a 72 y.o. male.  HPI   Presents with concern for bilateral ear foreign bodies. Using hearing aids, had to switch to smaller ear buds approximately 2 weeks ago, and reports that they will often fall off.  Reports at one point he thought he lost the smaller earbuds, but last night he was putting a Q-tip in his ear, and felt like there was an obstruction there, and was having bilateral ear itching, and realized that he likely had earbuds in both ears.  Denies pain, other concerns.   Past Medical History:  Diagnosis Date  . Arthritis   . Cancer [facility])    Prostate cancer  . Diabetes mellitus    type 2  . GERD (gastroesophageal reflux disease)   . Hypertension   . Pneumonia 12/03/2013  . Sleep apnea    cpap sleep study - [facility]    Patient Active Problem List   Diagnosis Date Noted  . History of prostate cancer 12/04/2017  . Insomnia 09/12/2017  . ED (erectile dysfunction) 01/23/2017  . 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 Gross hematuria 11/16/2014  . 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 SIRS (systemic inflammatory response syndrome) ([place]) 12/03/2013  . CAP (community acquired pneumonia) 12/03/2013  . Acid reflux 12/03/2013  . BP (high blood pressure) 12/03/2013  . Arthritis, degenerative 12/03/2013  . Diabetes mellitus, type 2 ([place]) 12/03/2013  . History of penile implant 06/16/2013  . HLD (hyperlipidemia) 03/08/2012  . Obstructive apnea 08/04/2011  . 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 ACHILLES TENDINITIS 11/10/2008  . FOOT PAIN, LEFT 11/10/2008  . PES PLANUS 11/10/2008  . HALLUX VALGUS 11/10/2008    Past Surgical History:  Procedure Laterality Date  . KNEE SURGERY Right 10/2012  .  LUMBAR LAMINECTOMY/DECOMPRESSION MICRODISCECTOMY  11/01/2011   Procedure: LUMBAR LAMINECTOMY/DECOMPRESSION MICRODISCECTOMY;  Surgeon: Sinclair Ship, MD;  Location: [facility];  Service: Orthopedics;  Laterality: Left;  Left sided lumbar 5- sacrum 1 microdisectomy  . microdiskectomy  11/01/2011  . PROSTATE SURGERY     s/p cancer  . ROTATOR CUFF REPAIR     left shoulder        Home Medications    Prior to Admission medications   Medication Sig Start Date End Date Taking? Authorizing Provider  amLODipine (NORVASC) 10 MG tablet Take 10 mg by mouth daily.    [provider]  aspirin 81 MG chewable tablet Chew 81 mg by mouth daily.    [provider]  atorvastatin (LIPITOR) 80 MG tablet Take 80 mg by mouth daily.    [provider]  azelastine (ASTELIN) 137 MCG/SPRAY nasal spray Place 1 spray into both nostrils 2 (two) times daily as needed for rhinitis. Use in each nostril as directed    [provider]  DULoxetine (CYMBALTA) 30 MG capsule TK ONE C PO  BID FOR DEPRESSION AND PAIN 02/21/16   [provider]  furosemide (LASIX) 20 MG tablet Take 20 mg by mouth daily as needed for edema.    [provider]  gabapentin (NEURONTIN) 300 MG capsule Take by mouth. 01/19/16   [provider]  glucagon (GLUCAGEN) 1 MG SOLR Inject 1 mg into the vein once as needed (severely low  blood sugar).    [provider]  insulin glargine (LANTUS) 100 UNIT/ML injection Inject 22 Units into the skin at bedtime.    [provider]  lisinopril (PRINIVIL,ZESTRIL) 40 MG tablet Take 40 mg by mouth daily.    [provider]  metFORMIN (GLUCOPHAGE-XR) 500 MG 24 hr tablet Take 500 mg by mouth daily with breakfast.    [provider]  methocarbamol (ROBAXIN) 500 MG tablet Take 1 tablet (500 mg total) by mouth 2 (two) times daily. 11/13/17   Jacqlyn Larsen, PA-C  MODAFINIL PO Take 200 mg by mouth daily.    [provider]  naproxen (NAPROSYN) 500 MG tablet Take 500 mg by mouth 2 (two) times daily as needed.     [provider]  omeprazole (PRILOSEC) 20 MG capsule Take 20 mg by mouth daily.    [provider]  SOLIQUA 100-33 UNT-MCG/ML SOPN INJ 23 UNITS [place] ONCE A DAY 08/28/16   [provider]  traMADol (ULTRAM) 50 MG tablet Take 50 mg by mouth every 6 (six) hours as needed for moderate pain.    [provider]  traZODone (DESYREL) 50 MG tablet Take by mouth. 09/12/17 09/12/18  [provider]  zolpidem (AMBIEN) 5 MG tablet Take by mouth.    [provider]    Family History No family history on file.  Social History Social History   Tobacco Use  . Smoking status: Former Smoker    Last attempt to quit: 05/04/1993    Years since quitting: 24.8  . Smokeless tobacco: Never Used  Substance Use Topics  . Alcohol use: Yes    Comment: ocassional  . Drug use: No     Allergies   No known allergies   Review of Systems Review of Systems  HENT: Negative for ear discharge and ear pain (ear itching, foreign body sensation).      Physical Exam Updated Vital Signs BP (!) 152/135 (BP Location: Right Wrist)   Pulse 76   Temp 98.1 F (36.7 C) (Oral)   Resp 14   Ht 5\' 5"  (1.651 m)   Wt 77.1 kg   SpO2 99%   BMI 28.29 kg/m   Physical Exam  Constitutional: He is oriented to person, place, and time. He appears well-developed and well-nourished. No distress.  HENT:  Head: Normocephalic and atraumatic.  Clear ear buds present bilateral ear canals  Eyes: Conjunctivae and EOM are normal.  Neck: Normal range of motion.  Cardiovascular: Normal rate, regular rhythm and intact distal pulses.  Pulmonary/Chest: Effort normal. No respiratory distress.  Neurological: He is alert and oriented to person, place, and time.  Skin: Skin is warm and dry. He is not diaphoretic.  Nursing note and vitals reviewed.    ED Treatments / Results  Labs (all labs  ordered are listed, but only abnormal results are displayed) Labs Reviewed - No data to display  EKG None  Radiology No results found.  Procedures .Foreign Body Removal Date/Time: 03/16/2018 12:53 PM Performed by: Gareth Morgan, MD Authorized by: Gareth Morgan, MD  Consent: Verbal consent obtained. Risks and benefits: risks, benefits and alternatives were discussed Consent given by: patient Required items: required blood products, implants, devices, and special equipment available Time out: Immediately prior to procedure a "time out" was called to verify the correct patient, procedure, equipment, support staff and site/side marked as required. Body area: ear Location details: right ear  Sedation: Patient sedated: no  Patient restrained: no Localization method: magnification  Removal mechanism: forceps Complexity: simple 1 objects recovered. Objects recovered: hearing aid ear bud Post-procedure assessment: foreign body removed Patient tolerance: Patient tolerated the procedure well with no immediate complications .Foreign Body Removal Date/Time: 03/16/2018 1:00 PM Performed by: Gareth Morgan, MD Authorized by: Gareth Morgan, MD  Consent: Verbal consent obtained. Risks and benefits: risks, benefits and alternatives were discussed Consent given by: patient Required items: required blood products, implants, devices, and special equipment available Patient identity confirmed: verbally with patient Time out: Immediately prior to procedure a "time out" was called to verify the correct patient, procedure, equipment, support staff and site/side marked as required. Body area: ear Location details: left ear Localization method: visualized Removal mechanism: forceps Complexity: simple 1 objects recovered. Objects recovered: hearing aid ear bud Post-procedure assessment: foreign body removed Patient tolerance: Patient tolerated the procedure well with no immediate  complications   (including critical care time)  Medications Ordered in ED Medications - No data to display   Initial Impression / Assessment and Plan / ED Course  I have reviewed the triage vital signs and the nursing notes.  Pertinent labs & imaging results that were available during my care of the patient were reviewed by me and considered in my medical decision making (see chart for details).     72yo male presents with concern for bilateral ear foreign bodies, with hearing aid beds present in both ears.  Foreign bodies visualized, and removed with forceps both right and left ears.  TM visualized following procedure without any abnormalities, no existing foreign body.  Recommend patient use different type of hearing aid bud, and discussed not placing other objects including qtips into his ears.Patient discharged in stable condition with understanding of reasons to return.  Final Clinical Impressions(s) / ED Diagnoses   Final diagnoses:  Foreign body in ear, unspecified laterality, initial encounter    ED Discharge Orders    None       Gareth Morgan, MD 03/16/18 1303

## 2018-04-02 ENCOUNTER — Ambulatory Visit (INDEPENDENT_AMBULATORY_CARE_PROVIDER_SITE_OTHER): Payer: Medicare Other | Admitting: Sports Medicine

## 2018-04-02 ENCOUNTER — Encounter: Payer: Self-pay | Admitting: Sports Medicine

## 2018-04-02 DIAGNOSIS — M79674 Pain in right toe(s): Secondary | ICD-10-CM

## 2018-04-02 DIAGNOSIS — M79675 Pain in left toe(s): Secondary | ICD-10-CM

## 2018-04-02 DIAGNOSIS — B351 Tinea unguium: Secondary | ICD-10-CM | POA: Diagnosis not present

## 2018-04-02 DIAGNOSIS — M722 Plantar fascial fibromatosis: Secondary | ICD-10-CM

## 2018-04-02 DIAGNOSIS — E1140 Type 2 diabetes mellitus with diabetic neuropathy, unspecified: Secondary | ICD-10-CM | POA: Diagnosis not present

## 2018-04-02 NOTE — Progress Notes (Signed)
Subjective: Dean Brown is a 72 y.o. diabetic male returns to office for diabetic foot check and nail trim.Patient denies any recent changes in medications or new problems since last visit. States that heel R>L flares up especially when he hasn't worn his diabetic shoes. Reports back is doing worse and is going to get another steroid injection.   FBS not checked this AM and last A1c around 6 something per patient.  Patient Active Problem List   Diagnosis Date Noted  . History of prostate cancer 12/04/2017  . Insomnia 09/12/2017  . ED (erectile dysfunction) 01/23/2017  . 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 Gross hematuria 11/16/2014  . 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 SIRS (systemic inflammatory response syndrome) ([place]) 12/03/2013  . CAP (community acquired pneumonia) 12/03/2013  . Acid reflux 12/03/2013  . BP (high blood pressure) 12/03/2013  . Arthritis, degenerative 12/03/2013  . Diabetes mellitus, type 2 ([place]) 12/03/2013  . History of penile implant 06/16/2013  . HLD (hyperlipidemia) 03/08/2012  . Obstructive apnea 08/04/2011  . 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 ACHILLES TENDINITIS 11/10/2008  . FOOT PAIN, LEFT 11/10/2008  . PES PLANUS 11/10/2008  . HALLUX VALGUS 11/10/2008    Current Outpatient Medications on File Prior to Visit  Medication Sig Dispense Refill  . amLODipine (NORVASC) 10 MG tablet Take 10 mg by mouth daily.    Marland Kitchen aspirin 81 MG chewable tablet Chew 81 mg by mouth daily.    Marland Kitchen atorvastatin (LIPITOR) 80 MG tablet Take 80 mg by mouth daily.    Marland Kitchen azelastine (ASTELIN) 137 MCG/SPRAY nasal spray Place 1 spray into both nostrils 2 (two) times daily as needed for rhinitis. Use in each nostril as directed    . DULoxetine (CYMBALTA) 30 MG capsule TK ONE C PO  BID FOR DEPRESSION AND PAIN    . furosemide (LASIX) 20 MG tablet Take 20 mg by mouth daily as needed for edema.    . gabapentin (NEURONTIN) 300 MG  capsule Take by mouth.    Marland Kitchen glucagon (GLUCAGEN) 1 MG SOLR Inject 1 mg into the vein once as needed (severely low blood sugar).    . insulin glargine (LANTUS) 100 UNIT/ML injection Inject 22 Units into the skin at bedtime.    Marland Kitchen lisinopril (PRINIVIL,ZESTRIL) 40 MG tablet Take 40 mg by mouth daily.    . metFORMIN (GLUCOPHAGE-XR) 500 MG 24 hr tablet Take 500 mg by mouth daily with breakfast.    . methocarbamol (ROBAXIN) 500 MG tablet Take 1 tablet (500 mg total) by mouth 2 (two) times daily. 20 tablet 0  . MODAFINIL PO Take 200 mg by mouth daily.    . naproxen (NAPROSYN) 500 MG tablet Take 500 mg by mouth 2 (two) times daily as needed.     Marland Kitchen omeprazole (PRILOSEC) 20 MG capsule Take 20 mg by mouth daily.    . SOLIQUA 100-33 UNT-MCG/ML SOPN INJ 23 UNITS [place] ONCE A DAY  6  . traMADol (ULTRAM) 50 MG tablet Take 50 mg by mouth every 6 (six) hours as needed for moderate pain.    . traZODone (DESYREL) 50 MG tablet Take by mouth.    . zolpidem (AMBIEN) 5 MG tablet Take by mouth.     Current Facility-Administered Medications on File Prior to Visit  Medication Dose Route Frequency Provider Last Rate Last Dose  . triamcinolone acetonide (KENALOG) 10 MG/ML injection 10 mg  10 mg Other Once Landis Martins, DPM      .  triamcinolone acetonide (KENALOG) 10 MG/ML injection 10 mg  10 mg Other Once Landis Martins, DPM      . triamcinolone acetonide (KENALOG) 10 MG/ML injection 10 mg  10 mg Other Once Landis Martins, DPM        Allergies  Allergen Reactions  . No Known Allergies     Objective:   General:  Alert and oriented x 3, in no acute distress  Dermatology: Skin is warm, dry, and supple bilateral. Nails are mildly thick and elonagted. Minimal callus medial left and right hallux & right 5th toe. There is no lower extremity erythema, no eccymosis, no open lesions present bilateral.   Vascular: Dorsalis Pedis and Posterior Tibial pedal pulses are 1/4 bilateral. + hair growth noted bilateral. Capillary  Fill Time is 3 seconds in all digits. No varicosities, No edema bilateral lower extremities.   Neurological: Sensation grossly intact to light touch with an achilles reflex of +2 and a negative Tinel's sign bilateral. Vibratory, sharp/dull, Semmes Weinstein Monofilament within normal limits.   Musculoskeletal: There is minimal pain to plantar fascia bilateral. No pain with compression to calcaneus or application of tuning fork. There is decreased Ankle joint range of motion bilateral. All other joints range of motion  within normal limits bilateral except mild limitation at 1st MTPJ. Pes planus bilateral. Strength 5/5 bilateral.   Assessment and Plan: Problem List Items Addressed This Visit      Endocrine   Diabetes mellitus, type 2 ([place])    Other Visit Diagnoses    Pain due to onychomycosis of toenails of both feet    -  Primary   Plantar fasciitis         -Complete examination performed.  -Mechanically debrided nails x 10 using steril nail nipper without incident.  -At no charge debrided callus areas using chisel blade without incident -Continue with diabetic shoes and recommend ROM exercises and icing to help with heel pain that flares from time to time; patient decline steroid injection  -Patient to return to office for diabetic foot care or sooner if problems or questions arise and 10-12 weeks.   Landis Martins, DPM

## 2018-06-04 ENCOUNTER — Encounter: Payer: Self-pay | Admitting: Sports Medicine

## 2018-06-04 ENCOUNTER — Ambulatory Visit (INDEPENDENT_AMBULATORY_CARE_PROVIDER_SITE_OTHER): Payer: Medicare Other | Admitting: Sports Medicine

## 2018-06-04 ENCOUNTER — Ambulatory Visit (INDEPENDENT_AMBULATORY_CARE_PROVIDER_SITE_OTHER): Payer: Medicare Other

## 2018-06-04 ENCOUNTER — Other Ambulatory Visit: Payer: Self-pay | Admitting: Sports Medicine

## 2018-06-04 DIAGNOSIS — M79671 Pain in right foot: Secondary | ICD-10-CM

## 2018-06-04 DIAGNOSIS — M79672 Pain in left foot: Secondary | ICD-10-CM

## 2018-06-04 DIAGNOSIS — M722 Plantar fascial fibromatosis: Secondary | ICD-10-CM

## 2018-06-04 DIAGNOSIS — M779 Enthesopathy, unspecified: Secondary | ICD-10-CM

## 2018-06-04 DIAGNOSIS — M7661 Achilles tendinitis, right leg: Secondary | ICD-10-CM | POA: Diagnosis not present

## 2018-06-04 DIAGNOSIS — M2141 Flat foot [pes planus] (acquired), right foot: Secondary | ICD-10-CM

## 2018-06-04 DIAGNOSIS — M2142 Flat foot [pes planus] (acquired), left foot: Secondary | ICD-10-CM

## 2018-06-04 DIAGNOSIS — M7662 Achilles tendinitis, left leg: Secondary | ICD-10-CM

## 2018-06-04 NOTE — Progress Notes (Signed)
Subjective: Dean Brown is a 73 y.o. diabetic male returns to office for bilateral foot pain. Flares have happened. States that heel R>L flares up especially when he hasn't worn his diabetic shoes like before and some pain to achilles. Patient has been busy working and reports back is doing worse and had another injection in back yesterday.   FBS not checked this AM and last A1c around 6 something per patient like before.  Patient Active Problem List   Diagnosis Date Noted  . History of prostate cancer 12/04/2017  . Insomnia 09/12/2017  . ED (erectile dysfunction) 01/23/2017  . 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 Gross hematuria 11/16/2014  . 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 SIRS (systemic inflammatory response syndrome) ([place]) 12/03/2013  . CAP (community acquired pneumonia) 12/03/2013  . Acid reflux 12/03/2013  . BP (high blood pressure) 12/03/2013  . Arthritis, degenerative 12/03/2013  . Diabetes mellitus, type 2 ([place]) 12/03/2013  . History of penile implant 06/16/2013  . HLD (hyperlipidemia) 03/08/2012  . Obstructive apnea 08/04/2011  . 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 ACHILLES TENDINITIS 11/10/2008  . FOOT PAIN, LEFT 11/10/2008  . PES PLANUS 11/10/2008  . HALLUX VALGUS 11/10/2008    Current Outpatient Medications on File Prior to Visit  Medication Sig Dispense Refill  . amLODipine (NORVASC) 10 MG tablet Take 10 mg by mouth daily.    Marland Kitchen aspirin 81 MG chewable tablet Chew 81 mg by mouth daily.    Marland Kitchen atorvastatin (LIPITOR) 80 MG tablet Take 80 mg by mouth daily.    Marland Kitchen azelastine (ASTELIN) 137 MCG/SPRAY nasal spray Place 1 spray into both nostrils 2 (two) times daily as needed for rhinitis. Use in each nostril as directed    . DULoxetine (CYMBALTA) 30 MG capsule TK ONE C PO  BID FOR DEPRESSION AND PAIN    . furosemide (LASIX) 20 MG tablet Take 20 mg by mouth daily as needed for edema.    . gabapentin (NEURONTIN)  300 MG capsule Take by mouth.    Marland Kitchen glucagon (GLUCAGEN) 1 MG SOLR Inject 1 mg into the vein once as needed (severely low blood sugar).    . insulin glargine (LANTUS) 100 UNIT/ML injection Inject 22 Units into the skin at bedtime.    Marland Kitchen lisinopril (PRINIVIL,ZESTRIL) 40 MG tablet Take 40 mg by mouth daily.    . metFORMIN (GLUCOPHAGE-XR) 500 MG 24 hr tablet Take 500 mg by mouth daily with breakfast.    . methocarbamol (ROBAXIN) 500 MG tablet Take 1 tablet (500 mg total) by mouth 2 (two) times daily. 20 tablet 0  . MODAFINIL PO Take 200 mg by mouth daily.    . naproxen (NAPROSYN) 500 MG tablet Take 500 mg by mouth 2 (two) times daily as needed.     Marland Kitchen omeprazole (PRILOSEC) 20 MG capsule Take 20 mg by mouth daily.    . SOLIQUA 100-33 UNT-MCG/ML SOPN INJ 23 UNITS [place] ONCE A DAY  6  . traMADol (ULTRAM) 50 MG tablet Take 50 mg by mouth every 6 (six) hours as needed for moderate pain.    . traZODone (DESYREL) 50 MG tablet Take by mouth.    . zolpidem (AMBIEN) 5 MG tablet Take by mouth.     Current Facility-Administered Medications on File Prior to Visit  Medication Dose Route Frequency Provider Last Rate Last Dose  . triamcinolone acetonide (KENALOG) 10 MG/ML injection 10 mg  10 mg Other Once Landis Martins, DPM      .  triamcinolone acetonide (KENALOG) 10 MG/ML injection 10 mg  10 mg Other Once Landis Martins, DPM      . triamcinolone acetonide (KENALOG) 10 MG/ML injection 10 mg  10 mg Other Once Landis Martins, DPM        Allergies  Allergen Reactions  . No Known Allergies     Objective:   General:  Alert and oriented x 3, in no acute distress  Dermatology: Skin is warm, dry, and supple bilateral. Nails are mildly thick and short. Minimal callus medial left and right hallux & right 5th toe. There is no lower extremity erythema, no eccymosis, no open lesions present bilateral.   Vascular: Dorsalis Pedis and Posterior Tibial pedal pulses are 1/4 bilateral. + hair growth noted bilateral.  Capillary Fill Time is 3 seconds in all digits. No varicosities, No edema bilateral lower extremities.   Neurological: Sensation grossly intact to light touch with an achilles reflex of +2 and a negative Tinel's sign bilateral. Vibratory, sharp/dull, Semmes Weinstein Monofilament within normal limits.   Musculoskeletal: There is minimal pain to plantar fascia bilateral L>R and achilles R>L. No pain with compression to calcaneus or application of tuning fork. There is decreased Ankle joint range of motion bilateral. All other joints range of motion  within normal limits bilateral except mild limitation at 1st MTPJ. Pes planus bilateral. Strength 5/5 bilateral.   Assessment and Plan: Problem List Items Addressed This Visit      Other   FOOT PAIN, LEFT - Primary   Relevant Orders   DG Foot Complete Left (Completed)    Other Visit Diagnoses    Achilles tendonitis, bilateral       Pes planus of both feet       Plantar fasciitis         -Complete examination performed.  -Xrays reviewed no changes from prior -Patient declined injection -Dispensed heel lifts -Continue with diabetic shoes and recommend ROM exercises and icing to help with heel pain that flares from time to time -Dawn to check coverage for custom orthotics  -Patient to return to office for diabetic foot care as scheduled   Landis Martins, DPM

## 2018-06-21 DIAGNOSIS — M75101 Unspecified rotator cuff tear or rupture of right shoulder, not specified as traumatic: Secondary | ICD-10-CM | POA: Insufficient documentation

## 2018-07-02 ENCOUNTER — Other Ambulatory Visit: Payer: Self-pay

## 2018-07-02 ENCOUNTER — Ambulatory Visit (INDEPENDENT_AMBULATORY_CARE_PROVIDER_SITE_OTHER): Payer: Medicare Other | Admitting: Podiatry

## 2018-07-02 DIAGNOSIS — M79675 Pain in left toe(s): Secondary | ICD-10-CM

## 2018-07-02 DIAGNOSIS — Z794 Long term (current) use of insulin: Secondary | ICD-10-CM

## 2018-07-02 DIAGNOSIS — M79674 Pain in right toe(s): Secondary | ICD-10-CM | POA: Diagnosis not present

## 2018-07-02 DIAGNOSIS — L84 Corns and callosities: Secondary | ICD-10-CM

## 2018-07-02 DIAGNOSIS — B351 Tinea unguium: Secondary | ICD-10-CM | POA: Diagnosis not present

## 2018-07-02 DIAGNOSIS — E119 Type 2 diabetes mellitus without complications: Secondary | ICD-10-CM

## 2018-07-02 NOTE — Patient Instructions (Addendum)
Corns and Calluses Corns are small areas of thickened skin that occur on the top, sides, or tip of a toe. They contain a cone-shaped core with a point that can press on a nerve below. This causes pain.  Calluses are areas of thickened skin that can occur anywhere on the body, including the hands, fingers, palms, soles of the feet, and heels. Calluses are usually larger than corns. What are the causes? Corns and calluses are caused by rubbing (friction) or pressure, such as from shoes that are too tight or do not fit properly. What increases the risk? Corns are more likely to develop in people who have misshapen toes (toe deformities), such as hammer toes. Calluses can occur with friction to any area of the skin. They are more likely to develop in people who:  Work with their hands.  Wear shoes that fit poorly, are too tight, or are high-heeled.  Have toe deformities. What are the signs or symptoms? Symptoms of a corn or callus include:  A hard growth on the skin.  Pain or tenderness under the skin.  Redness and swelling.  Increased discomfort while wearing tight-fitting shoes, if your feet are affected. If a corn or callus becomes infected, symptoms may include:  Redness and swelling that gets worse.  Pain.  Fluid, blood, or pus draining from the corn or callus. How is this diagnosed? Corns and calluses may be diagnosed based on your symptoms, your medical history, and a physical exam. How is this treated? Treatment for corns and calluses may include:  Removing the cause of the friction or pressure. This may involve: ? Changing your shoes. ? Wearing shoe inserts (orthotics) or other protective layers in your shoes, such as a corn pad. ? Wearing gloves.  Applying medicine to the skin (topical medicine) to help soften skin in the hardened, thickened areas.  Removing layers of dead skin with a file to reduce the size of the corn or callus.  Removing the corn or callus with a  scalpel or laser.  Taking antibiotic medicines, if your corn or callus is infected.  Having surgery, if a toe deformity is the cause. Follow these instructions at home:   Take over-the-counter and prescription medicines only as told by your health care provider.  If you were prescribed an antibiotic, take it as told by your health care provider. Do not stop taking it even if your condition starts to improve.  Wear shoes that fit well. Avoid wearing high-heeled shoes and shoes that are too tight or too loose.  Wear any padding, protective layers, gloves, or orthotics as told by your health care provider.  Soak your hands or feet and then use a file or pumice stone to soften your corn or callus. Do this as told by your health care provider.  Check your corn or callus every day for symptoms of infection. Contact a health care provider if you:  Notice that your symptoms do not improve with treatment.  Have redness or swelling that gets worse.  Notice that your corn or callus becomes painful.  Have fluid, blood, or pus coming from your corn or callus.  Have new symptoms. Summary  Corns are small areas of thickened skin that occur on the top, sides, or tip of a toe.  Calluses are areas of thickened skin that can occur anywhere on the body, including the hands, fingers, palms, and soles of the feet. Calluses are usually larger than corns.  Corns and calluses are caused by   rubbing (friction) or pressure, such as from shoes that are too tight or do not fit properly.  Treatment may include wearing any padding, protective layers, gloves, or orthotics as told by your health care provider. This information is not intended to replace advice given to you by your health care provider. Make sure you discuss any questions you have with your health care provider. Document Released: 01/15/2004 Document Revised: 02/21/2017 Document Reviewed: 02/21/2017 Elsevier Interactive Patient Education   2019 [employer]  Diabetes Mellitus and Foot Care Foot care is an important part of your health, especially when you have diabetes. Diabetes may cause you to have problems because of poor blood flow (circulation) to your feet and legs, which can cause your skin to:  Become thinner and drier.  Break more easily.  Heal more slowly.  Peel and crack. You may also have nerve damage (neuropathy) in your legs and feet, causing decreased feeling in them. This means that you may not notice minor injuries to your feet that could lead to more serious problems. Noticing and addressing any potential problems early is the best way to prevent future foot problems. How to care for your feet Foot hygiene  Wash your feet daily with warm water and mild soap. Do not use hot water. Then, pat your feet and the areas between your toes until they are completely dry. Do not soak your feet as this can dry your skin.  Trim your toenails straight across. Do not dig under them or around the cuticle. File the edges of your nails with an emery board or nail file.  Apply a moisturizing lotion or petroleum jelly to the skin on your feet and to dry, brittle toenails. Use lotion that does not contain alcohol and is unscented. Do not apply lotion between your toes. Shoes and socks  Wear clean socks or stockings every day. Make sure they are not too tight. Do not wear knee-high stockings since they may decrease blood flow to your legs.  Wear shoes that fit properly and have enough cushioning. Always look in your shoes before you put them on to be sure there are no objects inside.  To break in new shoes, wear them for just a few hours a day. This prevents injuries on your feet. Wounds, scrapes, corns, and calluses  Check your feet daily for blisters, cuts, bruises, sores, and redness. If you cannot see the bottom of your feet, use a mirror or ask someone for help.  Do not cut corns or calluses or try to remove them with  medicine.  If you find a minor scrape, cut, or break in the skin on your feet, keep it and the skin around it clean and dry. You may clean these areas with mild soap and water. Do not clean the area with peroxide, alcohol, or iodine.  If you have a wound, scrape, corn, or callus on your foot, look at it several times a day to make sure it is healing and not infected. Check for: ? Redness, swelling, or pain. ? Fluid or blood. ? Warmth. ? Pus or a bad smell. General instructions  Do not cross your legs. This may decrease blood flow to your feet.  Do not use heating pads or hot water bottles on your feet. They may burn your skin. If you have lost feeling in your feet or legs, you may not know this is happening until it is too late.  Protect your feet from hot and cold by wearing   shoes, such as at the beach or on hot pavement.  Schedule a complete foot exam at least once a year (annually) or more often if you have foot problems. If you have foot problems, report any cuts, sores, or bruises to your health care provider immediately. Contact a health care provider if:  You have a medical condition that increases your risk of infection and you have any cuts, sores, or bruises on your feet.  You have an injury that is not healing.  You have redness on your legs or feet.  You feel burning or tingling in your legs or feet.  You have pain or cramps in your legs and feet.  Your legs or feet are numb.  Your feet always feel cold.  You have pain around a toenail. Get help right away if:  You have a wound, scrape, corn, or callus on your foot and: ? You have pain, swelling, or redness that gets worse. ? You have fluid or blood coming from the wound, scrape, corn, or callus. ? Your wound, scrape, corn, or callus feels warm to the touch. ? You have pus or a bad smell coming from the wound, scrape, corn, or callus. ? You have a fever. ? You have a red line going up your leg. Summary  Check  your feet every day for cuts, sores, red spots, swelling, and blisters.  Moisturize feet and legs daily.  Wear shoes that fit properly and have enough cushioning.  If you have foot problems, report any cuts, sores, or bruises to your health care provider immediately.  Schedule a complete foot exam at least once a year (annually) or more often if you have foot problems. This information is not intended to replace advice given to you by your health care provider. Make sure you discuss any questions you have with your health care provider. Document Released: 04/07/2000 Document Revised: 05/23/2017 Document Reviewed: 05/12/2016 Elsevier Interactive Patient Education  2019 [employer]  Onychomycosis/Fungal Toenails  WHAT IS IT? An infection that lies within the keratin of your nail plate that is caused by a fungus.  WHY ME? Fungal infections affect all ages, sexes, races, and creeds.  There may be many factors that predispose you to a fungal infection such as age, coexisting medical conditions such as diabetes, or an autoimmune disease; stress, medications, fatigue, genetics, etc.  Bottom line: fungus thrives in a warm, moist environment and your shoes offer such a location.  IS IT CONTAGIOUS? Theoretically, yes.  You do not want to share shoes, nail clippers or files with someone who has fungal toenails.  Walking around barefoot in the same room or sleeping in the same bed is unlikely to transfer the organism.  It is important to realize, however, that fungus can spread easily from one nail to the next on the same foot.  HOW DO WE TREAT THIS?  There are several ways to treat this condition.  Treatment may depend on many factors such as age, medications, pregnancy, liver and kidney conditions, etc.  It is best to ask your doctor which options are available to you.  1. No treatment.   Unlike many other medical concerns, you can live with this condition.  However for many people this can be a  painful condition and may lead to ingrown toenails or a bacterial infection.  It is recommended that you keep the nails cut short to help reduce the amount of fungal nail. 2. Topical treatment.  These range from herbal remedies   strength nail lacquers.  About 40-50% effective, topicals require twice daily application for approximately 9 to 12 months or until an entirely new nail has grown out.  The most effective topicals are medical grade medications available through physicians offices. 3. Oral antifungal medications.  With an 80-90% cure rate, the most common oral medication requires 3 to 4 months of therapy and stays in your system for a year as the new nail grows out.  Oral antifungal medications do require blood work to make sure it is a safe drug for you.  A liver function panel will be performed prior to starting the medication and after the first month of treatment.  It is important to have the blood work performed to avoid any harmful side effects.  In general, this medication safe but blood work is required. 4. Laser Therapy.  This treatment is performed by applying a specialized laser to the affected nail plate.  This therapy is noninvasive, fast, and non-painful.  It is not covered by insurance and is therefore, out of pocket.  The results have been very good with a 80-95% cure rate.  [facility] is the only practice in the area to offer this therapy. 5. Permanent Nail Avulsion.  Removing the entire nail so that a new nail will not grow back. 

## 2018-07-11 ENCOUNTER — Encounter: Payer: Self-pay | Admitting: Podiatry

## 2018-07-11 NOTE — Progress Notes (Signed)
Subjective: Dean Brown presents today for preventative diabetic foot care.  He is seen for  with painful, mycotic  toenails 1-5 b/l that he cannot cut and chronic calluses/corn which interfere with daily activities.  Pain is aggravated when wearing enclosed shoe gear and relieved with periodic professional debridement.  System, Pcp Not In    Current Outpatient Medications:  .  amLODipine (NORVASC) 10 MG tablet, Take 10 mg by mouth daily., Disp: , Rfl:  .  aspirin 81 MG chewable tablet, Chew 81 mg by mouth daily., Disp: , Rfl:  .  atorvastatin (LIPITOR) 80 MG tablet, Take 80 mg by mouth daily., Disp: , Rfl:  .  azelastine (ASTELIN) 137 MCG/SPRAY nasal spray, Place 1 spray into both nostrils 2 (two) times daily as needed for rhinitis. Use in each nostril as directed, Disp: , Rfl:  .  candesartan (ATACAND) 8 MG tablet, , Disp: , Rfl:  .  DULoxetine (CYMBALTA) 30 MG capsule, TK ONE C PO  BID FOR DEPRESSION AND PAIN, Disp: , Rfl:  .  furosemide (LASIX) 20 MG tablet, Take 20 mg by mouth daily as needed for edema., Disp: , Rfl:  .  gabapentin (NEURONTIN) 300 MG capsule, Take by mouth., Disp: , Rfl:  .  glucagon (GLUCAGEN) 1 MG SOLR, Inject 1 mg into the vein once as needed (severely low blood sugar)., Disp: , Rfl:  .  hydrochlorothiazide (HYDRODIURIL) 25 MG tablet, TK 1 T PO ONCE D, Disp: , Rfl:  .  insulin glargine (LANTUS) 100 UNIT/ML injection, Inject 22 Units into the skin at bedtime., Disp: , Rfl:  .  ketorolac (ACULAR) 0.5 % ophthalmic solution, INT 1 GTT IN OS FOUR TIMES DAILY. DO NOT. START UNTIL AFTER SURGERY FOR 30 DAYS, Disp: , Rfl:  .  lisinopril (PRINIVIL,ZESTRIL) 40 MG tablet, Take 40 mg by mouth daily., Disp: , Rfl:  .  losartan (COZAAR) 100 MG tablet, , Disp: , Rfl:  .  metFORMIN (GLUCOPHAGE-XR) 500 MG 24 hr tablet, Take 500 mg by mouth daily with breakfast., Disp: , Rfl:  .  methocarbamol (ROBAXIN) 500 MG tablet, Take 1 tablet (500 mg total) by mouth 2 (two) times daily., Disp: 20  tablet, Rfl: 0 .  metoprolol succinate (TOPROL-XL) 50 MG 24 hr tablet, , Disp: , Rfl:  .  MODAFINIL PO, Take 200 mg by mouth daily., Disp: , Rfl:  .  naproxen (NAPROSYN) 500 MG tablet, Take 500 mg by mouth 2 (two) times daily as needed. , Disp: , Rfl:  .  ofloxacin (OCUFLOX) 0.3 % ophthalmic solution, INT 1 GTT IN OS FOUR TIMES DAILY. DO NOT. START UNTIL AFTER SURGERY FOR 7 DAYS, Disp: , Rfl:  .  omeprazole (PRILOSEC) 20 MG capsule, Take 20 mg by mouth daily., Disp: , Rfl:  .  prednisoLONE acetate (PRED FORTE) 1 % ophthalmic suspension, SHAKE LQ AND INT 1 GTT IN OS FOUR TIMES DAILY. DO NOT. START UNTIL AFTER SURGERY FOR 30 DAYS, Disp: , Rfl:  .  rosuvastatin (CRESTOR) 40 MG tablet, , Disp: , Rfl:  .  SOLIQUA 100-33 UNT-MCG/ML SOPN, INJ 23 UNITS Dundy ONCE A DAY, Disp: , Rfl: 6 .  traMADol (ULTRAM) 50 MG tablet, Take 50 mg by mouth every 6 (six) hours as needed for moderate pain., Disp: , Rfl:  .  traZODone (DESYREL) 50 MG tablet, Take by mouth., Disp: , Rfl:  .  TRULICITY 1.5 QB/3.4LP SOPN, , Disp: , Rfl:  .  zolpidem (AMBIEN) 5 MG tablet, Take by mouth.,  Disp: , Rfl:   Current Facility-Administered Medications:  .  triamcinolone acetonide (KENALOG) 10 MG/ML injection 10 mg, 10 mg, Other, Once, Stover, Titorya, DPM .  triamcinolone acetonide (KENALOG) 10 MG/ML injection 10 mg, 10 mg, Other, Once, Stover, Titorya, DPM .  triamcinolone acetonide (KENALOG) 10 MG/ML injection 10 mg, 10 mg, Other, Once, Landis Martins, DPM  Allergies  Allergen Reactions  . No Known Allergies     Objective:  Vascular Examination: Capillary refill time immediate x 10 digits.  Dorsalis pedis and Posterior tibial pulses palpable 1/4 b/l.  Digital hair present x 10 digits.  Skin temperature gradient WNL b/l.  Dermatological Examination: Skin with normal turgor, texture and tone b/l.  Toenails 1-5 b/l discolored, thick, dystrophic with subungual debris and pain with palpation to nailbeds due to thickness of  nails.  Hyperkeratotic lesion medial hallux IPJ b/l and dorsal PIPJ right 5th digit. No erythema, no edema, no drainage, no flocculence noted.  Musculoskeletal: Muscle strength 5/5 to all LE muscle groups  Hammertoes 5th b/l.  No pain, crepitus or joint limitation noted with ROM.   Neurological: Sensation intact with 10 gram monofilament. Vibratory sensation intact.  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 Toenails 1-5 b/l were debrided in length and girth without iatrogenic bleeding. Calluses pared b/l hallux utilizing sterile scalpel blade without incident.Corn(s) pared right 5th digit utilizing sterile scalpel blade without incident. 2. Patient to continue soft, supportive shoe gear 3. Patient to report any pedal injuries to medical professional immediately. 4. Follow up 3 months.  5. Patient/POA to call should there be a concern in the interim.

## 2018-07-22 ENCOUNTER — Other Ambulatory Visit: Payer: Medicare Other | Admitting: Orthotics

## 2018-07-29 DIAGNOSIS — M79676 Pain in unspecified toe(s): Secondary | ICD-10-CM

## 2018-08-26 ENCOUNTER — Other Ambulatory Visit: Payer: Medicare Other | Admitting: Orthotics

## 2018-09-17 DIAGNOSIS — Z961 Presence of intraocular lens: Secondary | ICD-10-CM | POA: Insufficient documentation

## 2018-09-23 ENCOUNTER — Emergency Department (HOSPITAL_COMMUNITY): Payer: Medicare Other

## 2018-09-23 ENCOUNTER — Observation Stay (HOSPITAL_COMMUNITY)
Admission: EM | Admit: 2018-09-23 | Discharge: 2018-09-24 | Disposition: A | Discharge status: Home / Self Care | Payer: Medicare Other | Attending: Internal Medicine | Admitting: Internal Medicine

## 2018-09-23 ENCOUNTER — Ambulatory Visit: Payer: Medicare Other | Admitting: Orthotics

## 2018-09-23 ENCOUNTER — Encounter (HOSPITAL_COMMUNITY): Payer: Self-pay

## 2018-09-23 ENCOUNTER — Other Ambulatory Visit: Payer: Self-pay

## 2018-09-23 DIAGNOSIS — I10 Essential (primary) hypertension: Secondary | ICD-10-CM | POA: Insufficient documentation

## 2018-09-23 DIAGNOSIS — M7661 Achilles tendinitis, right leg: Secondary | ICD-10-CM

## 2018-09-23 DIAGNOSIS — Z7982 Long term (current) use of aspirin: Secondary | ICD-10-CM | POA: Diagnosis not present

## 2018-09-23 DIAGNOSIS — Z791 Long term (current) use of non-steroidal anti-inflammatories (NSAID): Secondary | ICD-10-CM | POA: Diagnosis not present

## 2018-09-23 DIAGNOSIS — N400 Enlarged prostate without lower urinary tract symptoms: Secondary | ICD-10-CM | POA: Insufficient documentation

## 2018-09-23 DIAGNOSIS — K219 Gastro-esophageal reflux disease without esophagitis: Secondary | ICD-10-CM | POA: Diagnosis not present

## 2018-09-23 DIAGNOSIS — G4733 Obstructive sleep apnea (adult) (pediatric): Secondary | ICD-10-CM | POA: Diagnosis not present

## 2018-09-23 DIAGNOSIS — R0789 Other chest pain: Principal | ICD-10-CM | POA: Insufficient documentation

## 2018-09-23 DIAGNOSIS — E119 Type 2 diabetes mellitus without complications: Secondary | ICD-10-CM | POA: Diagnosis not present

## 2018-09-23 DIAGNOSIS — E785 Hyperlipidemia, unspecified: Secondary | ICD-10-CM | POA: Diagnosis not present

## 2018-09-23 DIAGNOSIS — R079 Chest pain, unspecified: Secondary | ICD-10-CM | POA: Diagnosis not present

## 2018-09-23 DIAGNOSIS — Z794 Long term (current) use of insulin: Secondary | ICD-10-CM | POA: Insufficient documentation

## 2018-09-23 DIAGNOSIS — L84 Corns and callosities: Secondary | ICD-10-CM

## 2018-09-23 DIAGNOSIS — I4510 Unspecified right bundle-branch block: Secondary | ICD-10-CM | POA: Diagnosis not present

## 2018-09-23 DIAGNOSIS — Z79899 Other long term (current) drug therapy: Secondary | ICD-10-CM | POA: Diagnosis not present

## 2018-09-23 DIAGNOSIS — M2141 Flat foot [pes planus] (acquired), right foot: Secondary | ICD-10-CM

## 2018-09-23 DIAGNOSIS — Z1159 Encounter for screening for other viral diseases: Secondary | ICD-10-CM | POA: Insufficient documentation

## 2018-09-23 DIAGNOSIS — F329 Major depressive disorder, single episode, unspecified: Secondary | ICD-10-CM | POA: Insufficient documentation

## 2018-09-23 DIAGNOSIS — E876 Hypokalemia: Secondary | ICD-10-CM | POA: Insufficient documentation

## 2018-09-23 LAB — CBC
HCT: 43.8 % (ref 39.0–52.0)
Hemoglobin: 14.1 g/dL (ref 13.0–17.0)
MCH: 31.2 pg (ref 26.0–34.0)
MCHC: 32.2 g/dL (ref 30.0–36.0)
MCV: 96.9 fL (ref 80.0–100.0)
Platelets: 252 10*3/uL (ref 150–400)
RBC: 4.52 MIL/uL (ref 4.22–5.81)
RDW: 12.4 % (ref 11.5–15.5)
WBC: 7.2 10*3/uL (ref 4.0–10.5)
nRBC: 0 % (ref 0.0–0.2)

## 2018-09-23 LAB — HEPATIC FUNCTION PANEL
ALT: 34 U/L (ref 0–44)
AST: 21 U/L (ref 15–41)
Albumin: 4.2 g/dL (ref 3.5–5.0)
Alkaline Phosphatase: 75 U/L (ref 38–126)
Bilirubin, Direct: 0.1 mg/dL (ref 0.0–0.2)
Indirect Bilirubin: 0.3 mg/dL (ref 0.3–0.9)
Total Bilirubin: 0.4 mg/dL (ref 0.3–1.2)
Total Protein: 7.9 g/dL (ref 6.5–8.1)

## 2018-09-23 LAB — BASIC METABOLIC PANEL
Anion gap: 10 (ref 5–15)
BUN: 14 mg/dL (ref 8–23)
CO2: 28 mmol/L (ref 22–32)
Calcium: 9.5 mg/dL (ref 8.9–10.3)
Chloride: 103 mmol/L (ref 98–111)
Creatinine, Ser: 0.95 mg/dL (ref 0.61–1.24)
GFR calc Af Amer: >60 mL/min (ref >60)
GFR calc non Af Amer: >60 mL/min (ref >60)
Glucose, Bld: 113 mg/dL — ABNORMAL HIGH (ref 70–99)
Potassium: 3.6 mmol/L (ref 3.5–5.1)
Sodium: 141 mmol/L (ref 135–145)

## 2018-09-23 LAB — TROPONIN I: Troponin I: <0.03 ng/mL (ref <0.03)

## 2018-09-23 LAB — BRAIN NATRIURETIC PEPTIDE: B Natriuretic Peptide: 15 pg/mL (ref 0.0–100.0)

## 2018-09-23 LAB — SARS CORONAVIRUS 2 BY RT PCR (HOSPITAL ORDER, PERFORMED IN ~~LOC~~ HOSPITAL LAB): SARS Coronavirus 2: NEGATIVE

## 2018-09-23 MED ORDER — ASPIRIN 81 MG PO CHEW
324.0000 mg | CHEWABLE_TABLET | Freq: Once | ORAL | Status: AC
  Administered 2018-09-23: 324 mg via ORAL
  Filled 2018-09-23: qty 4

## 2018-09-23 MED ORDER — SODIUM CHLORIDE 0.9% FLUSH: 3.0000 mL | Freq: Once | INTRAVENOUS | Status: DC

## 2018-09-23 MED ORDER — MORPHINE SULFATE (PF) 4 MG/ML IV SOLN
4.0000 mg | Freq: Once | INTRAVENOUS | Status: AC
  Administered 2018-09-23: 20:00:00 4 mg via INTRAVENOUS
  Filled 2018-09-23: qty 1

## 2018-09-23 NOTE — Progress Notes (Signed)

## 2018-09-23 NOTE — H&P (Signed)
Dean Brown DGL:[id number] DOB: 12-16-1945 DOA: 09/23/2018     PCP: Enid Derry, PA-C   Outpatient Specialists:   CARDS Little Ishikawa, PA       Patient arrived to ER on 09/23/18 at 1621  Patient coming from: home Lives   With family    Chief Complaint:  Chief Complaint  Patient presents with  . Chest Pain    HPI: Dean Brown is a 73 y.o. male with medical history significant of HTN, DM 2, OSA on CPAP, HLD, BPH, depression, GERD    Presented with   Chest pain left side worse with exertion but so occurred at rest now. Reports chest pain has been intermittent occasionally associated shortness of breath. Denies any chest pain with deep inspiration. Intermittent chest pain for few days. History of atypical chest pain for which she is followed cardiology  Last stress test at [place] few years back Never had a catheterization last cardiac catheterization was in 2010 showed normal wall motion Chest pain-free  Infectious risk factors:  Reports   shortness of breath,    In RAPID COVID TEST NEGATIVE     Regarding pertinent Chronic problems:     Hyperlipidemia -  on statins Crestor   HTN on Norvasc, hydrochlorothiazide, Toprol and candesartan      DM 2 -  on insulin, Lantus 30 units reports lately blood sugar has been somewhat elevated     OSA -on nocturnal  CPAP     While in ER: Trop neg CP improved Sp Aspirin and Morphine  The following Work up has been ordered so far:  Orders Placed This Encounter  Procedures  . SARS Coronavirus 2 (CEPHEID - Performed in [place] hospital lab), [facility]  . DG Chest 2 View  . Basic metabolic panel  . CBC  . Troponin I - ONCE - STAT  . Brain natriuretic peptide  . Hepatic function panel  . Hemoglobin A1c  . Magnesium  . Phosphorus  . TSH  . Comprehensive metabolic panel  . CBC  . Diet heart healthy/carb modified Room service appropriate? Yes; Fluid consistency: Thin  . Cardiac monitoring  .  Saline Lock IV, Maintain IV access  . Cardiac monitoring  . STAT CBG when hypoglycemia is suspected. If treated, recheck every 15 minutes after each treatment until CBG >/= 70 mg/dl  . Refer to Hypoglycemia Protocol Sidebar Report for treatment of CBG < 70 mg/dl  . Vital signs  . Notify physician  . Up with assistance  . If patient diabetic or glucose greater than 140 notify physician for Sliding Scale Insulin Orders  . May go off telemetry for tests/procedures  . Oral care per nursing protocol  . Initiate Oral Care Protocol  . Initiate Carrier Fluid Protocol  . RN may order General Admission PRN Orders utilizing "General Admission PRN medications" (through manage orders) for the following patient needs: allergy symptoms (Claritin), cold sores (Carmex), cough (Robitussin DM), eye irritation (Liquifilm Tears), hemorrhoids (Tucks), indigestion (Maalox), minor skin irritation (Hydrocortisone Cream), muscle pain Suezanne Jacquet Gay), nose irritation (saline nasal spray) and sore throat (Chloraseptic spray).  . Patient has an active order for admit to inpatient/place in observation  . Cardiac Monitoring Continuous x 24 hours Indications for use: Other; other indications for use: chest pain  . Full code  . Consult to hospitalist  ALL PATIENTS BEING ADMITTED/HAVING PROCEDURES NEED COVID-19 SCREENING  . Consult to hospitalist  ALL PATIENTS BEING ADMITTED/HAVING PROCEDURES NEED COVID-19 SCREENING  .  Pulse oximetry, continuous  . Pulse oximetry check with vital signs  . Oxygen therapy Mode or (Route): Nasal cannula; Liters Per Minute: 2; Keep 02 saturation: greater than 92 %  . Incentive spirometry  . CPAP  . ED EKG within 10 minutes  . EKG 12-Lead  . EKG 12-Lead  . EKG 12-Lead  . ECHOCARDIOGRAM COMPLETE  . Place in observation (patient's expected length of stay will be less than 2 midnights)     Following Medications were ordered in ER: Medications  sodium chloride flush (NS) 0.9 % injection 3 mL (3 mLs  Intravenous Not Given 09/23/18 2206)  aspirin chewable tablet 81 mg (has no administration in time range)  amLODipine (NORVASC) tablet 10 mg (has no administration in time range)  rosuvastatin (CRESTOR) tablet 40 mg (has no administration in time range)  metoprolol succinate (TOPROL-XL) 24 hr tablet 50 mg (has no administration in time range)  irbesartan (AVAPRO) tablet 150 mg (has no administration in time range)  acetaminophen (TYLENOL) tablet 650 mg (has no administration in time range)    Or  acetaminophen (TYLENOL) suppository 650 mg (has no administration in time range)  HYDROcodone-acetaminophen (NORCO/VICODIN) 5-325 MG per tablet 1-2 tablet (has no administration in time range)  ondansetron (ZOFRAN) tablet 4 mg (has no administration in time range)    Or  ondansetron (ZOFRAN) injection 4 mg (has no administration in time range)  insulin glargine (LANTUS) injection 25 Units (has no administration in time range)  insulin aspart (novoLOG) injection 0-9 Units (has no administration in time range)  enoxaparin (LOVENOX) injection 40 mg (has no administration in time range)  sodium chloride flush (NS) 0.9 % injection 3 mL (has no administration in time range)  sodium chloride flush (NS) 0.9 % injection 3 mL (has no administration in time range)  0.9 %  sodium chloride infusion (has no administration in time range)  prednisoLONE acetate (PRED FORTE) 1 % ophthalmic suspension 1 drop (has no administration in time range)  ketorolac (ACULAR) 0.5 % ophthalmic solution 1 drop (has no administration in time range)  aspirin chewable tablet 324 mg (324 mg Oral Given 09/23/18 1924)  morphine 4 MG/ML injection 4 mg (4 mg Intravenous Given 09/23/18 1939)        Consult Orders  (From admission, onward)         Start     Ordered   09/23/18 2204  Consult to hospitalist  ALL PATIENTS BEING ADMITTED/HAVING PROCEDURES NEED COVID-19 SCREENING  Once    Comments:  ALL PATIENTS BEING ADMITTED/HAVING  PROCEDURES NEED COVID-19 SCREENING  Provider:  Toy Baker, MD  Question Answer Comment  Place call to: [facility]   Reason for Consult Admit      09/23/18 2203   09/23/18 2040  Consult to hospitalist  ALL PATIENTS BEING ADMITTED/HAVING PROCEDURES NEED COVID-19 SCREENING  Once    Comments:  ALL PATIENTS BEING ADMITTED/HAVING PROCEDURES NEED COVID-19 SCREENING  Provider:  Toy Baker, MD  Question Answer Comment  Place call to: [facility]   Reason for Consult Admit      09/23/18 2039           Significant initial  Findings: Abnormal Labs Reviewed  BASIC METABOLIC PANEL - Abnormal; Notable for the following components:      Result Value   Glucose, Bld 113 (*)    All other components within normal limits    Otherwise labs showing:    Recent Labs  Lab 09/23/18 1928  NA 141  K  3.6  CO2 28  GLUCOSE 113*  BUN 14  CREATININE 0.95  CALCIUM 9.5    Cr stable,    Lab Results  Component Value Date   CREATININE 0.95 09/23/2018   CREATININE 0.96 12/04/2013   CREATININE 0.93 12/03/2013    Recent Labs  Lab 09/23/18 1928  AST 21  ALT 34  ALKPHOS 75  BILITOT 0.4  PROT 7.9  ALBUMIN 4.2   Lab Results  Component Value Date   CALCIUM 9.5 09/23/2018     WBC      Component Value Date/Time   WBC 7.2 09/23/2018 1928   ANC    Component Value Date/Time   NEUTROABS 3.2 12/04/2013 0610     Plt: Lab Results  Component Value Date   PLT 252 09/23/2018       COVID-19 Labs    Lab Results  Component Value Date   SARSCOV2NAA NEGATIVE 09/23/2018     HG/HCT  stable,      Component Value Date/Time   HGB 14.1 09/23/2018 1928   HCT 43.8 09/23/2018 1928     Troponin   Cardiac Panel (last 3 results) Recent Labs    09/23/18 1928  TROPONINI <0.03      BNP (last 3 results) Recent Labs    09/23/18 1928  BNP 15.0       CXR -  NON acute     ECG:  Personally reviewed by me showing: HR : 77 Rhythm:   RBBB,   t WAVE  INVERSIONS BUT OLD no evidence of ischemic changes QTC 469      ED Triage Vitals  Enc Vitals Group     BP 09/23/18 1849 (!) 154/91     Pulse Rate 09/23/18 1849 72     Resp 09/23/18 1849 19     Temp --      Temp src --      SpO2 09/23/18 1849 100 %     Weight 09/23/18 1635 173 lb (78.5 kg)     Height 09/23/18 1635 5\' 5"  (1.651 m)     Head Circumference --      Peak Flow --      Pain Score 09/23/18 1634 4     Pain Loc --      Pain Edu? --      Excl. in [place]? --   TMAX(24)@       Latest  Blood pressure 139/77, pulse 66, resp. rate 18, height 5\' 5"  (1.651 m), weight 78.5 kg, SpO2 94 %.     Hospitalist was called for admission for Chest pain evaluation   Review of Systems:    Pertinent positives include:  chest pain,   Constitutional:  No weight loss, night sweats, Fevers, chills, fatigue, weight loss  HEENT:  No headaches, Difficulty swallowing,Tooth/dental problems,Sore throat,  No sneezing, itching, ear ache, nasal congestion, post nasal drip,  Cardio-vascular:  NoOrthopnea, PND, anasarca, dizziness, palpitations.no Bilateral lower extremity swelling  GI:  No heartburn, indigestion, abdominal pain, nausea, vomiting, diarrhea, change in bowel habits, loss of appetite, melena, blood in stool, hematemesis Resp:  no shortness of breath at rest. No dyspnea on exertion, No excess mucus, no productive cough, No non-productive cough, No coughing up of blood.No change in color of mucus.No wheezing. Skin:  no rash or lesions. No jaundice GU:  no dysuria, change in color of urine, no urgency or frequency. No straining to urinate.  No flank pain.  Musculoskeletal:  No joint pain or no joint swelling. No  decreased range of motion. No back pain.  Psych:  No change in mood or affect. No depression or anxiety. No memory loss.  Neuro: no localizing neurological complaints, no tingling, no weakness, no double vision, no gait abnormality, no slurred speech, no confusion  All systems  reviewed and apart from [place] all are negative  Past Medical History:   Past Medical History:  Diagnosis Date  . Arthritis   . Cancer [facility])    Prostate cancer  . Diabetes mellitus    type 2  . GERD (gastroesophageal reflux disease)   . Hypertension   . Pneumonia 12/03/2013  . Sleep apnea    cpap sleep study - [facility]      Past Surgical History:  Procedure Laterality Date  . KNEE SURGERY Right 10/2012  . LUMBAR LAMINECTOMY/DECOMPRESSION MICRODISCECTOMY  11/01/2011   Procedure: LUMBAR LAMINECTOMY/DECOMPRESSION MICRODISCECTOMY;  Surgeon: Sinclair Ship, MD;  Location: [facility];  Service: Orthopedics;  Laterality: Left;  Left sided lumbar 5- sacrum 1 microdisectomy  . microdiskectomy  11/01/2011  . PROSTATE SURGERY     s/p cancer  . ROTATOR CUFF REPAIR     left shoulder    Social History:  Ambulatory cain     reports that he quit smoking about 25 years ago. He has never used smokeless tobacco. He reports current alcohol use. He reports that he does not use drugs.    Family History:   Family History  Problem Relation Age of Onset  . Alzheimer's disease Mother   . Cancer Father     Allergies: Allergies  Allergen Reactions  . No Known Allergies      Prior to Admission medications   Medication Sig Start Date End Date Taking? Authorizing Provider  amLODipine (NORVASC) 10 MG tablet Take 10 mg by mouth daily.   Yes [provider]  aspirin 81 MG chewable tablet Chew 81 mg by mouth daily.   Yes [provider]  atorvastatin (LIPITOR) 80 MG tablet Take 80 mg by mouth daily.   Yes [provider]  azelastine (ASTELIN) 137 MCG/SPRAY nasal spray Place 1 spray into both nostrils 2 (two) times daily as needed for rhinitis. Use in each nostril as directed   Yes [provider]  buPROPion (WELLBUTRIN XL) 150 MG 24 hr tablet Take 150 mg by mouth daily.  07/31/18  Yes [provider]  candesartan (ATACAND) 8 MG tablet Take 8 mg by mouth  daily.  06/20/18  Yes [provider]  celecoxib (CELEBREX) 200 MG capsule Take 200 mg by mouth 2 (two) times daily.   Yes [provider]  DULoxetine (CYMBALTA) 60 MG capsule Take 60 mg by mouth daily.  07/31/18  Yes [provider]  gabapentin (NEURONTIN) 300 MG capsule Take 300 mg by mouth 4 (four) times daily.  01/19/16  Yes [provider]  hydrochlorothiazide (HYDRODIURIL) 25 MG tablet Take 25 mg by mouth daily.  05/21/18  Yes [provider]  LANTUS SOLOSTAR 100 UNIT/ML Solostar Pen Inject 30 Units into the skin at bedtime.  09/11/18  Yes [provider]  losartan (COZAAR) 100 MG tablet Take 100 mg by mouth daily.  05/30/18  Yes [provider]  metFORMIN (GLUCOPHAGE-XR) 500 MG 24 hr tablet Take 500 mg by mouth daily with breakfast.   Yes [provider]  methocarbamol (ROBAXIN) 500 MG tablet Take 1 tablet (500 mg total) by mouth 2 (two) times daily. Patient taking differently: Take 500 mg by mouth daily.  11/13/17  Yes Jacqlyn Larsen, PA-C  metoprolol succinate (TOPROL-XL) 50 MG 24 hr tablet Take 50 mg by mouth daily.  05/21/18  Yes [provider]  MODAFINIL PO Take 200 mg by mouth daily.   Yes [provider]  omeprazole (PRILOSEC) 20 MG capsule Take 20 mg by mouth daily.   Yes [provider]  rosuvastatin (CRESTOR) 40 MG tablet Take 40 mg by mouth daily.  06/20/18  Yes [provider]  zolpidem (AMBIEN) 10 MG tablet Take 10 mg by mouth at bedtime as needed for sleep.   Yes [provider]  furosemide (LASIX) 20 MG tablet Take 20 mg by mouth daily as needed for edema.    [provider]  glucagon (GLUCAGEN) 1 MG SOLR Inject 1 mg into the vein once as needed (severely low blood sugar).    [provider]  naproxen (NAPROSYN) 500 MG tablet Take 500 mg by mouth 2 (two) times daily as needed.     [provider]  OZEMPIC, 1 MG/DOSE, 2 MG/1.5ML SOPN Inject 1 mg  into the skin once a week.  07/08/18   [provider]   Physical Exam: Blood pressure 139/77, pulse 66, resp. rate 18, height 5\' 5"  (1.651 m), weight 78.5 kg, SpO2 94 %. 1. General:  in No  Acute distress   well -appearing 2. Psychological: Alert and  Oriented 3. Head/ENT:     Dry Mucous Membranes                          Head Non traumatic, neck supple                            Poor Dentition 4. SKIN:  decreased Skin turgor,  Skin clean Dry and intact no rash 5. Heart: Regular rate and rhythm no  Murmur, no Rub or gallop 6. Lungs:   no wheezes or crackles   7. Abdomen: Soft,  non-tender, Non distended  bowel sounds present 8. Lower extremities: no clubbing, cyanosis, no  edema 9. Neurologically Grossly intact, moving all 4 extremities equally  10. MSK: Normal range of motion   All other LABS:     Recent Labs  Lab 09/23/18 1928  WBC 7.2  HGB 14.1  HCT 43.8  MCV 96.9  PLT 252     Recent Labs  Lab 09/23/18 1928  NA 141  K 3.6  CL 103  CO2 28  GLUCOSE 113*  BUN 14  CREATININE 0.95  CALCIUM 9.5     Recent Labs  Lab 09/23/18 1928  AST 21  ALT 34  ALKPHOS 75  BILITOT 0.4  PROT 7.9  ALBUMIN 4.2       Cultures:    Component Value Date/Time   SDES URINE, RANDOM 12/03/2013 1731   SPECREQUEST NONE 12/03/2013 1731   CULT  12/03/2013 1555    NO GROWTH 5 DAYS Performed at [place] 12/04/2013 FINAL 12/03/2013 1731     Radiological Exams on Admission: Dg Chest 2 View  Result Date: 09/23/2018 CLINICAL DATA:  Left chest pain.  Shortness of breath. EXAM: CHEST - 2 VIEW COMPARISON:  November 13, 2017 FINDINGS: The heart size and mediastinal contours are within normal limits. Both lungs are clear. The visualized skeletal structures are unremarkable. IMPRESSION: No active cardiopulmonary disease. Electronically Signed   By: Dorise Bullion III M.D   On: 09/23/2018 17:42  Chart has been reviewed    Assessment/Plan  73 y.o. male  with medical history significant of HTN, DM 2, OSA on CPAP, HLD, BPH, depression, GERD  Admitted for Chest pain evaluation  Present on Admission: . Chest pain - - H=   1 ,E=0 ,A=  2  , R  2  , T 0 ,  for the  Total of 5  therefore will admit for observation and further evaluation ( Risk of MACE: Scores 0-3  of 0.9-1.7%.,  4-6: 12-16.6% , Scores ?7: 50-65% )   - monitor on telemetry, cycle cardiac enzymes, obtain serial ECG and  ECHO in AM.   - Daily aspirin -  Further risk stratify with lipid panel, hgA1C, obtain TSH.  Make sure patient is on Aspirin.  We will notify cardiology regarding patient's admission. Further management depends on pending  workup  . HLD (hyperlipidemia) -stable continue home medications . Obstructive apnea- stable continue CPAP  DM 2-  - Order Sensitive SSI   - continue home insulin regimen decrease to    Lantus 25 units, In case patient will need to be n.p.o.  -  check TSH and HgA1C  - Hold by mouth medications   HTN -continue home medications   Other plan as per orders.  DVT prophylaxis:  Lovenox     Code Status:  FULL CODE as per patient  I had personally discussed CODE STATUS with patient    Family Communication:   Family not at  Bedside    Disposition Plan:      To home once workup is complete and patient is stable                        Consults called: emailed cards   Admission status:  ED Disposition    ED Disposition Condition [place]: [place] [[id number]]  Level of Care: Telemetry [5]  Admit to tele based on following criteria: Monitor for Ischemic changes  Covid Evaluation: N/A  Diagnosis: Chest pain [[id number]]  Admitting Physician: Toy Baker [[id number]]  Attending Physician: Toy Baker [[id number]]  PT Class (Do Not Modify): Observation [104]  PT Acc Code (Do Not Modify): Observation [10022]         Obs    Level of care   tele  For   24H    Precautions: NONE   No active  isolations  PPE: Used by the provider:   P100  eye Goggles,  Gloves      Gentri Guardado 09/24/2018, 12:59 AM    [facility]     after 2 AM please page floor coverage PA If 7AM-7PM, please contact the day team taking care of the patient using [email or web address]

## 2018-09-23 NOTE — ED Triage Notes (Signed)
Patient c/o intermittent left chest pain and SOB since yesterday. Patient denies nausea or diaphoresis.

## 2018-09-23 NOTE — ED Provider Notes (Signed)
[facility] DEPT Provider Note   CSN: [id number] Arrival date & time: 09/23/18  1621    History   Chief Complaint Chief Complaint  Patient presents with  . Chest Pain    HPI Dean Brown is a 74 y.o. male.     The history is provided by the patient.  Chest Pain  Pain location:  Substernal area and L chest Pain quality: aching and pressure   Pain radiates to:  Does not radiate Pain severity:  Mild Onset quality:  Gradual Duration:  2 days Timing:  Intermittent Progression:  Waxing and waning Chronicity:  New Context: at rest   Relieved by:  Rest Worsened by:  Exertion Associated symptoms: no abdominal pain, no back pain, no cough, no fever, no palpitations, no shortness of breath, no syncope and no vomiting   Risk factors: diabetes mellitus, high cholesterol, hypertension and male sex   Risk factors: no coronary artery disease     Past Medical History:  Diagnosis Date  . Arthritis   . Cancer [facility])    Prostate cancer  . Diabetes mellitus    type 2  . GERD (gastroesophageal reflux disease)   . Hypertension   . Pneumonia 12/03/2013  . Sleep apnea    cpap sleep study - [facility]    Patient Active Problem List   Diagnosis Date Noted  . History of prostate cancer 12/04/2017  . Insomnia 09/12/2017  . ED (erectile dysfunction) 01/23/2017  . 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 Gross hematuria 11/16/2014  . 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 SIRS (systemic inflammatory response syndrome) ([place]) 12/03/2013  . CAP (community acquired pneumonia) 12/03/2013  . Acid reflux 12/03/2013  . BP (high blood pressure) 12/03/2013  . Arthritis, degenerative 12/03/2013  . Diabetes mellitus, type 2 ([place]) 12/03/2013  . History of penile implant 06/16/2013  . HLD (hyperlipidemia) 03/08/2012  . Obstructive apnea 08/04/2011  . 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 ACHILLES TENDINITIS 11/10/2008  .  FOOT PAIN, LEFT 11/10/2008  . PES PLANUS 11/10/2008  . HALLUX VALGUS 11/10/2008    Past Surgical History:  Procedure Laterality Date  . KNEE SURGERY Right 10/2012  . LUMBAR LAMINECTOMY/DECOMPRESSION MICRODISCECTOMY  11/01/2011   Procedure: LUMBAR LAMINECTOMY/DECOMPRESSION MICRODISCECTOMY;  Surgeon: Sinclair Ship, MD;  Location: [facility];  Service: Orthopedics;  Laterality: Left;  Left sided lumbar 5- sacrum 1 microdisectomy  . microdiskectomy  11/01/2011  . PROSTATE SURGERY     s/p cancer  . ROTATOR CUFF REPAIR     left shoulder        Home Medications    Prior to Admission medications   Medication Sig Start Date End Date Taking? Authorizing Provider  amLODipine (NORVASC) 10 MG tablet Take 10 mg by mouth daily.   Yes [provider]  aspirin 81 MG chewable tablet Chew 81 mg by mouth daily.   Yes [provider]  atorvastatin (LIPITOR) 80 MG tablet Take 80 mg by mouth daily.   Yes [provider]  azelastine (ASTELIN) 137 MCG/SPRAY nasal spray Place 1 spray into both nostrils 2 (two) times daily as needed for rhinitis. Use in each nostril as directed   Yes [provider]  buPROPion (WELLBUTRIN XL) 150 MG 24 hr tablet Take 150 mg by mouth daily.  07/31/18  Yes [provider]  candesartan (ATACAND) 8 MG tablet Take 8 mg by mouth daily.  06/20/18  Yes [provider]  celecoxib (CELEBREX) 200  MG capsule Take 200 mg by mouth 2 (two) times daily.   Yes [provider]  DULoxetine (CYMBALTA) 60 MG capsule Take 60 mg by mouth daily.  07/31/18  Yes [provider]  gabapentin (NEURONTIN) 300 MG capsule Take 300 mg by mouth 4 (four) times daily.  01/19/16  Yes [provider]  hydrochlorothiazide (HYDRODIURIL) 25 MG tablet Take 25 mg by mouth daily.  05/21/18  Yes [provider]  LANTUS SOLOSTAR 100 UNIT/ML Solostar Pen Inject 30 Units into the skin at bedtime.  09/11/18  Yes [provider]   losartan (COZAAR) 100 MG tablet Take 100 mg by mouth daily.  05/30/18  Yes [provider]  metFORMIN (GLUCOPHAGE-XR) 500 MG 24 hr tablet Take 500 mg by mouth daily with breakfast.   Yes [provider]  methocarbamol (ROBAXIN) 500 MG tablet Take 1 tablet (500 mg total) by mouth 2 (two) times daily. Patient taking differently: Take 500 mg by mouth daily.  11/13/17  Yes Jacqlyn Larsen, PA-C  metoprolol succinate (TOPROL-XL) 50 MG 24 hr tablet Take 50 mg by mouth daily.  05/21/18  Yes [provider]  MODAFINIL PO Take 200 mg by mouth daily.   Yes [provider]  omeprazole (PRILOSEC) 20 MG capsule Take 20 mg by mouth daily.   Yes [provider]  rosuvastatin (CRESTOR) 40 MG tablet Take 40 mg by mouth daily.  06/20/18  Yes [provider]  zolpidem (AMBIEN) 10 MG tablet Take 10 mg by mouth at bedtime as needed for sleep.   Yes [provider]  furosemide (LASIX) 20 MG tablet Take 20 mg by mouth daily as needed for edema.    [provider]  glucagon (GLUCAGEN) 1 MG SOLR Inject 1 mg into the vein once as needed (severely low blood sugar).    [provider]  naproxen (NAPROSYN) 500 MG tablet Take 500 mg by mouth 2 (two) times daily as needed.     [provider]  OZEMPIC, 1 MG/DOSE, 2 MG/1.5ML SOPN Inject 1 mg into the skin once a week.  07/08/18   [provider]    Family History Family History  Problem Relation Age of Onset  . Alzheimer's disease Mother   . Cancer Father     Social History Social History   Tobacco Use  . Smoking status: Former Smoker    Last attempt to quit: 05/04/1993    Years since quitting: 25.4  . Smokeless tobacco: Never Used  Substance Use Topics  . Alcohol use: Yes    Comment: ocassional  . Drug use: No     Allergies   No known allergies   Review of Systems Review of Systems  Constitutional: Negative for chills and fever.  HENT: Negative for ear pain and  sore throat.   Eyes: Negative for pain and visual disturbance.  Respiratory: Negative for cough and shortness of breath.   Cardiovascular: Positive for chest pain. Negative for palpitations and syncope.  Gastrointestinal: Negative for abdominal pain and vomiting.  Genitourinary: Negative for dysuria and hematuria.  Musculoskeletal: Negative for arthralgias and back pain.  Skin: Negative for color change and rash.  Neurological: Negative for seizures and syncope.  All other systems reviewed and are negative.    Physical Exam Updated Vital Signs  ED Triage Vitals  Enc Vitals Group     BP 09/23/18 1849 (!) 154/91     Pulse Rate 09/23/18 1849 72     Resp 09/23/18 1849  19     Temp --      Temp src --      SpO2 09/23/18 1849 100 %     Weight 09/23/18 1635 173 lb (78.5 kg)     Height 09/23/18 1635 5\' 5"  (1.651 m)     Head Circumference --      Peak Flow --      Pain Score 09/23/18 1634 4     Pain Loc --      Pain Edu? --      Excl. in [place]? --     Physical Exam Vitals signs and nursing note reviewed.  Constitutional:      General: He is not in acute distress.    Appearance: He is well-developed.  HENT:     Head: Normocephalic and atraumatic.  Eyes:     Conjunctiva/sclera: Conjunctivae normal.     Pupils: Pupils are equal, round, and reactive to light.  Neck:     Musculoskeletal: Normal range of motion and neck supple.  Cardiovascular:     Rate and Rhythm: Normal rate and regular rhythm.     Pulses:          Radial pulses are 2+ on the right side and 2+ on the left side.     Heart sounds: Normal heart sounds. No murmur.  Pulmonary:     Effort: Pulmonary effort is normal. No respiratory distress.     Breath sounds: No decreased breath sounds, wheezing, rhonchi or rales.  Abdominal:     Palpations: Abdomen is soft.     Tenderness: There is no abdominal tenderness.  Musculoskeletal: Normal range of motion.  Skin:    General: Skin is warm and dry.  Neurological:      Mental Status: He is alert.      ED Treatments / Results  Labs (all labs ordered are listed, but only abnormal results are displayed) Labs Reviewed  BASIC METABOLIC PANEL - Abnormal; Notable for the following components:      Result Value   Glucose, Bld 113 (*)    All other components within normal limits  SARS CORONAVIRUS 2 (HOSPITAL ORDER, [facility])  CBC  TROPONIN I  BRAIN NATRIURETIC PEPTIDE  HEPATIC FUNCTION PANEL    EKG EKG Interpretation  Date/Time:  Monday September 23 2018 16:37:23 EDT Ventricular Rate:  77 PR Interval:    QRS Duration: 133 QT Interval:  405 QTC Calculation: 459 R Axis:   -53 Text Interpretation:  Sinus rhythm RBBB and LAFB Non-specific abnormality, ST segment, and/or T-wave Reconfirmed by Lennice Sites [phone]) on 09/23/2018 7:15:30 PM   Radiology Dg Chest 2 View  Result Date: 09/23/2018 CLINICAL DATA:  Left chest pain.  Shortness of breath. EXAM: CHEST - 2 VIEW COMPARISON:  November 13, 2017 FINDINGS: The heart size and mediastinal contours are within normal limits. Both lungs are clear. The visualized skeletal structures are unremarkable. IMPRESSION: No active cardiopulmonary disease. Electronically Signed   By: Dorise Bullion III M.D   On: 09/23/2018 17:42    Procedures Procedures (including critical care time)  Medications Ordered in ED Medications  sodium chloride flush (NS) 0.9 % injection 3 mL (3 mLs Intravenous Not Given 09/23/18 2206)  aspirin chewable tablet 324 mg (324 mg Oral Given 09/23/18 1924)  morphine 4 MG/ML injection 4 mg (4 mg Intravenous Given 09/23/18 1939)     Initial Impression / Assessment and Plan / ED Course  I have reviewed the triage vital signs and  the nursing notes.  Pertinent labs & imaging results that were available during my care of the patient were reviewed by me and considered in my medical decision making (see chart for details).     Dean Brown is a 73 year old male with history of  hypertension, high cholesterol, diabetes who presents to the ED with chest pain.  Patient states that he has had intermittent chest pain since yesterday.  Patient had some shortness of breath yesterday but no longer today.  Had a stress test over a year ago that was unremarkable.  Has never had a stent placed.  Has never had a heart catheterization.  Typically does not have chest pain but has noticed more chest pain with activity today.  Feels better at rest.  Patient has clear breath sounds on exam.  Has had had any fever, cough, chills.  Patient with overall unremarkable vitals here.  Concern for ACS given multiple risk factors.  Patient is not hypoxic, not short of breath, no tachypnea and doubt PE.  EKG shows possibly new T wave inversions in V1 and V3 from prior.  Will get lab work including troponin.  Chest x-ray has ready been done that is unremarkable.  Will likely need admission given elevated heart score of 5.  Patient with normal troponin.  Otherwise no significant electrolyte abnormality.  Patient with negative COVID test.  BNP within normal limits.  Lipase and hepatic function panel within normal limits.  Patient with history and physical concerning for ACS and will be admitted for further work-up.  Pain improved following morphine and aspirin.  This chart was dictated using voice recognition software.  Despite best efforts to proofread,  errors can occur which can change the documentation meaning.   Final Clinical Impressions(s) / ED Diagnoses   Final diagnoses:  Chest pain, unspecified type    ED Discharge Orders    None       Lennice Sites, DO 09/23/18 2213

## 2018-09-24 ENCOUNTER — Observation Stay (HOSPITAL_BASED_OUTPATIENT_CLINIC_OR_DEPARTMENT_OTHER): Payer: Medicare Other

## 2018-09-24 DIAGNOSIS — E782 Mixed hyperlipidemia: Secondary | ICD-10-CM | POA: Diagnosis not present

## 2018-09-24 DIAGNOSIS — I10 Essential (primary) hypertension: Secondary | ICD-10-CM | POA: Diagnosis not present

## 2018-09-24 DIAGNOSIS — E1100 Type 2 diabetes mellitus with hyperosmolarity without nonketotic hyperglycemic-hyperosmolar coma (NKHHC): Secondary | ICD-10-CM | POA: Diagnosis not present

## 2018-09-24 DIAGNOSIS — G4733 Obstructive sleep apnea (adult) (pediatric): Secondary | ICD-10-CM

## 2018-09-24 DIAGNOSIS — Z1159 Encounter for screening for other viral diseases: Secondary | ICD-10-CM | POA: Diagnosis not present

## 2018-09-24 DIAGNOSIS — R079 Chest pain, unspecified: Secondary | ICD-10-CM

## 2018-09-24 DIAGNOSIS — E1159 Type 2 diabetes mellitus with other circulatory complications: Secondary | ICD-10-CM

## 2018-09-24 DIAGNOSIS — E785 Hyperlipidemia, unspecified: Secondary | ICD-10-CM | POA: Diagnosis not present

## 2018-09-24 DIAGNOSIS — R0789 Other chest pain: Secondary | ICD-10-CM | POA: Diagnosis not present

## 2018-09-24 DIAGNOSIS — E119 Type 2 diabetes mellitus without complications: Secondary | ICD-10-CM | POA: Diagnosis not present

## 2018-09-24 DIAGNOSIS — E876 Hypokalemia: Secondary | ICD-10-CM | POA: Diagnosis present

## 2018-09-24 LAB — LIPID PANEL
Cholesterol: 106 mg/dL (ref 0–200)
HDL: 37 mg/dL — ABNORMAL LOW (ref >40)
LDL Cholesterol: 49 mg/dL (ref 0–99)
Total CHOL/HDL Ratio: 2.9 RATIO
Triglycerides: 100 mg/dL (ref <150)
VLDL: 20 mg/dL (ref 0–40)

## 2018-09-24 LAB — TROPONIN I
Troponin I: <0.03 ng/mL (ref <0.03)
Troponin I: <0.03 ng/mL (ref <0.03)
Troponin I: <0.03 ng/mL (ref <0.03)

## 2018-09-24 LAB — COMPREHENSIVE METABOLIC PANEL
ALT: 27 U/L (ref 0–44)
AST: 17 U/L (ref 15–41)
Albumin: 3.4 g/dL — ABNORMAL LOW (ref 3.5–5.0)
Alkaline Phosphatase: 65 U/L (ref 38–126)
Anion gap: 9 (ref 5–15)
BUN: 14 mg/dL (ref 8–23)
CO2: 25 mmol/L (ref 22–32)
Calcium: 8.4 mg/dL — ABNORMAL LOW (ref 8.9–10.3)
Chloride: 104 mmol/L (ref 98–111)
Creatinine, Ser: 0.95 mg/dL (ref 0.61–1.24)
GFR calc Af Amer: >60 mL/min (ref >60)
GFR calc non Af Amer: >60 mL/min (ref >60)
Glucose, Bld: 231 mg/dL — ABNORMAL HIGH (ref 70–99)
Potassium: 3.4 mmol/L — ABNORMAL LOW (ref 3.5–5.1)
Sodium: 138 mmol/L (ref 135–145)
Total Bilirubin: 0.3 mg/dL (ref 0.3–1.2)
Total Protein: 6.3 g/dL — ABNORMAL LOW (ref 6.5–8.1)

## 2018-09-24 LAB — GLUCOSE, CAPILLARY
Glucose-Capillary: 120 mg/dL — ABNORMAL HIGH (ref 70–99)
Glucose-Capillary: 156 mg/dL — ABNORMAL HIGH (ref 70–99)
Glucose-Capillary: 64 mg/dL — ABNORMAL LOW (ref 70–99)
Glucose-Capillary: 100 mg/dL — ABNORMAL HIGH (ref 70–99)
Glucose-Capillary: 97 mg/dL (ref 70–99)
Glucose-Capillary: 122 mg/dL — ABNORMAL HIGH (ref 70–99)

## 2018-09-24 LAB — TSH: TSH: 2.06 u[IU]/mL (ref 0.350–4.500)

## 2018-09-24 LAB — CBC
HCT: 39.3 % (ref 39.0–52.0)
Hemoglobin: 12.2 g/dL — ABNORMAL LOW (ref 13.0–17.0)
MCH: 30.5 pg (ref 26.0–34.0)
MCHC: 31 g/dL (ref 30.0–36.0)
MCV: 98.3 fL (ref 80.0–100.0)
Platelets: 219 10*3/uL (ref 150–400)
RBC: 4 MIL/uL — ABNORMAL LOW (ref 4.22–5.81)
RDW: 12.4 % (ref 11.5–15.5)
WBC: 7.3 10*3/uL (ref 4.0–10.5)
nRBC: 0 % (ref 0.0–0.2)

## 2018-09-24 LAB — ECHOCARDIOGRAM COMPLETE
Height: 65 in
Weight: 2768 oz

## 2018-09-24 LAB — MAGNESIUM: Magnesium: 1.8 mg/dL (ref 1.7–2.4)

## 2018-09-24 LAB — HEMOGLOBIN A1C
Hgb A1c MFr Bld: 7.3 % — ABNORMAL HIGH (ref 4.8–5.6)
Mean Plasma Glucose: 162.81 mg/dL

## 2018-09-24 LAB — PHOSPHORUS: Phosphorus: 3.2 mg/dL (ref 2.5–4.6)

## 2018-09-24 MED ORDER — ONDANSETRON HCL 4 MG PO TABS: 4.0000 mg | ORAL_TABLET | Freq: Four times a day (QID) | ORAL | Status: DC | PRN

## 2018-09-24 MED ORDER — HYDROCODONE-ACETAMINOPHEN 5-325 MG PO TABS: 1.0000 | ORAL_TABLET | ORAL | Status: DC | PRN

## 2018-09-24 MED ORDER — INSULIN ASPART 100 UNIT/ML ~~LOC~~ SOLN
0.0000 [IU] | SUBCUTANEOUS | Status: DC
  Administered 2018-09-24: 2 [IU] via SUBCUTANEOUS

## 2018-09-24 MED ORDER — ROSUVASTATIN CALCIUM 20 MG PO TABS
40.0000 mg | ORAL_TABLET | Freq: Every day | ORAL | Status: DC
  Administered 2018-09-24: 40 mg via ORAL
  Filled 2018-09-24: qty 2

## 2018-09-24 MED ORDER — SODIUM CHLORIDE 0.9 % IV SOLN: 250.0000 mL | INTRAVENOUS | Status: DC | PRN

## 2018-09-24 MED ORDER — IRBESARTAN 150 MG PO TABS
150.0000 mg | ORAL_TABLET | Freq: Every day | ORAL | Status: DC
  Administered 2018-09-24: 150 mg via ORAL
  Filled 2018-09-24 (×2): qty 1

## 2018-09-24 MED ORDER — ASPIRIN 81 MG PO CHEW
81.0000 mg | CHEWABLE_TABLET | Freq: Every day | ORAL | Status: DC
  Administered 2018-09-24: 81 mg via ORAL
  Filled 2018-09-24: qty 1

## 2018-09-24 MED ORDER — POTASSIUM CHLORIDE CRYS ER 20 MEQ PO TBCR
40.0000 meq | EXTENDED_RELEASE_TABLET | Freq: Once | ORAL | Status: AC
  Administered 2018-09-24: 40 meq via ORAL
  Filled 2018-09-24: qty 2

## 2018-09-24 MED ORDER — KETOROLAC TROMETHAMINE 0.5 % OP SOLN
1.0000 [drp] | Freq: Four times a day (QID) | OPHTHALMIC | Status: DC
  Administered 2018-09-24 (×2): 1 [drp] via OPHTHALMIC
  Filled 2018-09-24: qty 5

## 2018-09-24 MED ORDER — INSULIN GLARGINE 100 UNIT/ML ~~LOC~~ SOLN
20.0000 [IU] | Freq: Every day | SUBCUTANEOUS | Status: DC
  Filled 2018-09-24: qty 0.2

## 2018-09-24 MED ORDER — AMLODIPINE BESYLATE 10 MG PO TABS
10.0000 mg | ORAL_TABLET | Freq: Every day | ORAL | Status: DC
  Administered 2018-09-24: 10 mg via ORAL
  Filled 2018-09-24: qty 1

## 2018-09-24 MED ORDER — ONDANSETRON HCL 4 MG/2ML IJ SOLN: 4.0000 mg | Freq: Four times a day (QID) | INTRAMUSCULAR | Status: DC | PRN

## 2018-09-24 MED ORDER — SODIUM CHLORIDE 0.9% FLUSH: 3.0000 mL | INTRAVENOUS | Status: DC | PRN

## 2018-09-24 MED ORDER — METHOCARBAMOL 500 MG PO TABS: 500.0000 mg | ORAL_TABLET | Freq: Every day | ORAL | Status: DC

## 2018-09-24 MED ORDER — INSULIN GLARGINE 100 UNIT/ML ~~LOC~~ SOLN
25.0000 [IU] | Freq: Every day | SUBCUTANEOUS | Status: DC
  Administered 2018-09-24: 02:00:00 25 [IU] via SUBCUTANEOUS
  Filled 2018-09-24: qty 0.25

## 2018-09-24 MED ORDER — METOPROLOL SUCCINATE ER 50 MG PO TB24
50.0000 mg | ORAL_TABLET | Freq: Every day | ORAL | Status: DC
  Administered 2018-09-24: 50 mg via ORAL
  Filled 2018-09-24: qty 1

## 2018-09-24 MED ORDER — ACETAMINOPHEN 325 MG PO TABS: 650.0000 mg | ORAL_TABLET | Freq: Four times a day (QID) | ORAL | Status: DC | PRN

## 2018-09-24 MED ORDER — SODIUM CHLORIDE 0.9% FLUSH
3.0000 mL | Freq: Two times a day (BID) | INTRAVENOUS | Status: DC
  Administered 2018-09-24 (×2): 3 mL via INTRAVENOUS

## 2018-09-24 MED ORDER — INSULIN ASPART 100 UNIT/ML ~~LOC~~ SOLN: 0.0000 [IU] | Freq: Three times a day (TID) | SUBCUTANEOUS | Status: DC

## 2018-09-24 MED ORDER — PREDNISOLONE ACETATE 1 % OP SUSP
1.0000 [drp] | Freq: Four times a day (QID) | OPHTHALMIC | Status: DC
  Administered 2018-09-24 (×2): 1 [drp] via OPHTHALMIC
  Filled 2018-09-24: qty 5

## 2018-09-24 MED ORDER — ENOXAPARIN SODIUM 40 MG/0.4ML ~~LOC~~ SOLN: 40.0000 mg | Freq: Every day | SUBCUTANEOUS | Status: DC

## 2018-09-24 MED ORDER — ACETAMINOPHEN 650 MG RE SUPP: 650.0000 mg | Freq: Four times a day (QID) | RECTAL | Status: DC | PRN

## 2018-09-24 NOTE — Discharge Instructions (Signed)

## 2018-09-24 NOTE — Progress Notes (Signed)
2D Echocardiogram has been performed.  Dean Brown 09/24/2018, 9:23 AM

## 2018-09-24 NOTE — Progress Notes (Addendum)
Progress Note    Dean Brown  UDJ:[id number] DOB: [birth year]  DOA: 09/23/2018 PCP: Enid Derry, PA-C    Brief Narrative:   Chief complaint: Follow-up chest pain  Medical records reviewed and are as summarized below:  Dean Brown is an 73 y.o. male with a PMH of hypertension, type 2 diabetes, OSA on CPAP, hyperlipidemia, BPH, depression and GERD who was admitted 09/23/2018 for evaluation of left-sided chest pain associated with both rest and exertion, and shortness of breath.  In the ED, troponins were noted to be negative and his chest pain improved after being given aspirin and morphine.  Assessment/Plan:   Principal Problem:   Chest pain Chest x-ray personally reviewed, clear.  Troponin negative x2.  Twelve-lead EKG personally reviewed and shows this rhythm at 67 bpm, left axis deviation and a right bundle branch block.  No ischemic changes appreciated.  Continue aspirin.  Continue to monitor on telemetry.  Cardiology consultation pending.  Has multiple cardiac risk factors including hypertension, hyperlipidemia, diabetes and male sex.  Active Problems:   Hypokalemia We will order 40 mEq of oral potassium replacement now.    HLD (hyperlipidemia) Continue Crestor.  LDL well controlled at 49.    Obstructive apnea Continue nocturnal CPAP.    DM2 (diabetes mellitus, type 2) ([place]) with long-term use of insulin and circulatory complications Currently being managed with Lantus 25 units daily, and insulin sensitive scale every 4 hours.  CBGs 64-156.  Will decrease Lantus to 20 units daily and change SSI to 3 times daily before meals with no bedtime coverage.  Hemoglobin A1c 7.3%, indicating good outpatient control.    Essential hypertension Continue Avapro and metoprolol.  Body mass index is 28.79 kg/m.   Family Communication/Anticipated D/C date and plan/Code Status   DVT prophylaxis: Lovenox ordered. Code Status: Full Code.  Family Communication: Patient  communicates with his family. Disposition Plan: Possibly home later today, depending on cardiology recommendations.   Medical Consultants:    Cardiology   Anti-Infectives:    None  Subjective:   No current complaints of chest pain, SOB or nausea.  Says he suffers from PTSD and thinks the [place] the country might be triggering him.   Objective:    Vitals:   09/23/18 2300 09/24/18 0000 09/24/18 0054 09/24/18 0442  BP: 140/79 126/85 (!) 149/80 123/79  Pulse: 67 69 70 62  Resp: 16 19 19 17   Temp:   98.1 F (36.7 C) 97.7 F (36.5 C)  TempSrc:   Oral Oral  SpO2: 96% 96% 100% 97%  Weight:      Height:       No intake or output data in the 24 hours ending 09/24/18 0848 Filed Weights   09/23/18 1635  Weight: 78.5 kg    Exam: General: No acute distress. Cardiovascular: Heart sounds show a regular rate, and rhythm. No gallops or rubs. No murmurs. No JVD. Lungs: Clear to auscultation bilaterally with good air movement. No rales, rhonchi or wheezes. Abdomen: Soft, nontender, nondistended with normal active bowel sounds. No masses. No hepatosplenomegaly. Neurological: Alert and oriented 3. Moves all extremities 4 with equal strength. Cranial nerves II through XII grossly intact. Skin: Warm and dry. No rashes or lesions. Extremities: No clubbing or cyanosis. No edema. Pedal pulses 2+. Psychiatric: Mood and affect are normal. Insight and judgment are normal.   Data Reviewed:   I have personally reviewed following labs and imaging studies:  Labs: Labs show the following:  Basic Metabolic Panel: Recent Labs  Lab 09/23/18 1928 09/24/18 0209  NA 141 138  K 3.6 3.4*  CL 103 104  CO2 28 25  GLUCOSE 113* 231*  BUN 14 14  CREATININE 0.95 0.95  CALCIUM 9.5 8.4*  MG  --  1.8  PHOS  --  3.2   GFR Estimated Creatinine Clearance: 68.9 mL/min (by C-G formula based on SCr of 0.95 mg/dL). Liver Function Tests: Recent Labs  Lab 09/23/18 1928 09/24/18  0209  AST 21 17  ALT 34 27  ALKPHOS 75 65  BILITOT 0.4 0.3  PROT 7.9 6.3*  ALBUMIN 4.2 3.4*   CBC: Recent Labs  Lab 09/23/18 1928 09/24/18 0209  WBC 7.2 7.3  HGB 14.1 12.2*  HCT 43.8 39.3  MCV 96.9 98.3  PLT 252 219   Cardiac Enzymes: Recent Labs  Lab 09/23/18 1928 09/24/18 0209  TROPONINI <0.03 <0.03   CBG: Recent Labs  Lab 09/24/18 0058 09/24/18 0443 09/24/18 0751  GLUCAP 120* 156* 64*   Hgb A1c: Recent Labs    09/24/18 0209  HGBA1C 7.3*   Lipid Profile: Recent Labs    09/24/18 0209  CHOL 106  HDL 37*  LDLCALC 49  TRIG 100  CHOLHDL 2.9   Thyroid function studies: Recent Labs    09/24/18 0209  TSH 2.060   Sepsis Labs: Recent Labs  Lab 09/23/18 1928 09/24/18 0209  WBC 7.2 7.3    Microbiology Recent Results (from the past 240 hour(s))  SARS Coronavirus 2 (CEPHEID - Performed in [facility] hospital lab), Hosp Order     Status: None   Collection Time: 09/23/18  7:28 PM  Result Value Ref Range Status   SARS Coronavirus 2 NEGATIVE NEGATIVE Final    Comment: (NOTE) If result is NEGATIVE SARS-CoV-2 target nucleic acids are NOT DETECTED. The SARS-CoV-2 RNA is generally detectable in upper and lower  respiratory specimens during the acute phase of infection. The lowest  concentration of SARS-CoV-2 viral copies this assay can detect is 250  copies / mL. A negative result does not preclude SARS-CoV-2 infection  and should not be used as the sole basis for treatment or other  patient management decisions.  A negative result may occur with  improper specimen collection / handling, submission of specimen other  than nasopharyngeal swab, presence of viral mutation(s) within the  areas targeted by this assay, and inadequate number of viral copies  (<250 copies / mL). A negative result must be combined with clinical  observations, patient history, and epidemiological information. If result is POSITIVE SARS-CoV-2 target nucleic acids are DETECTED.  The SARS-CoV-2 RNA is generally detectable in upper and lower  respiratory specimens dur ing the acute phase of infection.  Positive  results are indicative of active infection with SARS-CoV-2.  Clinical  correlation with patient history and other diagnostic information is  necessary to determine patient infection status.  Positive results do  not rule out bacterial infection or co-infection with other viruses. If result is PRESUMPTIVE POSTIVE SARS-CoV-2 nucleic acids MAY BE PRESENT.   A presumptive positive result was obtained on the submitted specimen  and confirmed on repeat testing.  While 07-28-2017 novel coronavirus  (SARS-CoV-2) nucleic acids may be present in the submitted sample  additional confirmatory testing may be necessary for epidemiological  and / or clinical management purposes  to differentiate between  SARS-CoV-2 and other Sarbecovirus currently known to infect humans.  If clinically indicated additional testing with an alternate test  methodology [phone]) is  advised. The SARS-CoV-2 RNA is generally  detectable in upper and lower respiratory sp ecimens during the acute  phase of infection. The expected result is Negative. Fact Sheet for Patients:  StrictlyIdeas.no Fact Sheet for Healthcare Providers: BankingDealers.co.za This test is not yet approved or cleared by the [place] FDA and has been authorized for detection and/or diagnosis of SARS-CoV-2 by FDA under an Emergency Use Authorization (EUA).  This EUA will remain in effect (meaning this test can be used) for the duration of the COVID-19 declaration under Section 564(b)(1) of the Act, 21 U.S.C. section 360bbb-3(b)(1), unless the authorization is terminated or revoked sooner. Performed at [facility], [place] [address] [place], [place] [address]     Procedures and diagnostic studies:  Dg Chest 2 View  Result Date: 09/23/2018 CLINICAL DATA:   Left chest pain.  Shortness of breath. EXAM: CHEST - 2 VIEW COMPARISON:  November 13, 2017 FINDINGS: The heart size and mediastinal contours are within normal limits. Both lungs are clear. The visualized skeletal structures are unremarkable. IMPRESSION: No active cardiopulmonary disease. Electronically Signed   By: Dorise Bullion III M.D   On: 09/23/2018 17:42    Medications:   . amLODipine  10 mg Oral Daily  . aspirin  81 mg Oral Daily  . enoxaparin (LOVENOX) injection  40 mg Subcutaneous QHS  . insulin aspart  0-9 Units Subcutaneous Q4H  . insulin glargine  25 Units Subcutaneous QHS  . irbesartan  150 mg Oral Daily  . ketorolac  1 drop Left Eye QID  . metoprolol succinate  50 mg Oral Daily  . prednisoLONE acetate  1 drop Left Eye QID  . rosuvastatin  40 mg Oral Daily  . sodium chloride flush  3 mL Intravenous Once  . sodium chloride flush  3 mL Intravenous Q12H   Continuous Infusions: . sodium chloride       LOS: 0 days   Jacquelynn Cree  [facility] Pager [phone]. If unable to reach me by pager, please call my cell phone at [phone].  *Please refer to [email or web address], password [email or web address] to get updated schedule on who will round on this patient, as hospitalists switch teams weekly. If 7PM-7AM, please contact night-coverage at [email or web address], password [email or web address] for any overnight needs.  09/24/2018, 8:48 AM

## 2018-09-24 NOTE — Progress Notes (Signed)
Discharge instructions given to and reviewed with patient. Verbalized understanding. Taken via wheelchair to front entrance for discharge.

## 2018-09-24 NOTE — Progress Notes (Signed)
Hypoglycemic Event  CBG: 64  Treatment: 4 oz juice/soda  Symptoms: None  Follow-up CBG: Time:0916 CBG Result:100  Possible Reasons for Event: Unknown  Comments/MD notified:Dr. Rama notified. Delayed follow up d/t ECHO in process.    Rosana Fret

## 2018-09-24 NOTE — Discharge Summary (Addendum)
Physician Discharge Summary  [name] JIR:[id number] DOB: 1945-06-25 DOA: 09/23/2018  PCP: Enid Derry, PA-C  Admit date: 09/23/2018 Discharge date: 09/24/2018  Admitted From: Home Discharge disposition: Home   Recommendations for Outpatient Follow-Up:   1. Routine follow up recommended.   Discharge Diagnosis:   Principal Problem:   Chest pain Active Problems:   HLD (hyperlipidemia)   Obstructive apnea   DM2 (diabetes mellitus, type 2) (HCC)   Essential hypertension   Hypokalemia  Discharge Condition: Improved.  Diet recommendation: Low sodium, heart healthy.  Carbohydrate-modified.     Code status: Full.   History of Present Illness:   Dean Brown is an 73 y.o. male with a PMH of hypertension, type 2 diabetes, OSA on CPAP, hyperlipidemia, BPH, depression and GERD who was admitted 09/23/2018 for evaluation of left-sided chest pain associated with both rest and exertion, and shortness of breath.  In the ED, troponins were noted to be negative and his chest pain improved after being given aspirin and morphine.  Hospital Course by Problem:   Principal Problem:   Chest pain Chest x-ray personally reviewed, clear.  Troponin negative x3.  Twelve-lead EKG personally reviewed and showed NSR at 67 bpm, left axis deviation and a right bundle branch block.  No ischemic changes appreciated.  Continue aspirin.  Continue to monitor on telemetry.  Cardiology consultation performed, no further inpatient work up recommended.  .  Active Problems:   Hypokalemia Given 40 mEq of oral potassium replacement.    HLD (hyperlipidemia) Continue Crestor.  LDL well controlled at 49.    Obstructive apnea Continue nocturnal CPAP.    DM2 (diabetes mellitus, type 2) ([place]) with long-term use of insulin and circulatory complications Currently being managed with Lantus 25 units daily, and insulin sensitive scale every 4 hours.  CBGs 64-156.  Will decrease Lantus to 20 units daily  and change SSI to 3 times daily before meals with no bedtime coverage.  Hemoglobin A1c 7.3%, indicating good outpatient control. Resume outpatient regimen at discharge.    Essential hypertension Continue Avapro and metoprolol.    Medical Consultants:    Cardiology   Discharge Exam:   Vitals:   09/24/18 1015 09/24/18 1408  BP: 122/80 112/71  Pulse: 70 69  Resp: 18 16  Temp: 97.6 F (36.4 C) 97.9 F (36.6 C)  SpO2: 97% 97%   Vitals:   09/24/18 0054 09/24/18 0442 09/24/18 1015 09/24/18 1408  BP: (!) 149/80 123/79 122/80 112/71  Pulse: 70 62 70 69  Resp: 19 17 18 16   Temp: 98.1 F (36.7 C) 97.7 F (36.5 C) 97.6 F (36.4 C) 97.9 F (36.6 C)  TempSrc: Oral Oral Oral Oral  SpO2: 100% 97% 97% 97%  Weight:      Height:        General exam: Appears calm and comfortable.  Respiratory system: Clear to auscultation. Respiratory effort normal. Cardiovascular system: S1 & S2 heard, RRR. No JVD,  rubs, gallops or clicks. No murmurs. Gastrointestinal system: Abdomen is nondistended, soft and nontender. No organomegaly or masses felt. Normal bowel sounds heard. Central nervous system: Alert and oriented. No focal neurological deficits. Extremities: No clubbing,  or cyanosis. No edema. Skin: No rashes, lesions or ulcers. Psychiatry: Judgement and insight appear normal. Mood & affect appropriate.    The results of significant diagnostics from this hospitalization (including imaging, microbiology, ancillary and laboratory) are listed below for reference.     Procedures and Diagnostic Studies:   Dg Chest 2 View  Result Date: 09/23/2018 CLINICAL DATA:  Left chest pain.  Shortness of breath. EXAM: CHEST - 2 VIEW COMPARISON:  November 13, 2017 FINDINGS: The heart size and mediastinal contours are within normal limits. Both lungs are clear. The visualized skeletal structures are unremarkable. IMPRESSION: No active cardiopulmonary disease. Electronically Signed   By: Dorise Bullion III  M.D   On: 09/23/2018 17:42     Labs:   Basic Metabolic Panel: Recent Labs  Lab 09/23/18 1928 09/24/18 0209  NA 141 138  K 3.6 3.4*  CL 103 104  CO2 28 25  GLUCOSE 113* 231*  BUN 14 14  CREATININE 0.95 0.95  CALCIUM 9.5 8.4*  MG  --  1.8  PHOS  --  3.2   GFR Estimated Creatinine Clearance: 68.9 mL/min (by C-G formula based on SCr of 0.95 mg/dL). Liver Function Tests: Recent Labs  Lab 09/23/18 1928 09/24/18 0209  AST 21 17  ALT 34 27  ALKPHOS 75 65  BILITOT 0.4 0.3  PROT 7.9 6.3*  ALBUMIN 4.2 3.4*   CBC: Recent Labs  Lab 09/23/18 1928 09/24/18 0209  WBC 7.2 7.3  HGB 14.1 12.2*  HCT 43.8 39.3  MCV 96.9 98.3  PLT 252 219   Cardiac Enzymes: Recent Labs  Lab 09/23/18 1928 09/24/18 0209 09/24/18 0828 09/24/18 1324  TROPONINI <0.03 <0.03 <0.03 <0.03   CBG: Recent Labs  Lab 09/24/18 0058 09/24/18 0443 09/24/18 0751 09/24/18 0916 09/24/18 1156  GLUCAP 120* 156* 64* 100* 97   Hgb A1c Recent Labs    09/24/18 0209  HGBA1C 7.3*   Lipid Profile Recent Labs    09/24/18 0209  CHOL 106  HDL 37*  LDLCALC 49  TRIG 100  CHOLHDL 2.9   Thyroid function studies Recent Labs    09/24/18 0209  TSH 2.060   Microbiology Recent Results (from the past 240 hour(s))  SARS Coronavirus 2 (CEPHEID - Performed in [place] hospital lab), Hosp Order     Status: None   Collection Time: 09/23/18  7:28 PM  Result Value Ref Range Status   SARS Coronavirus 2 NEGATIVE NEGATIVE Final    Comment: (NOTE) If result is NEGATIVE SARS-CoV-2 target nucleic acids are NOT DETECTED. The SARS-CoV-2 RNA is generally detectable in upper and lower  respiratory specimens during the acute phase of infection. The lowest  concentration of SARS-CoV-2 viral copies this assay can detect is 250  copies / mL. A negative result does not preclude SARS-CoV-2 infection  and should not be used as the sole basis for treatment or other  patient management decisions.  A negative result  may occur with  improper specimen collection / handling, submission of specimen other  than nasopharyngeal swab, presence of viral mutation(s) within the  areas targeted by this assay, and inadequate number of viral copies  (<250 copies / mL). A negative result must be combined with clinical  observations, patient history, and epidemiological information. If result is POSITIVE SARS-CoV-2 target nucleic acids are DETECTED. The SARS-CoV-2 RNA is generally detectable in upper and lower  respiratory specimens dur ing the acute phase of infection.  Positive  results are indicative of active infection with SARS-CoV-2.  Clinical  correlation with patient history and other diagnostic information is  necessary to determine patient infection status.  Positive results do  not rule out bacterial infection or co-infection with other viruses. If result is PRESUMPTIVE POSTIVE SARS-CoV-2 nucleic acids MAY BE PRESENT.   A presumptive positive result was obtained on the submitted specimen  and confirmed on repeat testing.  While 2019 novel coronavirus  (SARS-CoV-2) nucleic acids may be present in the submitted sample  additional confirmatory testing may be necessary for epidemiological  and / or clinical management purposes  to differentiate between  SARS-CoV-2 and other Sarbecovirus currently known to infect humans.  If clinically indicated additional testing with an alternate test  methodology [phone]) is advised. The SARS-CoV-2 RNA is generally  detectable in upper and lower respiratory sp ecimens during the acute  phase of infection. The expected result is Negative. Fact Sheet for Patients:  StrictlyIdeas.no Fact Sheet for Healthcare Providers: BankingDealers.co.za This test is not yet approved or cleared by the [place] FDA and has been authorized for detection and/or diagnosis of SARS-CoV-2 by FDA under an Emergency Use Authorization (EUA).   This EUA will remain in effect (meaning this test can be used) for the duration of the COVID-19 declaration under Section 564(b)(1) of the Act, 21 U.S.C. section 360bbb-3(b)(1), unless the authorization is terminated or revoked sooner. Performed at [facility], [place] [address], [place], [place] [address]      Discharge Instructions:   Discharge Instructions    Call MD for:  difficulty breathing, headache or visual disturbances   Complete by:  As directed    Call MD for:  extreme fatigue   Complete by:  As directed    Call MD for:  persistant dizziness or light-headedness   Complete by:  As directed    Call MD for:  severe uncontrolled pain   Complete by:  As directed    Diet - low sodium heart healthy   Complete by:  As directed    Diet Carb Modified   Complete by:  As directed    Discharge instructions   Complete by:  As directed    Your heart tests were negative for an acute heart problem.  You can follow up with your regular heart doctor as needed.   Increase activity slowly   Complete by:  As directed      Allergies as of 09/24/2018      Reactions   No Known Allergies       Medication List    TAKE these medications   amLODipine 10 MG tablet Commonly known as:  NORVASC Take 10 mg by mouth daily.   aspirin 81 MG chewable tablet Chew 81 mg by mouth daily.   atorvastatin 80 MG tablet Commonly known as:  LIPITOR Take 80 mg by mouth daily.   azelastine 0.1 % nasal spray Commonly known as:  ASTELIN Place 1 spray into both nostrils 2 (two) times daily as needed for rhinitis. Use in each nostril as directed   buPROPion 150 MG 24 hr tablet Commonly known as:  WELLBUTRIN XL Take 150 mg by mouth daily.   candesartan 8 MG tablet Commonly known as:  ATACAND Take 8 mg by mouth daily.   celecoxib 200 MG capsule Commonly known as:  CELEBREX Take 200 mg by mouth 2 (two) times daily.   DULoxetine 60 MG capsule Commonly known as:  CYMBALTA Take  60 mg by mouth daily.   furosemide 20 MG tablet Commonly known as:  LASIX Take 20 mg by mouth daily as needed for edema.   gabapentin 300 MG capsule Commonly known as:  NEURONTIN Take 300 mg by mouth 4 (four) times daily.   GlucaGen 1 MG Solr injection Generic drug:  glucagon Inject 1 mg into the vein once as needed (severely low blood sugar).  hydrochlorothiazide 25 MG tablet Commonly known as:  HYDRODIURIL Take 25 mg by mouth daily.   Lantus SoloStar 100 UNIT/ML Solostar Pen Generic drug:  Insulin Glargine Inject 30 Units into the skin at bedtime.   losartan 100 MG tablet Commonly known as:  COZAAR Take 100 mg by mouth daily.   metFORMIN 500 MG 24 hr tablet Commonly known as:  GLUCOPHAGE-XR Take 500 mg by mouth daily with breakfast.   methocarbamol 500 MG tablet Commonly known as:  ROBAXIN Take 1 tablet (500 mg total) by mouth daily.   metoprolol succinate 50 MG 24 hr tablet Commonly known as:  TOPROL-XL Take 50 mg by mouth daily.   MODAFINIL PO Take 200 mg by mouth daily.   naproxen 500 MG tablet Commonly known as:  NAPROSYN Take 500 mg by mouth 2 (two) times daily as needed.   omeprazole 20 MG capsule Commonly known as:  PRILOSEC Take 20 mg by mouth daily.   Ozempic (1 MG/DOSE) 2 MG/1.5ML Sopn Generic drug:  Semaglutide (1 MG/DOSE) Inject 1 mg into the skin once a week.   rosuvastatin 40 MG tablet Commonly known as:  CRESTOR Take 40 mg by mouth daily.   zolpidem 10 MG tablet Commonly known as:  AMBIEN Take 10 mg by mouth at bedtime as needed for sleep.      Follow-up Information    Whiteheart, Dyann Kief, PA-C. Schedule an appointment as soon as possible for a visit.   Specialty:  Family Medicine Why:  Call as needed for routine follow up. Contact information: [address] [place] [address] [phone]            Time coordinating discharge: 30 minutes.  SignedMargreta Journey Keanon Bevins  Pager [phone]  [facility] 09/24/2018, 4:27 PM

## 2018-09-24 NOTE — ED Notes (Signed)
ED TO INPATIENT HANDOFF REPORT  Name/Age/Gender Dean Brown 73 y.o. male  Code Status Code Status History    Date Active Date Inactive Code Status Order ID Comments User Context   12/03/2013 1617 12/04/2013 1909 Full Code [id number]  Blain Pais, MD Inpatient   11/01/2011 2237 11/02/2011 1629 Full Code [id number]  Roosvelt Harps, RN Inpatient      Home/SNF/Other Home  Chief Complaint chest pain  Level of Care/Admitting Diagnosis ED Disposition    ED Disposition Condition [facility]: [facility] [[id number]]  Level of Care: Telemetry [5]  Admit to tele based on following criteria: Monitor for Ischemic changes  Covid Evaluation: N/A  Diagnosis: Chest pain [601093]  Admitting Physician: Toy Baker [[id number]]  Attending Physician: Toy Baker [[id number]]  PT Class (Do Not Modify): Observation [104]  PT Acc Code (Do Not Modify): Observation [10022]       Medical History Past Medical History:  Diagnosis Date  . Arthritis   . Cancer [facility])    Prostate cancer  . Diabetes mellitus    type 2  . GERD (gastroesophageal reflux disease)   . Hypertension   . Pneumonia 12/03/2013  . Sleep apnea    cpap sleep study - [facility]    Allergies Allergies  Allergen Reactions  . No Known Allergies     IV Location/Drains/Wounds Patient Lines/Drains/Airways Status   Active Line/Drains/Airways    None          Labs/Imaging Results for orders placed or performed during the hospital encounter of 09/23/18 (from the past 48 hour(s))  Basic metabolic panel     Status: Abnormal   Collection Time: 09/23/18  7:28 PM  Result Value Ref Range   Sodium 141 135 - 145 mmol/L   Potassium 3.6 3.5 - 5.1 mmol/L   Chloride 103 98 - 111 mmol/L   CO2 28 22 - 32 mmol/L   Glucose, Bld 113 (H) 70 - 99 mg/dL   BUN 14 8 - 23 mg/dL   Creatinine, Ser 0.95 0.61 - 1.24 mg/dL   Calcium 9.5 8.9 - 10.3 mg/dL   GFR calc non Af Amer >60 >60 mL/min   GFR calc Af Amer >60 >60 mL/min   Anion gap 10 5 - 15    Comment: Performed at [facility], [place] [address], [place], [place] [address]  CBC     Status: None   Collection Time: 09/23/18  7:28 PM  Result Value Ref Range   WBC 7.2 4.0 - 10.5 K/uL   RBC 4.52 4.22 - 5.81 MIL/uL   Hemoglobin 14.1 13.0 - 17.0 g/dL   HCT 43.8 39.0 - 52.0 %   MCV 96.9 80.0 - 100.0 fL   MCH 31.2 26.0 - 34.0 pg   MCHC 32.2 30.0 - 36.0 g/dL   RDW 12.4 11.5 - 15.5 %   Platelets 252 150 - 400 K/uL   nRBC 0.0 0.0 - 0.2 %    Comment: Performed at [facility], [place] [address], [place], [place] [address]  Troponin I - ONCE - STAT     Status: None   Collection Time: 09/23/18  7:28 PM  Result Value Ref Range   Troponin I <0.03 <0.03 ng/mL    Comment: Performed at [facility], [place] [address] [place], [place] [address]  Brain natriuretic peptide     Status: None   Collection Time: 09/23/18  7:28 PM  Result Value Ref  Range   B Natriuretic Peptide 15.0 0.0 - 100.0 pg/mL    Comment: Performed at [facility], [place] [address] [place], [place] [address]  Hepatic function panel     Status: None   Collection Time: 09/23/18  7:28 PM  Result Value Ref Range   Total Protein 7.9 6.5 - 8.1 g/dL   Albumin 4.2 3.5 - 5.0 g/dL   AST 21 15 - 41 U/L   ALT 34 0 - 44 U/L   Alkaline Phosphatase 75 38 - 126 U/L   Total Bilirubin 0.4 0.3 - 1.2 mg/dL   Bilirubin, Direct 0.1 0.0 - 0.2 mg/dL   Indirect Bilirubin 0.3 0.3 - 0.9 mg/dL    Comment: Performed at [facility], [place] [address], [place], [place] [address]  SARS Coronavirus 2 (CEPHEID - Performed in [place] hospital lab), Hosp Order     Status: None   Collection Time: 09/23/18  7:28 PM  Result Value Ref Range   SARS Coronavirus 2 NEGATIVE NEGATIVE    Comment: (NOTE) If result is NEGATIVE SARS-CoV-2 target nucleic acids are NOT DETECTED. The SARS-CoV-2 RNA is generally detectable  in upper and lower  respiratory specimens during the acute phase of infection. The lowest  concentration of SARS-CoV-2 viral copies this assay can detect is 250  copies / mL. A negative result does not preclude SARS-CoV-2 infection  and should not be used as the sole basis for treatment or other  patient management decisions.  A negative result may occur with  improper specimen collection / handling, submission of specimen other  than nasopharyngeal swab, presence of viral mutation(s) within the  areas targeted by this assay, and inadequate number of viral copies  (<250 copies / mL). A negative result must be combined with clinical  observations, patient history, and epidemiological information. If result is POSITIVE SARS-CoV-2 target nucleic acids are DETECTED. The SARS-CoV-2 RNA is generally detectable in upper and lower  respiratory specimens dur ing the acute phase of infection.  Positive  results are indicative of active infection with SARS-CoV-2.  Clinical  correlation with patient history and other diagnostic information is  necessary to determine patient infection status.  Positive results do  not rule out bacterial infection or co-infection with other viruses. If result is PRESUMPTIVE POSTIVE SARS-CoV-2 nucleic acids MAY BE PRESENT.   A presumptive positive result was obtained on the submitted specimen  and confirmed on repeat testing.  While 2019 novel coronavirus  (SARS-CoV-2) nucleic acids may be present in the submitted sample  additional confirmatory testing may be necessary for epidemiological  and / or clinical management purposes  to differentiate between  SARS-CoV-2 and other Sarbecovirus currently known to infect humans.  If clinically indicated additional testing with an alternate test  methodology [phone]) is advised. The SARS-CoV-2 RNA is generally  detectable in upper and lower respiratory sp ecimens during the acute  phase of infection. The expected result is  Negative. Fact Sheet for Patients:  StrictlyIdeas.no Fact Sheet for Healthcare Providers: BankingDealers.co.za This test is not yet approved or cleared by the [place] FDA and has been authorized for detection and/or diagnosis of SARS-CoV-2 by FDA under an Emergency Use Authorization (EUA).  This EUA will remain in effect (meaning this test can be used) for the duration of the COVID-19 declaration under Section 564(b)(1) of the Act, 21 U.S.C. section 360bbb-3(b)(1), unless the authorization is terminated or revoked sooner. Performed at [facility], [place] [address] [place],  [place] [address]    Dg Chest 2 View  Result Date: 09/23/2018 CLINICAL DATA:  Left chest pain.  Shortness of breath. EXAM: CHEST - 2 VIEW COMPARISON:  November 13, 2017 FINDINGS: The heart size and mediastinal contours are within normal limits. Both lungs are clear. The visualized skeletal structures are unremarkable. IMPRESSION: No active cardiopulmonary disease. Electronically Signed   By: Dorise Bullion III M.D   On: 09/23/2018 17:42    Pending Labs Unresulted Labs (From admission, onward)    Start     Ordered   Signed and Held  Hemoglobin A1c  Tomorrow morning,   R    Comments:  To assess prior glycemic control    Signed and Held   Signed and Held  Magnesium  Tomorrow morning,   R    Comments:  Call MD if <1.5    Signed and Held   Signed and Held  Phosphorus  Tomorrow morning,   R     Signed and Held   Signed and Held  TSH  Once,   R    Comments:  Cancel if already done within 1 month and notify MD    Signed and Held   Signed and Held  Comprehensive metabolic panel  Once,   R    Comments:  Cal MD for K<3.5 or >5.0    Signed and Held   Signed and Held  CBC  Once,   R    Comments:  Call for hg <8.0    Signed and Held          Vitals/Pain Today's Vitals   09/23/18 2158 09/23/18 2200 09/23/18 2230 09/23/18 2300  BP:  139/77  139/83 140/79  Pulse:  66 66 67  Resp:  18 15 16   SpO2:  94% 97% 96%  Weight:      Height:      PainSc: 1        Isolation Precautions No active isolations  Medications Medications  sodium chloride flush (NS) 0.9 % injection 3 mL (3 mLs Intravenous Not Given 09/23/18 2206)  aspirin chewable tablet 324 mg (324 mg Oral Given 09/23/18 1924)  morphine 4 MG/ML injection 4 mg (4 mg Intravenous Given 09/23/18 1939)    Mobility walks

## 2018-09-24 NOTE — Consult Note (Signed)
Cardiology Consult    Patient ID: Dean Brown MRN: [id number], DOB/AGE: 12-17-1945   Admit date: 09/23/2018 Date of Consult: 09/24/2018  Primary Physician: Dean Derry, PA-C Primary Cardiologist: Dean Brown [facility] Requesting Provider: Toy Baker, MD  Patient Profile    Dean Brown is a 73 y.o. male with a history of hypertension, hyperlipidemia, type 2 diabetes mellitus, palpitations, GERD, and prostate cancer, who is being seen today for the evaluation of chest pain at the request of Dr. Roel Brown.  History of Present Illness    Dean Brown is a 73 year old male with the above history who is followed by Dr. Jobe Brown at [facility]. Per chart review in Care Everywhere, patient was told many years ago while in the [employer] that he had an abnormal heart rhythm. Prior notes reportedly mention evidence of premature junctional contraction felt to be secondary to caffeine. Patient had a 30-day monitor in 2018 that showed 4 beats of VT and 5 beats of atrial tachycardia. Therefore, home Toprol-XL was increased to 50mg  daily. Patient was seen in 05/2017 at which time he reported chest pain. A stress echocardiogram was ordered which was normal and patient was able to reach 10.10 METS during this. Patient was last seen by Dr. Jobe Brown on 06/19/2018 at which time patient was doing well. He denied any chest pain, shortness of breath, or palpitations at that time.   Patient presented to the ED yesterday for evaluation of chest pain and shortness of breath.  Patient reports chest pain for the past 2 to 3 days.  He states he woke up on Sunday with left-sided chest pressure/soreness weeks as a 5/10 on the pain scale.  Pain persisted all day Sunday and into Monday morning so patient decided to come to the ED for further evaluation. Patient reports chest pain may have been slightly worse with activity but was very vague about this. He reports associated shortness of breath and reflux-like symptoms as  well as some lightheadedness/dizziness which he thinks may be his vertigo. No associated palpitations, nausea, vomiting, syncope. Patient is unsure if he has had any other recent chest pain. Patient states he has a lot of arthritis and has aches/pains everywhere so it can be hard to distinguish where the pain is coming from. Patient has sleep apnea and uses a CPAP machine.  He denies any orthopnea or PND when he is compliant with CPAP. He has occasional lower extremity swelling.  He denies any recent fevers or illnesses.  He notes occasional nasal drainage and cough for the coronavirus.  In the ED, patient mildly hypertensive but vitals stable. EKG showed normal sinus rhythm with RBBB and T wave inversions in leads III and V3 (possibly secondary to RBBB). Initial troponin negative. Chest x-ray showed no acute findings. BNP normal. CBC unremarkable. Na 141, K 3.6, Glucose 113, SCr 0.95. COVID-19 testing negative.   Patient received IV morphine in the ED with complete resolution of pain. He was admitted for ACS rule out.  At the time of this evaluation, patient denies any chest pain.  Patient has a remote smoking history but quit in 1995. He reports occasional alcohol use and denies any drug use. Patient does have a family history of heart disease with his brother dying from a heart attack at the age of 35 and his paternal grandmother and grandfather also having heart attack but during old age.   Past Medical History   Past Medical History:  Diagnosis Date  . Arthritis   .  Cancer [facility])    Prostate cancer  . Diabetes mellitus    type 2  . GERD (gastroesophageal reflux disease)   . Hypertension   . Pneumonia 12/03/2013  . Sleep apnea    cpap sleep study - [facility]    Past Surgical History:  Procedure Laterality Date  . KNEE SURGERY Right 10/2012  . LUMBAR LAMINECTOMY/DECOMPRESSION MICRODISCECTOMY  11/01/2011   Procedure: LUMBAR LAMINECTOMY/DECOMPRESSION MICRODISCECTOMY;  Surgeon: Dean Ship, MD;  Location: [facility];  Service: Orthopedics;  Laterality: Left;  Left sided lumbar 5- sacrum 1 microdisectomy  . microdiskectomy  11/01/2011  . PROSTATE SURGERY     s/p cancer  . ROTATOR CUFF REPAIR     left shoulder     Allergies  Allergies  Allergen Reactions  . No Known Allergies     Inpatient Medications    . amLODipine  10 mg Oral Daily  . aspirin  81 mg Oral Daily  . enoxaparin (LOVENOX) injection  40 mg Subcutaneous QHS  . insulin aspart  0-9 Units Subcutaneous TID WC  . insulin glargine  20 Units Subcutaneous QHS  . irbesartan  150 mg Oral Daily  . ketorolac  1 drop Left Eye QID  . metoprolol succinate  50 mg Oral Daily  . prednisoLONE acetate  1 drop Left Eye QID  . rosuvastatin  40 mg Oral Daily  . sodium chloride flush  3 mL Intravenous Once  . sodium chloride flush  3 mL Intravenous Q12H    Family History    Family History  Problem Relation Age of Onset  . Alzheimer's disease Mother   . Cancer Father    He indicated that his mother is deceased. He indicated that his father is deceased.   Social History    Social History   Socioeconomic History  . Marital status: Married    Spouse name: Not on file  . Number of children: Not on file  . Years of education: Not on file  . Highest education level: Not on file  Occupational History  . Not on file  Social Needs  . Financial resource strain: Not on file  . Food insecurity:    Worry: Not on file    Inability: Not on file  . Transportation needs:    Medical: Not on file    Non-medical: Not on file  Tobacco Use  . Smoking status: Former Smoker    Last attempt to quit: 05/04/1993    Years since quitting: 25.4  . Smokeless tobacco: Never Used  Substance and Sexual Activity  . Alcohol use: Yes    Comment: ocassional  . Drug use: No  . Sexual activity: Yes  Lifestyle  . Physical activity:    Days per week: Not on file    Minutes per session: Not on file  . Stress: Not on file   Relationships  . Social connections:    Talks on phone: Not on file    Gets together: Not on file    Attends religious service: Not on file    Active member of club or organization: Not on file    Attends meetings of clubs or organizations: Not on file    Relationship status: Not on file  . Intimate partner violence:    Fear of current or ex partner: Not on file    Emotionally abused: Not on file    Physically abused: Not on file    Forced sexual activity: Not on file  Other Topics Concern  .  Not on file  Social History Narrative  . Not on file     Review of Systems    Review of Systems  Constitutional: Negative for chills and fever.  HENT: Positive for congestion (occasionally). Negative for sore throat.   Respiratory: Positive for cough (ocassinally) and shortness of breath. Negative for hemoptysis.   Cardiovascular: Positive for chest pain and leg swelling (occasionally). Negative for palpitations, orthopnea and PND.  Gastrointestinal: Negative for blood in stool, nausea and vomiting.  Genitourinary: Negative for hematuria.  Musculoskeletal: Positive for joint pain. Negative for falls.  Neurological: Positive for dizziness. Negative for loss of consciousness.  Endo/Heme/Allergies: Does not bruise/bleed easily.  Psychiatric/Behavioral: Negative for substance abuse.    Physical Exam    Blood pressure 123/79, pulse 62, temperature 97.7 F (36.5 C), temperature source Oral, resp. rate 17, height 5\' 5"  (1.651 m), weight 78.5 kg, SpO2 97 %.  General: 73 y.o. overweight African-American male resting comfortably in no acute distress. Pleasant and cooperative. HEENT: Normal  Neck: Supple. No carotid bruits. Lungs: No increased work of breathing. Clear to auscultation bilaterally. No wheezes, rhonchi, or rales. Heart: RRR. Distinct S1 and S2. No murmurs, gallops, or rubs. No chest wall tenderness. Abdomen: Soft, non-distended, and non-tender to palpation. Bowel sounds present.  Extremities: No lower extremity edema. Radial pulses 2+ and equal bilaterally. Skin: Warm and dry. Neuro: Alert and oriented x3. No focal deficits. Moves all extremities spontaneously. Psych: Normal affect. Responds appropriately.  Labs    Troponin (Point of Care Test) No results for input(s): TROPIPOC in the last 72 hours. Recent Labs    09/23/18 1928 09/24/18 0209 09/24/18 0828  TROPONINI <0.03 <0.03 <0.03   Lab Results  Component Value Date   WBC 7.3 09/24/2018   HGB 12.2 (L) 09/24/2018   HCT 39.3 09/24/2018   MCV 98.3 09/24/2018   PLT 219 09/24/2018    Recent Labs  Lab 09/24/18 0209  NA 138  K 3.4*  CL 104  CO2 25  BUN 14  CREATININE 0.95  CALCIUM 8.4*  PROT 6.3*  BILITOT 0.3  ALKPHOS 65  ALT 27  AST 17  GLUCOSE 231*   Lab Results  Component Value Date   CHOL 106 09/24/2018   HDL 37 (L) 09/24/2018   LDLCALC 49 09/24/2018   TRIG 100 09/24/2018   No results found for: [facility]   Radiology Studies    Dg Chest 2 View  Result Date: 09/23/2018 CLINICAL DATA:  Left chest pain.  Shortness of breath. EXAM: CHEST - 2 VIEW COMPARISON:  November 13, 2017 FINDINGS: The heart size and mediastinal contours are within normal limits. Both lungs are clear. The visualized skeletal structures are unremarkable. IMPRESSION: No active cardiopulmonary disease. Electronically Signed   By: Dorise Bullion III M.D   On: 09/23/2018 17:42    EKG     EKG: EKG was personally reviewed and demonstrates: Normal sinus rhythm, rate 77 bpm, with RBBB, LAFB, LVH, and diffuse T wave inversion in leads III, aVF, and V1 through V5.  Telemetry: Telemetry was personally reviewed and demonstrates: Sinus arrhythmias or normal sinus rhythm with frequent PACs (in bigeminy pattern) with rate in the 60's to 70's.   Cardiac Imaging    Echo Stress Test 07/02/2017: NORMAL STRESS TEST. NORMAL RESTING STUDY WITH NO WALL MOTION ABNORMALITIES AT REST AND PEAK STRESS. VALVULAR REGURGITATION: TRIVIAL MR,  TRIVIAL PR, TRIVIAL TR NO VALVULAR STENOSIS Maximum workload of 10.10 METs was achieved during exercise. RESTING HYPERTENSION - APPROPRIATE RESPONSE _______________  Echocardiogram 09/24/2018: Impressions:  1. The left ventricle has normal systolic function with an ejection fraction of 60-65%. The cavity size was normal. Left ventricular diastolic parameters were normal. No evidence of left ventricular regional wall motion abnormalities.  2. The right ventricle has normal systolic function. The cavity was normal. There is no increase in right ventricular wall thickness. Right ventricular systolic pressure could not be assessed.   Assessment & Plan    Chest Pain - Patient presented with left-sided chest pain that have been constant for the past day.  Patient has no known history of CAD and had a normal echo stress test in 06/2017. - EKG shows sinus rhythm with diffuse T wave inversion in leads III, aVF, and V1 through V5 in the setting of RBBB and LVH. -Troponin negative x3. - Echo this admission showed LVEF of 60 to 65% with normal diastolic parameters and no evidence of any regional wall motion abnormalities. - Patient currently chest pain-free after receiving IV morphine in the ED. - Continue home aspirin, beta-blocker, and statin. - Chest pain somewhat atypical. Will discuss possible Myoview with MD. But given patient chest pain at rest for >24 hours on presentation with negative enzymes, may be able to have patient follow-up up with primary Cardiologist as outpatient.  Hypertension - Most recent BP 123/70. - Continue current medications.   Hyperlipidemia - Lipid panel this admission: Cholesterol 106, Triglycerides 100, HDL 37, LDL 49. - Continue home statin.  Type 2 Diabetes Mellitus - Management per primary team.  Otherwise, per primary team.   Signed, Darreld Mclean, PA-C 09/24/2018, 11:31 AM Pager: [phone] For questions or updates, please contact   Please consult  [email or web address] for contact info under Cardiology/STEMI.

## 2018-10-08 ENCOUNTER — Other Ambulatory Visit: Payer: Self-pay

## 2018-10-08 ENCOUNTER — Ambulatory Visit (INDEPENDENT_AMBULATORY_CARE_PROVIDER_SITE_OTHER): Payer: Medicare Other | Admitting: Podiatry

## 2018-10-08 ENCOUNTER — Encounter: Payer: Self-pay | Admitting: Podiatry

## 2018-10-08 VITALS — Temp 97.5°F

## 2018-10-08 DIAGNOSIS — M79674 Pain in right toe(s): Secondary | ICD-10-CM | POA: Diagnosis not present

## 2018-10-08 DIAGNOSIS — M79675 Pain in left toe(s): Secondary | ICD-10-CM | POA: Diagnosis not present

## 2018-10-08 DIAGNOSIS — L84 Corns and callosities: Secondary | ICD-10-CM

## 2018-10-08 DIAGNOSIS — B351 Tinea unguium: Secondary | ICD-10-CM

## 2018-10-08 DIAGNOSIS — E119 Type 2 diabetes mellitus without complications: Secondary | ICD-10-CM | POA: Diagnosis not present

## 2018-10-08 DIAGNOSIS — Z794 Long term (current) use of insulin: Secondary | ICD-10-CM

## 2018-10-08 NOTE — Patient Instructions (Signed)

## 2018-10-21 NOTE — Progress Notes (Signed)
Subjective: Dean Brown is a 73 y.o. y.o. male who presents for preventative diabetic foot care ontoday with cc of painful, discolored, thick toenails, corns and calluses which interfere with daily activities. Pain is aggravated when wearing enclosed shoe gear and relieved with periodic professional debridement.  Whiteheart, Dyann Kief, PA-C is his PCP. Last visit was 10/12/2018.  Current Outpatient Medications:  .  amLODipine (NORVASC) 10 MG tablet, Take 10 mg by mouth daily., Disp: , Rfl:  .  aspirin 81 MG chewable tablet, Chew 81 mg by mouth daily., Disp: , Rfl:  .  atorvastatin (LIPITOR) 80 MG tablet, Take 80 mg by mouth daily., Disp: , Rfl:  .  azelastine (ASTELIN) 137 MCG/SPRAY nasal spray, Place 1 spray into both nostrils 2 (two) times daily as needed for rhinitis. Use in each nostril as directed, Disp: , Rfl:  .  buPROPion (WELLBUTRIN XL) 150 MG 24 hr tablet, Take 150 mg by mouth daily. , Disp: , Rfl:  .  candesartan (ATACAND) 8 MG tablet, Take 8 mg by mouth daily. , Disp: , Rfl:  .  celecoxib (CELEBREX) 200 MG capsule, Take 200 mg by mouth 2 (two) times daily., Disp: , Rfl:  .  DULoxetine (CYMBALTA) 60 MG capsule, Take 60 mg by mouth daily. , Disp: , Rfl:  .  furosemide (LASIX) 20 MG tablet, Take 20 mg by mouth daily as needed for edema., Disp: , Rfl:  .  gabapentin (NEURONTIN) 300 MG capsule, Take 300 mg by mouth 4 (four) times daily. , Disp: , Rfl:  .  glucagon (GLUCAGEN) 1 MG SOLR, Inject 1 mg into the vein once as needed (severely low blood sugar)., Disp: , Rfl:  .  hydrochlorothiazide (HYDRODIURIL) 25 MG tablet, Take 25 mg by mouth daily. , Disp: , Rfl:  .  LANTUS SOLOSTAR 100 UNIT/ML Solostar Pen, Inject 30 Units into the skin at bedtime. , Disp: , Rfl:  .  losartan (COZAAR) 100 MG tablet, Take 100 mg by mouth daily. , Disp: , Rfl:  .  metFORMIN (GLUCOPHAGE-XR) 500 MG 24 hr tablet, Take 500 mg by mouth daily with breakfast., Disp: , Rfl:  .  methocarbamol (ROBAXIN) 500 MG  tablet, Take 1 tablet (500 mg total) by mouth daily., Disp: , Rfl:  .  metoprolol succinate (TOPROL-XL) 50 MG 24 hr tablet, Take 50 mg by mouth daily. , Disp: , Rfl:  .  MODAFINIL PO, Take 200 mg by mouth daily., Disp: , Rfl:  .  naproxen (NAPROSYN) 500 MG tablet, Take 500 mg by mouth 2 (two) times daily as needed. , Disp: , Rfl:  .  omeprazole (PRILOSEC) 20 MG capsule, Take 20 mg by mouth daily., Disp: , Rfl:  .  OZEMPIC, 1 MG/DOSE, 2 MG/1.5ML SOPN, Inject 1 mg into the skin once a week. , Disp: , Rfl:  .  rosuvastatin (CRESTOR) 40 MG tablet, Take 40 mg by mouth daily. , Disp: , Rfl:  .  zolpidem (AMBIEN) 10 MG tablet, Take 10 mg by mouth at bedtime as needed for sleep., Disp: , Rfl:   Allergies  Allergen Reactions  . No Known Allergies     Objective: Vitals:   10/08/18 1614  Temp: (!) 97.5 F (36.4 C)    Vascular Examination: Capillary refill time immediate x 10 digits.  Dorsalis pedis pulses 1/4 b/l.  Posterior tibial pulses 1/4 b/l.  Digital hair present x 10 digits.  Skin temperature gradient WNL b/l.  Dermatological Examination: Skin with normal turgor, texture and tone  b/l.  Toenails 1-5 b/l discolored, thick, dystrophic with subungual debris and pain with palpation to nailbeds due to thickness of nails.  Hyperkeratotic lesion medial hallux IPJ b/l and dorsal 5th PIPJ right foot. No erythema, no edema, no drainage, no flocculence noted.   Porokeratotic lesions submet with tenderness to palpation. No erythema, no edema, no drainage, no flocculence.   Musculoskeletal: Muscle strength 5/5 to all LE muscle groups.  Neurological: Sensation intact with 10 gram monofilament.  Vibratory sensation intact b/l.  Assessment: 1. Painful onychomycosis toenails 1-5 b/l 2.  Callus b/l hallux 3.  Corn right 5th digit 4.  NIDDM  Plan: 1. Continue diabetic foot care principles. Literature dispensed on today. 2. Toenails 1-5 b/l were debrided in length and girth without  iatrogenic bleeding. 3. Hyperkeratotic lesion(s) b/l great toes and right 5th digit pared with sterile scalpel blade without incident. 4. Patient to continue soft, supportive shoe gear daily. 5. Patient to report any pedal injuries to medical professional immediately. 6. Follow up 3 months.  7. Patient/POA to call should there be a concern in the interim.

## 2018-11-11 ENCOUNTER — Ambulatory Visit (INDEPENDENT_AMBULATORY_CARE_PROVIDER_SITE_OTHER): Payer: Medicare Other | Admitting: Orthotics

## 2018-11-11 ENCOUNTER — Other Ambulatory Visit: Payer: Self-pay

## 2018-11-11 DIAGNOSIS — M2141 Flat foot [pes planus] (acquired), right foot: Secondary | ICD-10-CM | POA: Diagnosis not present

## 2018-11-11 DIAGNOSIS — M2142 Flat foot [pes planus] (acquired), left foot: Secondary | ICD-10-CM

## 2018-11-11 DIAGNOSIS — L84 Corns and callosities: Secondary | ICD-10-CM

## 2018-11-11 DIAGNOSIS — M2021 Hallux rigidus, right foot: Secondary | ICD-10-CM

## 2018-11-11 DIAGNOSIS — E119 Type 2 diabetes mellitus without complications: Secondary | ICD-10-CM

## 2018-11-11 DIAGNOSIS — Z794 Long term (current) use of insulin: Secondary | ICD-10-CM

## 2018-11-11 DIAGNOSIS — M2022 Hallux rigidus, left foot: Secondary | ICD-10-CM | POA: Diagnosis not present

## 2018-11-11 DIAGNOSIS — M79671 Pain in right foot: Secondary | ICD-10-CM

## 2018-11-11 NOTE — Progress Notes (Signed)

## 2018-12-02 ENCOUNTER — Other Ambulatory Visit: Payer: Self-pay

## 2018-12-02 ENCOUNTER — Encounter (HOSPITAL_COMMUNITY): Payer: Self-pay

## 2018-12-02 ENCOUNTER — Ambulatory Visit (HOSPITAL_COMMUNITY)
Admission: EM | Admit: 2018-12-02 | Discharge: 2018-12-02 | Disposition: A | Discharge status: Home / Self Care | Payer: Medicare Other | Attending: Family Medicine | Admitting: Family Medicine

## 2018-12-02 DIAGNOSIS — Z20828 Contact with and (suspected) exposure to other viral communicable diseases: Secondary | ICD-10-CM | POA: Diagnosis present

## 2018-12-02 DIAGNOSIS — Z20822 Contact with and (suspected) exposure to covid-19: Secondary | ICD-10-CM

## 2018-12-02 NOTE — ED Triage Notes (Signed)
Pt presents for Covid Test after an exposure.

## 2018-12-02 NOTE — Discharge Instructions (Addendum)
Person Under Monitoring Name: Dean Brown  Location: [place] [address]   Infection Prevention Recommendations for Individuals Confirmed to have, or Being Evaluated for, 2019 Novel Coronavirus (COVID-19) Infection Who Receive Care at Home  Individuals who are confirmed to have, or are being evaluated for, COVID-19 should follow the prevention steps below until a healthcare provider or local or state health department says they can return to normal activities.  Stay home except to get medical care You should restrict activities outside your home, except for getting medical care. Do not go to work, school, or public areas, and do not use public transportation or taxis.  Call ahead before visiting your doctor Before your medical appointment, call the healthcare provider and tell them that you have, or are being evaluated for, COVID-19 infection. This will help the healthcare providers office take steps to keep other people from getting infected. Ask your healthcare provider to call the local or state health department.  Monitor your symptoms Seek prompt medical attention if your illness is worsening (e.g., difficulty breathing). Before going to your medical appointment, call the healthcare provider and tell them that you have, or are being evaluated for, COVID-19 infection. Ask your healthcare provider to call the local or state health department.  Wear a facemask You should wear a facemask that covers your nose and mouth when you are in the same room with other people and when you visit a healthcare provider. People who live with or visit you should also wear a facemask while they are in the same room with you.  Separate yourself from other people in your home As much as possible, you should stay in a different room from other people in your home. Also, you should use a separate bathroom, if available.  Avoid sharing household items You should not  share dishes, drinking glasses, cups, eating utensils, towels, bedding, or other items with other people in your home. After using these items, you should wash them thoroughly with soap and water.  Cover your coughs and sneezes Cover your mouth and nose with a tissue when you cough or sneeze, or you can cough or sneeze into your sleeve. Throw used tissues in a lined trash can, and immediately wash your hands with soap and water for at least 20 seconds or use an alcohol-based hand rub.  Wash your [facility] your hands often and thoroughly with soap and water for at least 20 seconds. You can use an alcohol-based hand sanitizer if soap and water are not available and if your hands are not visibly dirty. Avoid touching your eyes, nose, and mouth with unwashed hands.   Prevention Steps for Caregivers and Household Members of Individuals Confirmed to have, or Being Evaluated for, COVID-19 Infection Being Cared for in the Home  If you live with, or provide care at home for, a person confirmed to have, or being evaluated for, COVID-19 infection please follow these guidelines to prevent infection:  Follow healthcare providers instructions Make sure that you understand and can help the patient follow any healthcare provider instructions for all care.  Provide for the patients basic needs You should help the patient with basic needs in the home and provide support for getting groceries, prescriptions, and other personal needs.  Monitor the patients symptoms If they are getting sicker, call his or her medical provider and tell them that the patient has, or is being evaluated for, COVID-19 infection. This will help the healthcare providers office  take steps to keep other people from getting infected. Ask the healthcare provider to call the local or state health department.  Limit the number of people who have contact with the patient If possible, have only one caregiver for the  patient. Other household members should stay in another home or place of residence. If this is not possible, they should stay in another room, or be separated from the patient as much as possible. Use a separate bathroom, if available. Restrict visitors who do not have an essential need to be in the home.  Keep older adults, very young children, and other sick people away from the patient Keep older adults, very young children, and those who have compromised immune systems or chronic health conditions away from the patient. This includes people with chronic heart, lung, or kidney conditions, diabetes, and cancer.  Ensure good ventilation Make sure that shared spaces in the home have good air flow, such as from an air conditioner or an opened window, weather permitting.  Wash your hands often Wash your hands often and thoroughly with soap and water for at least 20 seconds. You can use an alcohol based hand sanitizer if soap and water are not available and if your hands are not visibly dirty. Avoid touching your eyes, nose, and mouth with unwashed hands. Use disposable paper towels to dry your hands. If not available, use dedicated cloth towels and replace them when they become wet.  Wear a facemask and gloves Wear a disposable facemask at all times in the room and gloves when you touch or have contact with the patients blood, body fluids, and/or secretions or excretions, such as sweat, saliva, sputum, nasal mucus, vomit, urine, or feces.  Ensure the mask fits over your nose and mouth tightly, and do not touch it during use. Throw out disposable facemasks and gloves after using them. Do not reuse. Wash your hands immediately after removing your facemask and gloves. If your personal clothing becomes contaminated, carefully remove clothing and launder. Wash your hands after handling contaminated clothing. Place all used disposable facemasks, gloves, and other waste in a lined container before  disposing them with other household waste. Remove gloves and wash your hands immediately after handling these items.  Do not share dishes, glasses, or other household items with the patient Avoid sharing household items. You should not share dishes, drinking glasses, cups, eating utensils, towels, bedding, or other items with a patient who is confirmed to have, or being evaluated for, COVID-19 infection. After the person uses these items, you should wash them thoroughly with soap and water.  Wash laundry thoroughly Immediately remove and wash clothes or bedding that have blood, body fluids, and/or secretions or excretions, such as sweat, saliva, sputum, nasal mucus, vomit, urine, or feces, on them. Wear gloves when handling laundry from the patient. Read and follow directions on labels of laundry or clothing items and detergent. In general, wash and dry with the warmest temperatures recommended on the label.  Clean all areas the individual has used often Clean all touchable surfaces, such as counters, tabletops, doorknobs, bathroom fixtures, toilets, phones, keyboards, tablets, and bedside tables, every day. Also, clean any surfaces that may have blood, body fluids, and/or secretions or excretions on them. Wear gloves when cleaning surfaces the patient has come in contact with. Use a diluted bleach solution (e.g., dilute bleach with 1 part bleach and 10 parts water) or a household disinfectant with a label that says EPA-registered for coronaviruses. To make a bleach  solution at home, add 1 tablespoon of bleach to 1 quart (4 cups) of water. For a larger supply, add  cup of bleach to 1 gallon (16 cups) of water. Read labels of cleaning products and follow recommendations provided on product labels. Labels contain instructions for safe and effective use of the cleaning product including precautions you should take when applying the product, such as wearing gloves or eye protection and making sure you  have good ventilation during use of the product. Remove gloves and wash hands immediately after cleaning.  Monitor yourself for signs and symptoms of illness Caregivers and household members are considered close contacts, should monitor their health, and will be asked to limit movement outside of the home to the extent possible. Follow the monitoring steps for close contacts listed on the symptom monitoring form.   ? If you have additional questions, contact your local health department or call the epidemiologist on call at [phone] (available 24/7). ? This guidance is subject to change. For the most up-to-date guidance from [facility], please refer to their website: [email or web address]

## 2018-12-02 NOTE — ED Provider Notes (Signed)
[facility]    CSN: [id number] Arrival date & time: 12/02/18  1101     History   Chief Complaint Chief Complaint  Patient presents with  . Covid Test    HPI Dean Brown is a 73 y.o. male.   HPI  Patient is here for COVID-19 testing.  He has no symptoms.  No fever chills, no body aches, no respiratory symptoms.  He is here with his wife.  They had a nephew visit a day or 2 ago.  The day after his visit the nephew's mother was hospitalized with COVID.  They know that he was exposed to [employer], and are concerned that they may be exposed also.  Past Medical History:  Diagnosis Date  . Arthritis   . Cancer [facility])    Prostate cancer  . Diabetes mellitus    type 2  . GERD (gastroesophageal reflux disease)   . Hypertension   . Pneumonia 12/03/2013  . Sleep apnea    cpap sleep study - [facility]    Patient Active Problem List   Diagnosis Date Noted  . DM2 (diabetes mellitus, type 2) ([place]) 09/24/2018  . Essential hypertension 09/24/2018  . Hypokalemia 09/24/2018  . Chest pain 09/23/2018  . Pseudophakia of right eye 09/17/2018  . Rotator cuff syndrome of right shoulder 06/21/2018  . History of prostate cancer 12/04/2017  . Insomnia 09/12/2017  . ED (erectile dysfunction) 01/23/2017  . 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 Gross hematuria 11/16/2014  . 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 SIRS (systemic inflammatory response syndrome) ([place]) 12/03/2013  . Acid reflux 12/03/2013  . BP (high blood pressure) 12/03/2013  . Arthritis, degenerative 12/03/2013  . History of penile implant 06/16/2013  . HLD (hyperlipidemia) 03/08/2012  . Obstructive apnea 08/04/2011  . 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 ACHILLES TENDINITIS 11/10/2008  . PES PLANUS 11/10/2008  . HALLUX VALGUS 11/10/2008    Past Surgical History:  Procedure Laterality Date  . KNEE SURGERY Right 10/2012  . LUMBAR LAMINECTOMY/DECOMPRESSION  MICRODISCECTOMY  11/01/2011   Procedure: LUMBAR LAMINECTOMY/DECOMPRESSION MICRODISCECTOMY;  Surgeon: Sinclair Ship, MD;  Location: [place];  Service: Orthopedics;  Laterality: Left;  Left sided lumbar 5- sacrum 1 microdisectomy  . microdiskectomy  11/01/2011  . PROSTATE SURGERY     s/p cancer  . ROTATOR CUFF REPAIR     left shoulder       Home Medications    Prior to Admission medications   Medication Sig Start Date End Date Taking? Authorizing Provider  amLODipine (NORVASC) 10 MG tablet Take 10 mg by mouth daily.    [provider]  aspirin 81 MG chewable tablet Chew 81 mg by mouth daily.    [provider]  atorvastatin (LIPITOR) 80 MG tablet Take 80 mg by mouth daily.    [provider]  azelastine (ASTELIN) 137 MCG/SPRAY nasal spray Place 1 spray into both nostrils 2 (two) times daily as needed for rhinitis. Use in each nostril as directed    [provider]  buPROPion (WELLBUTRIN XL) 150 MG 24 hr tablet Take 150 mg by mouth daily.  07/31/18   [provider]  candesartan (ATACAND) 8 MG tablet Take 8 mg by mouth daily.  06/20/18   [provider]  celecoxib (CELEBREX) 200 MG capsule Take 200 mg by mouth 2 (two) times daily.    [provider]  DULoxetine (CYMBALTA) 60 MG capsule Take 60 mg by mouth daily.  07/31/18   [provider]  furosemide (LASIX) 20 MG tablet Take 20 mg by mouth daily as needed for edema.    [provider]  gabapentin (NEURONTIN) 300 MG capsule Take 300 mg by mouth 4 (four) times daily.  01/19/16   [provider]  glucagon (GLUCAGEN) 1 MG SOLR Inject 1 mg into the vein once as needed (severely low blood sugar).    [provider]  hydrochlorothiazide (HYDRODIURIL) 25 MG tablet Take 25 mg by mouth daily.  05/21/18   [provider]  LANTUS SOLOSTAR 100 UNIT/ML Solostar Pen Inject 30 Units into the skin at bedtime.  09/11/18   [provider]   losartan (COZAAR) 100 MG tablet Take 100 mg by mouth daily.  05/30/18   [provider]  metFORMIN (GLUCOPHAGE-XR) 500 MG 24 hr tablet Take 500 mg by mouth daily with breakfast.    [provider]  methocarbamol (ROBAXIN) 500 MG tablet Take 1 tablet (500 mg total) by mouth daily. 09/24/18   Rama, Venetia Maxon, MD  metoprolol succinate (TOPROL-XL) 50 MG 24 hr tablet Take 50 mg by mouth daily.  05/21/18   [provider]  MODAFINIL PO Take 200 mg by mouth daily.    [provider]  naproxen (NAPROSYN) 500 MG tablet Take 500 mg by mouth 2 (two) times daily as needed.     [provider]  omeprazole (PRILOSEC) 20 MG capsule Take 20 mg by mouth daily.    [provider]  OZEMPIC, 1 MG/DOSE, 2 MG/1.5ML SOPN Inject 1 mg into the skin once a week.  07/08/18   [provider]  rosuvastatin (CRESTOR) 40 MG tablet Take 40 mg by mouth daily.  06/20/18   [provider]  zolpidem (AMBIEN) 10 MG tablet Take 10 mg by mouth at bedtime as needed for sleep.    [provider]    Family History Family History  Problem Relation Age of Onset  . Alzheimer's disease Mother   . Cancer Father     Social History Social History   Tobacco Use  . Smoking status: Former Smoker    Quit date: 05/04/1993    Years since quitting: 25.5  . Smokeless tobacco: Never Used  Substance Use Topics  . Alcohol use: Yes    Comment: ocassional  . Drug use: No     Allergies   No known allergies   Review of Systems Review of Systems  Constitutional: Negative for chills and fever.  HENT: Negative for ear pain and sore throat.   Eyes: Negative for pain and visual disturbance.  Respiratory: Negative for cough and shortness of breath.   Cardiovascular: Negative for chest pain and palpitations.  Gastrointestinal: Negative for abdominal pain and vomiting.  Genitourinary: Negative for dysuria and hematuria.  Musculoskeletal: Negative for arthralgias and  back pain.  Skin: Negative for color change and rash.  Neurological: Negative for seizures and syncope.  All other systems reviewed and are negative.    Physical Exam  Updated Vital Signs BP 127/80 (BP Location: Left Arm)   Pulse 77   Temp 98.2 F (36.8 C) (Oral)   Resp 17   SpO2 97%       Physical Exam Constitutional:      General: He is not in acute distress.    Appearance: He is well-developed.  HENT:     Head: Normocephalic and atraumatic.  Eyes:     Conjunctiva/sclera: Conjunctivae normal.     Pupils: Pupils are  equal, round, and reactive to light.  Neck:     Musculoskeletal: Normal range of motion.  Cardiovascular:     Rate and Rhythm: Normal rate.  Pulmonary:     Effort: Pulmonary effort is normal. No respiratory distress.  Abdominal:     General: There is no distension.     Palpations: Abdomen is soft.  Musculoskeletal: Normal range of motion.  Skin:    General: Skin is warm and dry.  Neurological:     Mental Status: He is alert.      UC Treatments / Results  Labs (all labs ordered are listed, but only abnormal results are displayed) Labs Reviewed  NOVEL CORONAVIRUS, NAA (HOSPITAL ORDER, SEND-OUT TO REF LAB)    EKG   Radiology No results found.  Procedures Procedures (including critical care time)  Medications Ordered in UC Medications - No data to display  Initial Impression / Assessment and Plan / UC Course  I have reviewed the triage vital signs and the nursing notes.  Pertinent labs & imaging results that were available during my care of the patient were reviewed by me and considered in my medical decision making (see chart for details).     Discussed exposure to COVID-19.  Symptoms to watch for.  The importance of taking Tylenol for pain or fever.  Return if symptoms worsen.  Discussed the fact that with coexisting illnesses he is at high risk.  Should be seen promptly for any significant shortness of breath, weakness, dizziness.   Emphasized the need to stay home until COVID-19 test result is available Final Clinical Impressions(s) / UC Diagnoses   Final diagnoses:  Exposure to Covid-19 Virus     Discharge Instructions        Person Under Monitoring Name: Dean Brown  Location: [place] [address]   Infection Prevention Recommendations for Individuals Confirmed to have, or Being Evaluated for, 2019 Novel Coronavirus (COVID-19) Infection Who Receive Care at Home  Individuals who are confirmed to have, or are being evaluated for, COVID-19 should follow the prevention steps below until a healthcare provider or local or state health department says they can return to normal activities.  Stay home except to get medical care You should restrict activities outside your home, except for getting medical care. Do not go to work, school, or public areas, and do not use public transportation or taxis.  Call ahead before visiting your doctor Before your medical appointment, call the healthcare provider and tell them that you have, or are being evaluated for, COVID-19 infection. This will help the healthcare provider's office take steps to keep other people from getting infected. Ask your healthcare provider to call the local or state health department.  Monitor your symptoms Seek prompt medical attention if your illness is worsening (e.g., difficulty breathing). Before going to your medical appointment, call the healthcare provider and tell them that you have, or are being evaluated for, COVID-19 infection. Ask your healthcare provider to call the local or state health department.  Wear a facemask You should wear a facemask that covers your nose and mouth when you are in the same room with other people and when you visit a healthcare provider. People who live with or visit you should also wear a facemask while they are in the same room with you.  Separate yourself from other people in  your home As much as possible, you should stay in a different room from other people in your home. Also, you  should use a separate bathroom, if available.  Avoid sharing household items You should not share dishes, drinking glasses, cups, eating utensils, towels, bedding, or other items with other people in your home. After using these items, you should wash them thoroughly with soap and water.  Cover your coughs and sneezes Cover your mouth and nose with a tissue when you cough or sneeze, or you can cough or sneeze into your sleeve. Throw used tissues in a lined trash can, and immediately wash your hands with soap and water for at least 20 seconds or use an alcohol-based hand rub.  Wash your [facility] your hands often and thoroughly with soap and water for at least 20 seconds. You can use an alcohol-based hand sanitizer if soap and water are not available and if your hands are not visibly dirty. Avoid touching your eyes, nose, and mouth with unwashed hands.   Prevention Steps for Caregivers and Household Members of Individuals Confirmed to have, or Being Evaluated for, COVID-19 Infection Being Cared for in the Home  If you live with, or provide care at home for, a person confirmed to have, or being evaluated for, COVID-19 infection please follow these guidelines to prevent infection:  Follow healthcare provider's instructions Make sure that you understand and can help the patient follow any healthcare provider instructions for all care.  Provide for the patient's basic needs You should help the patient with basic needs in the home and provide support for getting groceries, prescriptions, and other personal needs.  Monitor the patient's symptoms If they are getting sicker, call his or her medical provider and tell them that the patient has, or is being evaluated for, COVID-19 infection. This will help the healthcare provider's office take steps to keep other people from getting  infected. Ask the healthcare provider to call the local or state health department.  Limit the number of people who have contact with the patient  If possible, have only one caregiver for the patient.  Other household members should stay in another home or place of residence. If this is not possible, they should stay  in another room, or be separated from the patient as much as possible. Use a separate bathroom, if available.  Restrict visitors who do not have an essential need to be in the home.  Keep older adults, very young children, and other sick people away from the patient Keep older adults, very young children, and those who have compromised immune systems or chronic health conditions away from the patient. This includes people with chronic heart, lung, or kidney conditions, diabetes, and cancer.  Ensure good ventilation Make sure that shared spaces in the home have good air flow, such as from an air conditioner or an opened window, weather permitting.  Wash your hands often  Wash your hands often and thoroughly with soap and water for at least 20 seconds. You can use an alcohol based hand sanitizer if soap and water are not available and if your hands are not visibly dirty.  Avoid touching your eyes, nose, and mouth with unwashed hands.  Use disposable paper towels to dry your hands. If not available, use dedicated cloth towels and replace them when they become wet.  Wear a facemask and gloves  Wear a disposable facemask at all times in the room and gloves when you touch or have contact with the patient's blood, body fluids, and/or secretions or excretions, such as sweat, saliva, sputum, nasal mucus, vomit, urine, or feces.  Ensure  the mask fits over your nose and mouth tightly, and do not touch it during use.  Throw out disposable facemasks and gloves after using them. Do not reuse.  Wash your hands immediately after removing your facemask and gloves.  If your personal  clothing becomes contaminated, carefully remove clothing and launder. Wash your hands after handling contaminated clothing.  Place all used disposable facemasks, gloves, and other waste in a lined container before disposing them with other household waste.  Remove gloves and wash your hands immediately after handling these items.  Do not share dishes, glasses, or other household items with the patient  Avoid sharing household items. You should not share dishes, drinking glasses, cups, eating utensils, towels, bedding, or other items with a patient who is confirmed to have, or being evaluated for, COVID-19 infection.  After the person uses these items, you should wash them thoroughly with soap and water.  Wash laundry thoroughly  Immediately remove and wash clothes or bedding that have blood, body fluids, and/or secretions or excretions, such as sweat, saliva, sputum, nasal mucus, vomit, urine, or feces, on them.  Wear gloves when handling laundry from the patient.  Read and follow directions on labels of laundry or clothing items and detergent. In general, wash and dry with the warmest temperatures recommended on the label.  Clean all areas the individual has used often  Clean all touchable surfaces, such as counters, tabletops, doorknobs, bathroom fixtures, toilets, phones, keyboards, tablets, and bedside tables, every day. Also, clean any surfaces that may have blood, body fluids, and/or secretions or excretions on them.  Wear gloves when cleaning surfaces the patient has come in contact with.  Use a diluted bleach solution (e.g., dilute bleach with 1 part bleach and 10 parts water) or a household disinfectant with a label that says EPA-registered for coronaviruses. To make a bleach solution at home, add 1 tablespoon of bleach to 1 quart (4 cups) of water. For a larger supply, add  cup of bleach to 1 gallon (16 cups) of water.  Read labels of cleaning products and follow  recommendations provided on product labels. Labels contain instructions for safe and effective use of the cleaning product including precautions you should take when applying the product, such as wearing gloves or eye protection and making sure you have good ventilation during use of the product.  Remove gloves and wash hands immediately after cleaning.  Monitor yourself for signs and symptoms of illness Caregivers and household members are considered close contacts, should monitor their health, and will be asked to limit movement outside of the home to the extent possible. Follow the monitoring steps for close contacts listed on the symptom monitoring form.   ? If you have additional questions, contact your local health department or call the epidemiologist on call at [phone] (available 24/7). ? This guidance is subject to change. For the most up-to-date guidance from [facility], please refer to their website: YouBlogs.pl     ED Prescriptions    None     Controlled Substance Prescriptions [place] Controlled Substance Registry consulted? Not Applicable   Raylene Everts, MD 12/02/18 [phone]

## 2018-12-03 ENCOUNTER — Encounter (HOSPITAL_COMMUNITY): Payer: Self-pay

## 2018-12-03 LAB — NOVEL CORONAVIRUS, NAA (HOSP ORDER, SEND-OUT TO REF LAB; TAT 18-24 HRS): SARS-CoV-2, NAA: NOT DETECTED

## 2019-01-13 ENCOUNTER — Ambulatory Visit (INDEPENDENT_AMBULATORY_CARE_PROVIDER_SITE_OTHER): Payer: Medicare Other | Admitting: Podiatry

## 2019-01-13 ENCOUNTER — Encounter: Payer: Self-pay | Admitting: Podiatry

## 2019-01-13 ENCOUNTER — Other Ambulatory Visit: Payer: Self-pay

## 2019-01-13 ENCOUNTER — Telehealth: Payer: Self-pay | Admitting: Podiatry

## 2019-01-13 DIAGNOSIS — B351 Tinea unguium: Secondary | ICD-10-CM | POA: Diagnosis not present

## 2019-01-13 DIAGNOSIS — E119 Type 2 diabetes mellitus without complications: Secondary | ICD-10-CM | POA: Diagnosis not present

## 2019-01-13 DIAGNOSIS — M79674 Pain in right toe(s): Secondary | ICD-10-CM

## 2019-01-13 DIAGNOSIS — M79675 Pain in left toe(s): Secondary | ICD-10-CM | POA: Diagnosis not present

## 2019-01-13 DIAGNOSIS — L84 Corns and callosities: Secondary | ICD-10-CM

## 2019-01-13 MED ORDER — NONFORMULARY OR COMPOUNDED ITEM: 3 refills | Status: DC

## 2019-01-13 NOTE — Telephone Encounter (Signed)
Pt scheduled to see Liliane Channel 9.22.2020 and Liliane Channel called and pt just wants another pair of shoes like he got last time. He is cash pay and is aware of the cost. We have ordered the shoes and will call pt to pick them up and pay 150.00 when he picks them up.Marland Kitchenappointment has been canceled.

## 2019-01-13 NOTE — Patient Instructions (Signed)
Diabetes Mellitus and Foot Care Foot care is an important part of your health, especially when you have diabetes. Diabetes may cause you to have problems because of poor blood flow (circulation) to your feet and legs, which can cause your skin to:  Become thinner and drier.  Break more easily.  Heal more slowly.  Peel and crack. You may also have nerve damage (neuropathy) in your legs and feet, causing decreased feeling in them. This means that you may not notice minor injuries to your feet that could lead to more serious problems. Noticing and addressing any potential problems early is the best way to prevent future foot problems. How to care for your feet Foot hygiene  Wash your feet daily with warm water and mild soap. Do not use hot water. Then, pat your feet and the areas between your toes until they are completely dry. Do not soak your feet as this can dry your skin.  Trim your toenails straight across. Do not dig under them or around the cuticle. File the edges of your nails with an emery board or nail file.  Apply a moisturizing lotion or petroleum jelly to the skin on your feet and to dry, brittle toenails. Use lotion that does not contain alcohol and is unscented. Do not apply lotion between your toes. Shoes and socks  Wear clean socks or stockings every day. Make sure they are not too tight. Do not wear knee-high stockings since they may decrease blood flow to your legs.  Wear shoes that fit properly and have enough cushioning. Always look in your shoes before you put them on to be sure there are no objects inside.  To break in new shoes, wear them for just a few hours a day. This prevents injuries on your feet. Wounds, scrapes, corns, and calluses  Check your feet daily for blisters, cuts, bruises, sores, and redness. If you cannot see the bottom of your feet, use a mirror or ask someone for help.  Do not cut corns or calluses or try to remove them with medicine.  If you  find a minor scrape, cut, or break in the skin on your feet, keep it and the skin around it clean and dry. You may clean these areas with mild soap and water. Do not clean the area with peroxide, alcohol, or iodine.  If you have a wound, scrape, corn, or callus on your foot, look at it several times a day to make sure it is healing and not infected. Check for: ? Redness, swelling, or pain. ? Fluid or blood. ? Warmth. ? Pus or a bad smell. General instructions  Do not cross your legs. This may decrease blood flow to your feet.  Do not use heating pads or hot water bottles on your feet. They may burn your skin. If you have lost feeling in your feet or legs, you may not know this is happening until it is too late.  Protect your feet from hot and cold by wearing shoes, such as at the beach or on hot pavement.  Schedule a complete foot exam at least once a year (annually) or more often if you have foot problems. If you have foot problems, report any cuts, sores, or bruises to your health care provider immediately. Contact a health care provider if:  You have a medical condition that increases your risk of infection and you have any cuts, sores, or bruises on your feet.  You have an injury that is not   healing.  You have redness on your legs or feet.  You feel burning or tingling in your legs or feet.  You have pain or cramps in your legs and feet.  Your legs or feet are numb.  Your feet always feel cold.  You have pain around a toenail. Get help right away if:  You have a wound, scrape, corn, or callus on your foot and: ? You have pain, swelling, or redness that gets worse. ? You have fluid or blood coming from the wound, scrape, corn, or callus. ? Your wound, scrape, corn, or callus feels warm to the touch. ? You have pus or a bad smell coming from the wound, scrape, corn, or callus. ? You have a fever. ? You have a red line going up your leg. Summary  Check your feet every day  for cuts, sores, red spots, swelling, and blisters.  Moisturize feet and legs daily.  Wear shoes that fit properly and have enough cushioning.  If you have foot problems, report any cuts, sores, or bruises to your health care provider immediately.  Schedule a complete foot exam at least once a year (annually) or more often if you have foot problems. This information is not intended to replace advice given to you by your health care provider. Make sure you discuss any questions you have with your health care provider. Document Released: 04/07/2000 Document Revised: 05/23/2017 Document Reviewed: 05/12/2016 Elsevier Patient Education  2020 [employer]   Onychomycosis/Fungal Toenails  WHAT IS IT? An infection that lies within the keratin of your nail plate that is caused by a fungus.  WHY ME? Fungal infections affect all ages, sexes, races, and creeds.  There may be many factors that predispose you to a fungal infection such as age, coexisting medical conditions such as diabetes, or an autoimmune disease; stress, medications, fatigue, genetics, etc.  Bottom line: fungus thrives in a warm, moist environment and your shoes offer such a location.  IS IT CONTAGIOUS? Theoretically, yes.  You do not want to share shoes, nail clippers or files with someone who has fungal toenails.  Walking around barefoot in the same room or sleeping in the same bed is unlikely to transfer the organism.  It is important to realize, however, that fungus can spread easily from one nail to the next on the same foot.  HOW DO WE TREAT THIS?  There are several ways to treat this condition.  Treatment may depend on many factors such as age, medications, pregnancy, liver and kidney conditions, etc.  It is best to ask your doctor which options are available to you.  1. No treatment.   Unlike many other medical concerns, you can live with this condition.  However for many people this can be a painful condition and may lead to  ingrown toenails or a bacterial infection.  It is recommended that you keep the nails cut short to help reduce the amount of fungal nail. 2. Topical treatment.  These range from herbal remedies to prescription strength nail lacquers.  About 40-50% effective, topicals require twice daily application for approximately 9 to 12 months or until an entirely new nail has grown out.  The most effective topicals are medical grade medications available through physicians offices. 3. Oral antifungal medications.  With an 80-90% cure rate, the most common oral medication requires 3 to 4 months of therapy and stays in your system for a year as the new nail grows out.  Oral antifungal medications do require   blood work to make sure it is a safe drug for you.  A liver function panel will be performed prior to starting the medication and after the first month of treatment.  It is important to have the blood work performed to avoid any harmful side effects.  In general, this medication safe but blood work is required. 4. Laser Therapy.  This treatment is performed by applying a specialized laser to the affected nail plate.  This therapy is noninvasive, fast, and non-painful.  It is not covered by insurance and is therefore, out of pocket.  The results have been very good with a 80-95% cure rate.  [facility] is the only practice in the area to offer this therapy. 5. Permanent Nail Avulsion.  Removing the entire nail so that a new nail will not grow back. 

## 2019-01-14 ENCOUNTER — Other Ambulatory Visit: Payer: Medicare Other | Admitting: Orthotics

## 2019-01-16 NOTE — Progress Notes (Signed)
Subjective: Dean Brown is seen today for follow up painful, elongated, thickened toenails 1-5 b/l feet that he cannot cut. Pain interferes with daily activities. Aggravating factor includes wearing enclosed shoe gear and relieved with periodic debridement.  Current Outpatient Medications on File Prior to Visit  Medication Sig  . amLODipine (NORVASC) 10 MG tablet Take 10 mg by mouth daily.  Marland Kitchen aspirin 81 MG chewable tablet Chew 81 mg by mouth daily.  Marland Kitchen atorvastatin (LIPITOR) 80 MG tablet Take 80 mg by mouth daily.  Marland Kitchen azelastine (ASTELIN) 137 MCG/SPRAY nasal spray Place 1 spray into both nostrils 2 (two) times daily as needed for rhinitis. Use in each nostril as directed  . BISACODYL 5 MG EC tablet TK 1 T PO ONCE D PRF CONSTIPATION FOR UP TO 5 DAYS  . buPROPion (WELLBUTRIN XL) 150 MG 24 hr tablet Take 150 mg by mouth daily.   . candesartan (ATACAND) 8 MG tablet Take 8 mg by mouth daily.   . celecoxib (CELEBREX) 200 MG capsule Take 200 mg by mouth 2 (two) times daily.  . DULoxetine (CYMBALTA) 60 MG capsule Take 60 mg by mouth daily.   . furosemide (LASIX) 20 MG tablet Take 20 mg by mouth daily as needed for edema.  . gabapentin (NEURONTIN) 300 MG capsule Take 300 mg by mouth 4 (four) times daily.   Marland Kitchen glucagon (GLUCAGEN) 1 MG SOLR Inject 1 mg into the vein once as needed (severely low blood sugar).  . hydrochlorothiazide (HYDRODIURIL) 25 MG tablet Take 25 mg by mouth daily.   Marland Kitchen LANTUS SOLOSTAR 100 UNIT/ML Solostar Pen Inject 30 Units into the skin at bedtime.   Marland Kitchen losartan (COZAAR) 100 MG tablet Take 100 mg by mouth daily.   . metFORMIN (GLUCOPHAGE-XR) 500 MG 24 hr tablet Take 500 mg by mouth daily with breakfast.  . methocarbamol (ROBAXIN) 500 MG tablet Take 1 tablet (500 mg total) by mouth daily.  . metoprolol succinate (TOPROL-XL) 50 MG 24 hr tablet Take 50 mg by mouth daily.   Marland Kitchen MODAFINIL PO Take 200 mg by mouth daily.  . naproxen (NAPROSYN) 500 MG tablet Take 500 mg by mouth 2 (two) times  daily as needed.   Marland Kitchen omeprazole (PRILOSEC) 20 MG capsule Take 20 mg by mouth daily.  Marland Kitchen OZEMPIC, 1 MG/DOSE, 2 MG/1.5ML SOPN Inject 1 mg into the skin once a week.   . rosuvastatin (CRESTOR) 40 MG tablet Take 40 mg by mouth daily.   Marland Kitchen zolpidem (AMBIEN) 10 MG tablet Take 10 mg by mouth at bedtime as needed for sleep.   No current facility-administered medications on file prior to visit.      Allergies  Allergen Reactions  . No Known Allergies    Objective:  Vascular Examination: Capillary refill time immediate x 10 digits.  Dorsalis pedis present 1/4 b/l.  Posterior tibial pulses present 1/4 b/l.  Digital hair present x 10 digits.  Skin temperature gradient WNL b/l.   Dermatological Examination: Skin with normal turgor, texture and tone b/l.  Toenails 1-5 b/l discolored, thick, dystrophic with subungual debris and pain with palpation to nailbeds due to thickness of nails.  Hyperkeratotic lesion medial hallux IPJ b/l and dorsal 5th PIPJ right foot with tenderness to palpation. No edema, no erythema, no drainage, no flocculence.  Musculoskeletal: Muscle strength 5/5 to all LE muscle groups b/l.   Hammertoe 5th digit right foot.  Neurological Examination: Protective sensation intact 5/5 b/l with 10 gram monofilament bilaterally.  Assessment: Painful onychomycosis toenails 1-5  b/l  Callus b/l hallux Corn 5th digit NIDDM  Plan: 1. Toenails 1-5 b/l were debrided in length and girth without iatrogenic bleeding. Rx written for nonformulary compounding topical antifungal: [facility]: Antifungal cream - Terbinafine 3%, Fluconazole 2%, Tea Tree Oil 5%, Urea 10%, Ibuprofen 2% in DMSO Suspension #44ml. Apply to the affected nail(s) at bedtime. 2. Calluses pared b/l hallux utilizing sterile scalpel blade without incident. Corn right 5th digit pared utilizing sterile scalpel blade without incident. 3. Patient to continue soft, supportive shoe gear. 4. Patient to report any  pedal injuries to medical professional immediately. 5. Follow up 3 months.  6. Patient/POA to call should there be a concern in the interim.

## 2019-01-29 DIAGNOSIS — M79676 Pain in unspecified toe(s): Secondary | ICD-10-CM

## 2019-02-24 ENCOUNTER — Other Ambulatory Visit: Payer: Self-pay

## 2019-02-24 DIAGNOSIS — Z20822 Contact with and (suspected) exposure to covid-19: Secondary | ICD-10-CM

## 2019-02-26 LAB — NOVEL CORONAVIRUS, NAA: SARS-CoV-2, NAA: NOT DETECTED

## 2019-04-14 ENCOUNTER — Ambulatory Visit: Payer: Medicare Other | Admitting: Podiatry

## 2019-05-06 ENCOUNTER — Other Ambulatory Visit: Payer: Self-pay

## 2019-05-06 ENCOUNTER — Encounter: Payer: Self-pay | Admitting: Podiatry

## 2019-05-06 ENCOUNTER — Ambulatory Visit (INDEPENDENT_AMBULATORY_CARE_PROVIDER_SITE_OTHER): Payer: Medicare PPO | Admitting: Podiatry

## 2019-05-06 DIAGNOSIS — B351 Tinea unguium: Secondary | ICD-10-CM | POA: Diagnosis not present

## 2019-05-06 DIAGNOSIS — M79675 Pain in left toe(s): Secondary | ICD-10-CM

## 2019-05-06 DIAGNOSIS — E1140 Type 2 diabetes mellitus with diabetic neuropathy, unspecified: Secondary | ICD-10-CM

## 2019-05-06 DIAGNOSIS — L84 Corns and callosities: Secondary | ICD-10-CM

## 2019-05-06 DIAGNOSIS — M79674 Pain in right toe(s): Secondary | ICD-10-CM | POA: Diagnosis not present

## 2019-05-11 ENCOUNTER — Encounter: Payer: Self-pay | Admitting: Podiatry

## 2019-05-11 NOTE — Progress Notes (Signed)
Subjective: Dean Brown is a 74 y.o. y.o. male who presents today for preventative diabetic foot care with cc of painful, discolored, thick toenails. He also has corn and calluses which pose a risk due to his diabetes.  He has h/o diabetic neuropathy which is managed with gabapentin.  Whiteheart, Dyann Kief, PA-C is his PCP.   Medications reviewed in chart.  Allergies  Allergen Reactions  . Lisinopril Cough  . Metformin Nausea Only and Other (See Comments)    "makes him feel bad"   . No Known Allergies    Objective: There were no vitals filed for this visit.  Vascular Examination: Capillary refill time immediate x 10 digits.  Dorsalis pedis faintly palpable b/l.   Posterior tibial pulses faintly palpable b/l.   Digital hair present b/l.  Skin temperature gradient WNL b/l.  Dermatological Examination: Skin with normal turgor, texture and tone b/l.  Toenails 1-5 b/l discolored, thick, dystrophic with subungual debris and pain with palpation to nailbeds due to thickness of nails.  Hyperkeratotic lesions b/l hallux and dorsal 5th PIPJ right foot with tenderness to palpation. No edema, no erythema, no drainage, no flocculence.  Musculoskeletal: Muscle strength 5/5 to all LE muscle groups b/l.  Hammertoe right 5th digit.   No pain, crepitus or joint discomfort with active/passive ROM b/l.  Neurological: Sensation intact 5/5 with 10 gram monofilament b/l.  Assessment: 1. Painful onychomycosis toenails 1-5 b/l 2.  Callus b/l hallux 3.  Corn right 5th digit 4.  NIDDM with neuropathy  Plan: 1. Continue diabetic foot care principles. Literature dispensed on today. 2. Toenails 1-5 b/l were debrided in length and girth without iatrogenic bleeding. 3. Calluses pared b/l hallux utilizing sterile scalpel blade without incident.  4. Corn(s) right 5th digit pared utilizing sterile scalpel blade without incident.  5. Patient to continue soft, supportive shoe gear  daily. 6. Patient to report any pedal injuries to medical professional immediately. 7. Follow up 3 months.  8. Patient/POA to call should there be a concern in the interim.

## 2019-07-09 ENCOUNTER — Other Ambulatory Visit: Payer: Self-pay

## 2019-07-09 ENCOUNTER — Ambulatory Visit (INDEPENDENT_AMBULATORY_CARE_PROVIDER_SITE_OTHER): Payer: Medicare PPO | Admitting: Podiatry

## 2019-07-09 VITALS — Temp 97.4°F

## 2019-07-09 DIAGNOSIS — B351 Tinea unguium: Secondary | ICD-10-CM | POA: Diagnosis not present

## 2019-07-09 DIAGNOSIS — Z794 Long term (current) use of insulin: Secondary | ICD-10-CM | POA: Diagnosis not present

## 2019-07-09 DIAGNOSIS — M79674 Pain in right toe(s): Secondary | ICD-10-CM

## 2019-07-09 DIAGNOSIS — L84 Corns and callosities: Secondary | ICD-10-CM | POA: Diagnosis not present

## 2019-07-09 DIAGNOSIS — M2141 Flat foot [pes planus] (acquired), right foot: Secondary | ICD-10-CM | POA: Diagnosis not present

## 2019-07-09 DIAGNOSIS — M79675 Pain in left toe(s): Secondary | ICD-10-CM | POA: Diagnosis not present

## 2019-07-09 DIAGNOSIS — E119 Type 2 diabetes mellitus without complications: Secondary | ICD-10-CM

## 2019-07-09 DIAGNOSIS — Q828 Other specified congenital malformations of skin: Secondary | ICD-10-CM | POA: Diagnosis not present

## 2019-07-09 DIAGNOSIS — M2142 Flat foot [pes planus] (acquired), left foot: Secondary | ICD-10-CM

## 2019-07-09 NOTE — Patient Instructions (Signed)
Diabetes Mellitus and Foot Care Foot care is an important part of your health, especially when you have diabetes. Diabetes may cause you to have problems because of poor blood flow (circulation) to your feet and legs, which can cause your skin to:  Become thinner and drier.  Break more easily.  Heal more slowly.  Peel and crack. You may also have nerve damage (neuropathy) in your legs and feet, causing decreased feeling in them. This means that you may not notice minor injuries to your feet that could lead to more serious problems. Noticing and addressing any potential problems early is the best way to prevent future foot problems. How to care for your feet Foot hygiene  Wash your feet daily with warm water and mild soap. Do not use hot water. Then, pat your feet and the areas between your toes until they are completely dry. Do not soak your feet as this can dry your skin.  Trim your toenails straight across. Do not dig under them or around the cuticle. File the edges of your nails with an emery board or nail file.  Apply a moisturizing lotion or petroleum jelly to the skin on your feet and to dry, brittle toenails. Use lotion that does not contain alcohol and is unscented. Do not apply lotion between your toes. Shoes and socks  Wear clean socks or stockings every day. Make sure they are not too tight. Do not wear knee-high stockings since they may decrease blood flow to your legs.  Wear shoes that fit properly and have enough cushioning. Always look in your shoes before you put them on to be sure there are no objects inside.  To break in new shoes, wear them for just a few hours a day. This prevents injuries on your feet. Wounds, scrapes, corns, and calluses  Check your feet daily for blisters, cuts, bruises, sores, and redness. If you cannot see the bottom of your feet, use a mirror or ask someone for help.  Do not cut corns or calluses or try to remove them with medicine.  If you  find a minor scrape, cut, or break in the skin on your feet, keep it and the skin around it clean and dry. You may clean these areas with mild soap and water. Do not clean the area with peroxide, alcohol, or iodine.  If you have a wound, scrape, corn, or callus on your foot, look at it several times a day to make sure it is healing and not infected. Check for: ? Redness, swelling, or pain. ? Fluid or blood. ? Warmth. ? Pus or a bad smell. General instructions  Do not cross your legs. This may decrease blood flow to your feet.  Do not use heating pads or hot water bottles on your feet. They may burn your skin. If you have lost feeling in your feet or legs, you may not know this is happening until it is too late.  Protect your feet from hot and cold by wearing shoes, such as at the beach or on hot pavement.  Schedule a complete foot exam at least once a year (annually) or more often if you have foot problems. If you have foot problems, report any cuts, sores, or bruises to your health care provider immediately. Contact a health care provider if:  You have a medical condition that increases your risk of infection and you have any cuts, sores, or bruises on your feet.  You have an injury that is not   healing.  You have redness on your legs or feet.  You feel burning or tingling in your legs or feet.  You have pain or cramps in your legs and feet.  Your legs or feet are numb.  Your feet always feel cold.  You have pain around a toenail. Get help right away if:  You have a wound, scrape, corn, or callus on your foot and: ? You have pain, swelling, or redness that gets worse. ? You have fluid or blood coming from the wound, scrape, corn, or callus. ? Your wound, scrape, corn, or callus feels warm to the touch. ? You have pus or a bad smell coming from the wound, scrape, corn, or callus. ? You have a fever. ? You have a red line going up your leg. Summary  Check your feet every day  for cuts, sores, red spots, swelling, and blisters.  Moisturize feet and legs daily.  Wear shoes that fit properly and have enough cushioning.  If you have foot problems, report any cuts, sores, or bruises to your health care provider immediately.  Schedule a complete foot exam at least once a year (annually) or more often if you have foot problems. This information is not intended to replace advice given to you by your health care provider. Make sure you discuss any questions you have with your health care provider. Document Revised: 01/01/2019 Document Reviewed: 05/12/2016 Elsevier Patient Education  2020 [employer]  

## 2019-07-13 ENCOUNTER — Encounter: Payer: Self-pay | Admitting: Podiatry

## 2019-07-13 NOTE — Progress Notes (Addendum)
Subjective: Dean Brown presents today for follow up of preventative diabetic foot care, for diabetic foot evaluation and corn(s) right foot and callus(es) b/l feet and painful mycotic nails b/l.  Pain interferes with ambulation. Aggravating factors include wearing enclosed shoe gear.   He relates he had back surgery on March 25, 2019.  He states he is still in physical therapy and is doing well.  Allergies  Allergen Reactions  . Lisinopril Cough  . Metformin Nausea Only and Other (See Comments)    "makes him feel bad"   . No Known Allergies      Objective: Vitals:   07/09/19 0854  Temp: (!) 97.4 F (36.3 C)    Pt 74 y.o. year old AA male  in NAD. AAO x 3.   Vascular Examination:  Capillary refill time to digits immediate b/l. Faintly palpable pedal pulses b/l. Pedal hair present b/l. Skin temperature gradient within normal limits b/l.  Dermatological Examination: Pedal skin with normal turgor, texture and tone bilaterally. No open wounds bilaterally. No interdigital macerations bilaterally. Toenails 1-5 b/l elongated, dystrophic, thickened, crumbly with subungual debris and tenderness to dorsal palpation. Hyperkeratotic lesion(s) b/l hallux and dorsal 5th digit PIPJ right.  No erythema, no edema, no drainage, no flocculence. Porokeratotic lesion(s) plantar aspect of left heel. No erythema, no edema, no drainage, no flocculence.  Musculoskeletal: Normal muscle strength 5/5 to all lower extremity muscle groups bilaterally, no pain crepitus or joint limitation noted with ROM b/l, hammertoes noted to the  R 5th toe and pes planus deformity noted  Neurological: Protective sensation intact 5/5 intact bilaterally with 10g monofilament b/l Vibratory sensation intact b/l Proprioception intact bilaterally  Assessment: 1. Pain due to onychomycosis of toenails of both feet   2. Corns and callosities   3. Porokeratosis   4. Type 2 diabetes mellitus without complication, with long-term  current use of insulin ([place])   5. Encounter for diabetic foot exam ([place])    Plan: -Diabetic foot examination performed on today's visit. -Continue diabetic foot care principles. Literature dispensed on today.  -Toenails 1-5 b/l were debrided in length and girth with sterile nail nippers and dremel without iatrogenic bleeding.  -Corn(s) right 5th digit and callus(es) b/l hallux were debrided without complication or incident. Total number debrided =3. -Painful porokeratotic lesion(s) plantar aspect left heel pared and enucleated with sterile scalpel blade without incident. -Patient to continue soft, supportive shoe gear daily. -Patient to report any pedal injuries to medical professional immediately. -Patient/POA to call should there be question/concern in the interim.  Return in about 9 weeks (around 09/10/2019) for diabetic nail and callus trim.

## 2019-07-29 ENCOUNTER — Ambulatory Visit: Payer: Medicare PPO | Admitting: Podiatry

## 2019-09-24 ENCOUNTER — Other Ambulatory Visit: Payer: Self-pay

## 2019-09-24 ENCOUNTER — Ambulatory Visit (INDEPENDENT_AMBULATORY_CARE_PROVIDER_SITE_OTHER): Payer: Medicare PPO | Admitting: Podiatry

## 2019-09-24 ENCOUNTER — Encounter: Payer: Self-pay | Admitting: Podiatry

## 2019-09-24 DIAGNOSIS — Z794 Long term (current) use of insulin: Secondary | ICD-10-CM | POA: Diagnosis not present

## 2019-09-24 DIAGNOSIS — B351 Tinea unguium: Secondary | ICD-10-CM

## 2019-09-24 DIAGNOSIS — M79674 Pain in right toe(s): Secondary | ICD-10-CM | POA: Diagnosis not present

## 2019-09-24 DIAGNOSIS — M79675 Pain in left toe(s): Secondary | ICD-10-CM

## 2019-09-24 DIAGNOSIS — E119 Type 2 diabetes mellitus without complications: Secondary | ICD-10-CM

## 2019-09-24 DIAGNOSIS — L84 Corns and callosities: Secondary | ICD-10-CM

## 2019-09-24 NOTE — Patient Instructions (Signed)
Diabetes Mellitus and Foot Care Foot care is an important part of your health, especially when you have diabetes. Diabetes may cause you to have problems because of poor blood flow (circulation) to your feet and legs, which can cause your skin to:  Become thinner and drier.  Break more easily.  Heal more slowly.  Peel and crack. You may also have nerve damage (neuropathy) in your legs and feet, causing decreased feeling in them. This means that you may not notice minor injuries to your feet that could lead to more serious problems. Noticing and addressing any potential problems early is the best way to prevent future foot problems. How to care for your feet Foot hygiene  Wash your feet daily with warm water and mild soap. Do not use hot water. Then, pat your feet and the areas between your toes until they are completely dry. Do not soak your feet as this can dry your skin.  Trim your toenails straight across. Do not dig under them or around the cuticle. File the edges of your nails with an emery board or nail file.  Apply a moisturizing lotion or petroleum jelly to the skin on your feet and to dry, brittle toenails. Use lotion that does not contain alcohol and is unscented. Do not apply lotion between your toes. Shoes and socks  Wear clean socks or stockings every day. Make sure they are not too tight. Do not wear knee-high stockings since they may decrease blood flow to your legs.  Wear shoes that fit properly and have enough cushioning. Always look in your shoes before you put them on to be sure there are no objects inside.  To break in new shoes, wear them for just a few hours a day. This prevents injuries on your feet. Wounds, scrapes, corns, and calluses  Check your feet daily for blisters, cuts, bruises, sores, and redness. If you cannot see the bottom of your feet, use a mirror or ask someone for help.  Do not cut corns or calluses or try to remove them with medicine.  If you  find a minor scrape, cut, or break in the skin on your feet, keep it and the skin around it clean and dry. You may clean these areas with mild soap and water. Do not clean the area with peroxide, alcohol, or iodine.  If you have a wound, scrape, corn, or callus on your foot, look at it several times a day to make sure it is healing and not infected. Check for: ? Redness, swelling, or pain. ? Fluid or blood. ? Warmth. ? Pus or a bad smell. General instructions  Do not cross your legs. This may decrease blood flow to your feet.  Do not use heating pads or hot water bottles on your feet. They may burn your skin. If you have lost feeling in your feet or legs, you may not know this is happening until it is too late.  Protect your feet from hot and cold by wearing shoes, such as at the beach or on hot pavement.  Schedule a complete foot exam at least once a year (annually) or more often if you have foot problems. If you have foot problems, report any cuts, sores, or bruises to your health care provider immediately. Contact a health care provider if:  You have a medical condition that increases your risk of infection and you have any cuts, sores, or bruises on your feet.  You have an injury that is not   healing.  You have redness on your legs or feet.  You feel burning or tingling in your legs or feet.  You have pain or cramps in your legs and feet.  Your legs or feet are numb.  Your feet always feel cold.  You have pain around a toenail. Get help right away if:  You have a wound, scrape, corn, or callus on your foot and: ? You have pain, swelling, or redness that gets worse. ? You have fluid or blood coming from the wound, scrape, corn, or callus. ? Your wound, scrape, corn, or callus feels warm to the touch. ? You have pus or a bad smell coming from the wound, scrape, corn, or callus. ? You have a fever. ? You have a red line going up your leg. Summary  Check your feet every day  for cuts, sores, red spots, swelling, and blisters.  Moisturize feet and legs daily.  Wear shoes that fit properly and have enough cushioning.  If you have foot problems, report any cuts, sores, or bruises to your health care provider immediately.  Schedule a complete foot exam at least once a year (annually) or more often if you have foot problems. This information is not intended to replace advice given to you by your health care provider. Make sure you discuss any questions you have with your health care provider. Document Revised: 01/01/2019 Document Reviewed: 05/12/2016 Elsevier Patient Education  2020 [employer]  

## 2019-09-28 NOTE — Progress Notes (Signed)
Subjective: Dean Brown is a pleasant 74 y.o. male patient seen today preventative diabetic foot care and painful corn(s) right 5th digit and callus(es) b/l hallux and painful mycotic nails b/l.  Pain interferes with ambulation. Aggravating factors include wearing enclosed shoe gear.  Patient Active Problem List   Diagnosis Date Noted  . DM2 (diabetes mellitus, type 2) ([place]) 09/24/2018  . Essential hypertension 09/24/2018  . Hypokalemia 09/24/2018  . Chest pain 09/23/2018  . Pseudophakia of right eye 09/17/2018  . Rotator cuff syndrome of right shoulder 06/21/2018  . History of prostate cancer 12/04/2017  . Insomnia 09/12/2017  . ED (erectile dysfunction) 01/23/2017  . 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 Gross hematuria 11/16/2014  . 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 SIRS (systemic inflammatory response syndrome) ([place]) 12/03/2013  . Acid reflux 12/03/2013  . BP (high blood pressure) 12/03/2013  . Arthritis, degenerative 12/03/2013  . History of penile implant 06/16/2013  . HLD (hyperlipidemia) 03/08/2012  . Obstructive apnea 08/04/2011  . 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 ACHILLES TENDINITIS 11/10/2008  . PES PLANUS 11/10/2008  . HALLUX VALGUS 11/10/2008    Current Outpatient Medications on File Prior to Visit  Medication Sig Dispense Refill  . sildenafil (VIAGRA) 100 MG tablet TAKE ONE-HALF TABLET BY MOUTH AS DIRECTED FOR ERECTILE DYSFUNCTION ;1 HOUR BEFORE SEXUAL ACTIVITY. DO NOT TAKE WITHIN 6 HOURS OF TERAZOSIN, PRAZOSIN, ALFUZOSIN OR DOXAZOSIN. DO NOT TAKE MORE THAN 1 DOSE WITHIN 24 HOURS . DO NOT TAKE WITH NITRATES  REFILLS BY PCP FOR ERECTILE DYSFUNCTION ;1 HOUR BEFORE SEXUAL ACTIVITY. DO NOT TAKE WITHIN 6 HOURS OF TERAZOSIN, PRAZOSIN, ALFUZOSIN OR DOXAZOSIN. DO NOT TAKE MORE THAN 1 DOSE WITHIN 24 HOURS . DO NOT TAKE WITH NITRATES  REFILLS BY PCP    . amLODipine (NORVASC) 10 MG tablet Take 10 mg by mouth daily.     Marland Kitchen aspirin 81 MG chewable tablet Chew 81 mg by mouth daily.    Marland Kitchen atorvastatin (LIPITOR) 80 MG tablet Take 80 mg by mouth daily.    Marland Kitchen azelastine (ASTELIN) 137 MCG/SPRAY nasal spray Place 1 spray into both nostrils 2 (two) times daily as needed for rhinitis. Use in each nostril as directed    . BISACODYL 5 MG EC tablet TK 1 T PO ONCE D PRF CONSTIPATION FOR UP TO 5 DAYS    . buPROPion (WELLBUTRIN XL) 150 MG 24 hr tablet Take 150 mg by mouth daily.     Marland Kitchen buPROPion (ZYBAN) 150 MG 12 hr tablet Take by mouth.    . busPIRone (BUSPAR) 10 MG tablet Take by mouth.    . candesartan (ATACAND) 8 MG tablet Take 8 mg by mouth daily.     . celecoxib (CELEBREX) 200 MG capsule Take 200 mg by mouth 2 (two) times daily.    . diazepam (VALIUM) 5 MG tablet Take by mouth.    Mariane Baumgarten Sodium (DSS) 100 MG CAPS Take by mouth.    . DULoxetine (CYMBALTA) 60 MG capsule Take 60 mg by mouth daily.     . furosemide (LASIX) 20 MG tablet Take 20 mg by mouth daily as needed for edema.    . gabapentin (NEURONTIN) 300 MG capsule Take 300 mg by mouth 4 (four) times daily.     Marland Kitchen glucagon (GLUCAGEN) 1 MG SOLR Inject 1 mg into the vein once as needed (severely low blood sugar).    Marland Kitchen glucagon 1 MG injection Inject into the vein.    Marland Kitchen  hydrochlorothiazide (HYDRODIURIL) 25 MG tablet Take 25 mg by mouth daily.     Marland Kitchen HYDROcodone-acetaminophen (NORCO/VICODIN) 5-325 MG tablet Take 1-2 tablets by mouth every 4 hours as needed for pain    . LANTUS SOLOSTAR 100 UNIT/ML Solostar Pen Inject 30 Units into the skin at bedtime.     Marland Kitchen losartan (COZAAR) 100 MG tablet Take 100 mg by mouth daily.     . metFORMIN (GLUCOPHAGE-XR) 500 MG 24 hr tablet Take 500 mg by mouth daily with breakfast.    . methocarbamol (ROBAXIN) 500 MG tablet Take 1 tablet (500 mg total) by mouth daily.    . metoprolol succinate (TOPROL-XL) 50 MG 24 hr tablet Take 50 mg by mouth daily.     Marland Kitchen MODAFINIL PO Take 200 mg by mouth daily.    . naproxen (NAPROSYN) 500 MG tablet Take  500 mg by mouth 2 (two) times daily as needed.     . NONFORMULARY OR COMPOUNDED ITEM Antifungal solution: Terbinafine 3%, Fluconazole 2%, Tea Tree Oil 5%, Urea 10%, Ibuprofen 2% in DMSO suspension #26m 1 each 3  . omeprazole (PRILOSEC) 20 MG capsule Take 20 mg by mouth daily.    .Marland KitchenOZEMPIC, 1 MG/DOSE, 2 MG/1.5ML SOPN Inject 1 mg into the skin once a week.     .Marland KitchenPENNSAID 2 % SOLN SMARTSIG:1 Pump Topical 3 Times Daily PRN    . rosuvastatin (CRESTOR) 40 MG tablet Take 40 mg by mouth daily.     . traZODone (DESYREL) 50 MG tablet Take by mouth.    .Marland KitchenUNABLE TO FIND INJECT 1MG KIT INTRAMUSCULARLY AS DIRECTED    . UNABLE TO FIND Inject into the skin.    .Marland Kitchenzolpidem (AMBIEN) 10 MG tablet Take 10 mg by mouth at bedtime as needed for sleep.     No current facility-administered medications on file prior to visit.    Allergies  Allergen Reactions  . Lisinopril Cough  . Metformin Nausea Only and Other (See Comments)    "makes him feel bad"   . No Known Allergies     Objective: Physical Exam  General: Dean SPIVAKis a pleasant 74y.o. African American male, in NAD. AAO x 3.   Vascular:  Neurovascular status unchanged b/l lower extremities. Capillary refill time to digits immediate b/l. Faintly palpable DP pulses b/l. Faintly palpable PT pulses b/l. Pedal hair present b/l. Skin temperature gradient within normal limits b/l. No edema noted b/l.  Dermatological:  Pedal skin with normal turgor, texture and tone bilaterally. No open wounds bilaterally. No interdigital macerations bilaterally. Toenails 1-5 b/l elongated, discolored, dystrophic, thickened, crumbly with subungual debris and tenderness to dorsal palpation. Hyperkeratotic lesion(s) L hallux, R hallux and R 5th toe.  No erythema, no edema, no drainage, no flocculence.  Musculoskeletal:  Normal muscle strength 5/5 to all lower extremity muscle groups bilaterally. No pain crepitus or joint limitation noted with ROM b/l. Hammertoes noted to  the R 5th toe. Pes planus deformity noted b/l.   Neurological:  Protective sensation intact 5/5 intact bilaterally with 10g monofilament b/l. Vibratory sensation intact b/l.  Assessment and Plan:  1. Pain due to onychomycosis of toenails of both feet   2. Corns and callosities   3. Type 2 diabetes mellitus without complication, with long[place]term [place] use of insulin [place]    -Examined patient. -No new findings. No new orders. -Toenails 1-5 b/l were debrided in length and girth with sterile nail nippers and dremel without iatrogenic bleeding.  -Corn(s) R 5th  toe and callus(es) L hallux and R hallux were pared utilizing sterile scalpel blade without incident. Total number debrided =3. -Patient to continue soft, supportive shoe gear daily. -Patient to report any pedal injuries to medical professional immediately. -Patient/POA to call should there be question/concern in the interim.  Return in about 9 weeks (around 11/26/2019) for diabetic nail and callus trim.  Marzetta Board, DPM

## 2019-10-09 ENCOUNTER — Ambulatory Visit: Payer: Medicare PPO | Admitting: Orthotics

## 2019-10-09 ENCOUNTER — Other Ambulatory Visit: Payer: Self-pay

## 2019-10-09 DIAGNOSIS — M79674 Pain in right toe(s): Secondary | ICD-10-CM

## 2019-10-09 DIAGNOSIS — L84 Corns and callosities: Secondary | ICD-10-CM

## 2019-10-09 NOTE — Progress Notes (Signed)
PPatient was measured for med necessity extra depth shoes (x2) w/ 3 pair (x6) custom f/o. Patient was measured w/ [place]annock to determine size and width.  Foam impression mold was achieved and deemed appropriate for fabrication of  cmfo.   See DPM chart notes for further documentation and dx codes for determination of medical necessity.  Appropriate forms will be sent to PCP to verify and sign off on medical necessity.  

## 2019-10-14 ENCOUNTER — Encounter (HOSPITAL_COMMUNITY): Payer: Self-pay | Admitting: Orthopedic Surgery

## 2019-10-14 DIAGNOSIS — M1612 Unilateral primary osteoarthritis, left hip: Secondary | ICD-10-CM

## 2019-10-14 HISTORY — DX: Unilateral primary osteoarthritis, left hip: M16.12

## 2019-10-14 NOTE — H&P (Signed)
TOTAL HIP ADMISSION H&P  Patient is admitted for left total hip arthroplasty.  Subjective:  Chief Complaint: left hip pain  HPI: Dean Brown, 74 y.o. male, has a history of pain and functional disability in the left hip(s) due to arthritis and patient has failed non-surgical conservative treatments for greater than 12 weeks to include NSAID's and/or analgesics, corticosteriod injections, viscosupplementation injections, flexibility and strengthening excercises, supervised PT with diminished ADL's post treatment, use of assistive devices and activity modification.  Onset of symptoms was gradual starting 10 years ago with gradually worsening course since that time.The patient noted no past surgery on the left hip(s).  Patient currently rates pain in the left hip at 10 out of 10 with activity. Patient has night pain, worsening of pain with activity and weight bearing, trendelenberg gait, pain that interfers with activities of daily living, pain with passive range of motion, crepitus and joint swelling. Patient has evidence of subchondral cysts, subchondral sclerosis and joint space narrowing by imaging studies. This condition presents safety issues increasing the risk of falls.  There is no current active infection.  Patient Active Problem List   Diagnosis Date Noted  . Primary localized osteoarthritis of left hip 10/14/2019  . DM2 (diabetes mellitus, type 2) ([place]) 09/24/2018  . Essential hypertension 09/24/2018  . Hypokalemia 09/24/2018  . Chest pain 09/23/2018  . Pseudophakia of right eye 09/17/2018  . Rotator cuff syndrome of right shoulder 06/21/2018  . History of prostate cancer 12/04/2017  . Insomnia 09/12/2017  . ED (erectile dysfunction) 01/23/2017  . 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 Gross hematuria 11/16/2014  . 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 SIRS (systemic inflammatory response syndrome) ([place]) 12/03/2013  . Acid  reflux 12/03/2013  . BP (high blood pressure) 12/03/2013  . Arthritis, degenerative 12/03/2013  . History of penile implant 06/16/2013  . HLD (hyperlipidemia) 03/08/2012  . Obstructive apnea 08/04/2011  . 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 ACHILLES TENDINITIS 11/10/2008  . PES PLANUS 11/10/2008  . HALLUX VALGUS 11/10/2008   Past Medical History:  Diagnosis Date  . Arthritis   . Cancer [facility])    Prostate cancer  . Diabetes mellitus    type 2  . GERD (gastroesophageal reflux disease)   . Hypertension   . Pneumonia 12/03/2013  . Primary localized osteoarthritis of left hip 10/14/2019  . Sleep apnea    cpap sleep study - [facility]    Past Surgical History:  Procedure Laterality Date  . KNEE SURGERY Right 10/2012  . LUMBAR LAMINECTOMY/DECOMPRESSION MICRODISCECTOMY  11/01/2011   Procedure: LUMBAR LAMINECTOMY/DECOMPRESSION MICRODISCECTOMY;  Surgeon: Sinclair Ship, MD;  Location: [place];  Service: Orthopedics;  Laterality: Left;  Left sided lumbar 5- sacrum 1 microdisectomy  . microdiskectomy  11/01/2011  . PROSTATE SURGERY     s/p cancer  . ROTATOR CUFF REPAIR     left shoulder    No current facility-administered medications for this encounter.   Current Outpatient Medications  Medication Sig Dispense Refill Last Dose  . amLODipine (NORVASC) 10 MG tablet Take 10 mg by mouth daily.     Marland Kitchen aspirin 81 MG chewable tablet Chew 81 mg by mouth daily.     Marland Kitchen buPROPion (WELLBUTRIN XL) 150 MG 24 hr tablet Take 150 mg by mouth daily.      . candesartan (ATACAND) 8 MG tablet Take 8 mg by mouth in the morning and at bedtime.      . docusate sodium (COLACE) 100  MG capsule Take 100 mg by mouth once a week.     . DULoxetine (CYMBALTA) 60 MG capsule Take 60 mg by mouth daily.      Marland Kitchen gabapentin (NEURONTIN) 300 MG capsule Take by mouth in the morning and at bedtime.      Marland Kitchen glucagon (GLUCAGEN) 1 MG SOLR Inject 1 mg into the vein once as needed (severely low blood sugar).     . hydrochlorothiazide  (HYDRODIURIL) 25 MG tablet Take 25 mg by mouth daily.      Marland Kitchen LANTUS SOLOSTAR 100 UNIT/ML Solostar Pen Inject 26 Units into the skin at bedtime.      . metoprolol succinate (TOPROL-XL) 50 MG 24 hr tablet Take 50 mg by mouth daily.      Marland Kitchen omeprazole (PRILOSEC) 20 MG capsule Take 20 mg by mouth daily. Take 1 capsule (20 mg) by mouth scheduled every morning, may take an additional dose in the evening if needed for acid reflux/indigestion.     Marland Kitchen OZEMPIC, 1 MG/DOSE, 4 MG/3ML SOPN Inject 1 mg into the skin every Wednesday.     . rosuvastatin (CRESTOR) 40 MG tablet Take 40 mg by mouth at bedtime.      . sildenafil (VIAGRA) 100 MG tablet Take 100 mg by mouth daily as needed for erectile dysfunction.      . NONFORMULARY OR COMPOUNDED ITEM Antifungal solution: Terbinafine 3%, Fluconazole 2%, Tea Tree Oil 5%, Urea 10%, Ibuprofen 2% in DMSO suspension #30mL (Patient not taking: Reported on 10/14/2019) 1 each 3 Not Taking at Unknown time   Allergies  Allergen Reactions  . Lisinopril Cough  . Metformin Nausea Only and Other (See Comments)    "makes him feel bad"     Social History   Tobacco Use  . Smoking status: Former Smoker    Quit date: 05/04/1993    Years since quitting: 26.4  . Smokeless tobacco: Never Used  Substance Use Topics  . Alcohol use: Yes    Comment: ocassional    Family History  Problem Relation Age of Onset  . Alzheimer's disease Mother   . Cancer Father      Review of Systems  Constitutional: Negative.   HENT: Negative.   Eyes: Negative.   Respiratory: Negative.   Cardiovascular: Negative.   Gastrointestinal: Negative.   Endocrine: Negative.   Genitourinary: Negative.   Musculoskeletal: Positive for arthralgias, gait problem and joint swelling.  Skin: Negative.   Allergic/Immunologic: Negative.   Hematological: Negative.   Psychiatric/Behavioral: Negative.     Objective:  Physical Exam  HENT:  Head: Normocephalic and atraumatic.  Right Ear: Tympanic membrane  normal.  Left Ear: Tympanic membrane normal.  Mouth/Throat: Mucous membranes are moist.  Eyes: Pupils are equal, round, and reactive to light.  Cardiovascular: Normal rate, regular rhythm, normal heart sounds and normal pulses.  Respiratory: Breath sounds normal.  GI: Soft. Normal appearance and bowel sounds are normal.  Genitourinary:    Genitourinary Comments: Not pertinent to current symptomatology therefore not examined.   Musculoskeletal:     Cervical back: Neck supple.     Comments: Positive [place].  Negative straight leg raise.  Neurovascularly intact.    Neurological: He is alert.  Skin: Skin is warm and dry. Capillary refill takes 2 to 3 seconds.  Psychiatric: His behavior is normal. Mood normal.    Vital signs in last 24 hours: Temp:  [97.8 F (36.6 C)] 97.8 F (36.6 C) (06/22 1500) Pulse Rate:  [79] 79 (06/22 1500) BP: (161)/(81) 161/81 (  06/22 1500) SpO2:  [99 %] 99 % (06/22 1500) Weight:  [79.8 kg] 79.8 kg (06/22 1500)  Labs:   Estimated body mass index is 29.29 kg/m as calculated from the following:   Height as of this encounter: 5\' 5"  (1.651 m).   Weight as of this encounter: 79.8 kg.   Imaging Review Plain radiographs demonstrate severe degenerative joint disease of the left hip(s). The bone quality appears to be good for age and reported activity level.      Assessment/Plan:  End stage arthritis, left hip(s) Principal Problem:   Primary localized osteoarthritis of left hip Active Problems:   BP (high blood pressure)   Obstructive apnea   Spinal stenosis of lumbar region with neurogenic claudication   DM2 (diabetes mellitus, type 2) ([place])   Essential hypertension   The patient history, physical examination, clinical judgement of the provider and imaging studies are consistent with end stage degenerative joint disease of the left hip(s) and total hip arthroplasty is deemed medically necessary. The treatment options including medical  management, injection therapy, arthroscopy and arthroplasty were discussed at length. The risks and benefits of total hip arthroplasty were presented and reviewed. The risks due to aseptic loosening, infection, stiffness, dislocation/subluxation,  thromboembolic complications and other imponderables were discussed.  The patient acknowledged the explanation, agreed to proceed with the plan and consent was signed. Patient is being admitted for inpatient treatment for surgery, pain control, PT, OT, prophylactic antibiotics, VTE prophylaxis, progressive ambulation and ADL's and discharge planning.The patient is planning to be discharged to home outpatient physical therapy is scheduled at Dr Debroah Loop office on 11/06/2019

## 2019-10-21 NOTE — Patient Instructions (Addendum)
DUE TO COVID-19 ONLY ONE VISITOR IS ALLOWED TO COME WITH YOU AND STAY IN THE WAITING ROOM ONLY DURING PRE OP AND PROCEDURE DAY OF SURGERY. THE 1 VISITOR MAY VISIT WITH YOU AFTER SURGERY IN YOUR PRIVATE ROOM DURING VISITING HOURS ONLY!  YOU NEED TO HAVE A COVID 19 TEST ON__7/9_____ @_8 :[place] , [address].  ([place]) ONCE YOUR COVID TEST IS COMPLETED, PLEASE BEGIN THE QUARANTINE INSTRUCTIONS AS OUTLINED IN YOUR HANDOUT.                Dean Brown    Your procedure is scheduled on: 11/04/19   Report to [facility] Main  Entrance   Report to admitting at  10:05 AM     Call this number if you have problems the morning of surgery [place], NO CHEWING GUM [place].    Do not eat food After Midnight.   YOU MAY HAVE CLEAR LIQUIDS FROM MIDNIGHT UNTIL 9:30 AM     CLEAR LIQUID DIET   Foods Allowed                                                                     Foods Excluded  Coffee and tea, regular and decaf                             liquids that you cannot  Plain Jell-O any favor except red or purple                                           see through such as: Fruit ices (not with fruit pulp)                                     milk, soups, orange juice  Iced Popsicles                                    All solid food Carbonated beverages, regular and diet                                     apple juices Sports drinks like Gatorade Lightly seasoned clear broth or consume(fat free) Sugar, honey syrup    . At 9:30 AM Please finish the prescribed Pre-Surgery Gatorade drink.   Nothing by mouth after you finish the Gatorade drink !    Take these medicines the morning of surgery with A SIP OF WATER: Wellbutrin, Cymbalta,Gabapentin, Metoprolol, Amlodipine, Prilosec  Bring your mask and tubing to the  hospital   DO NOT [place]     How to Manage Your Diabetes Before and After Surgery  Why is it important to control my blood sugar before and after surgery?  Improving blood sugar levels before and after surgery helps healing and can limit problems.  A way of improving blood sugar control is eating a healthy diet by: o  Eating less sugar and carbohydrates o  Increasing activity/exercise o  Talking with your doctor about reaching your blood sugar goals  High blood sugars (greater than 180 mg/dL) can raise your risk of infections and slow your recovery, so you will need to focus on controlling your diabetes during the weeks before surgery.  Make sure that the doctor who takes care of your diabetes knows about your planned surgery including the date and location.  How do I manage my blood sugar before surgery?  Check your blood sugar at least 4 times a day, starting 2 days before surgery, to make sure that the level is not too high or low. o Check your blood sugar the morning of your surgery when you wake up and every 2 hours until you get to the Short Stay unit.  If your blood sugar is less than 70 mg/dL, you will need to treat for low blood sugar: o Do not take insulin. o Treat a low blood sugar (less than 70 mg/dL) with  cup of clear juice (cranberry or apple), 4 glucose tablets, OR glucose gel. o Recheck blood sugar in 15 minutes after treatment (to make sure it is greater than 70 mg/dL). If your blood sugar is not greater than 70 mg/dL on recheck, call [phone] for further instructions.  Report your blood sugar to the short stay nurse when you get to Short Stay.   If you are admitted to the hospital after surgery: o Your blood sugar will be checked by the staff and you will probably be given insulin after surgery (instead of oral diabetes medicines) to make sure you have good blood sugar levels. o The goal for blood sugar control  after surgery is 80-180 mg/dL.   WHAT DO I DO ABOUT MY DIABETES MEDICATION?   Do not take oral diabetes medicines (pills) the morning of surgery.   THE NIGHT BEFORE SURGERY, take12     units of lantus  insulin.        THE MORNING OF SURGERY, take0   units of   insulin.   The day of surgery, do not take other diabetes injectables, including Byetta (exenatide), Bydureon (exenatide ER), Victoza (liraglutide), or Trulicity (dulaglutide).               You may not have any metal on your body including               piercings  Do not wear jewelry, , lotions, powders or deodorant                       Men may shave face and neck.   Do not bring valuables to the hospital. [place].  Contacts, dentures or bridgework may not be worn into surgery.       Special Instructions: N/A              Please read over  the following fact sheets you were given: _____________________________________________________________________             [facility] - Preparing for Surgery Before surgery, you can play an important role.   Because skin is not sterile, your skin needs to be as free of germs as possible .  You can reduce the number of germs on your skin by washing with CHG (chlorahexidine gluconate) soap before surgery.   CHG is an antiseptic cleaner which kills germs and bonds with the skin to continue killing germs even after washing. Please DO NOT use if you have an allergy to CHG or antibacterial soaps.   If your skin becomes reddened/irritated stop using the CHG and inform your nurse when you arrive at Short Stay.  You may shave your face/neck.  Please follow these instructions carefully:  1.  Shower with CHG Soap the night before surgery and the  morning of Surgery.  2.  If you choose to wash your hair, wash your hair first as usual with your  normal  shampoo.  3.  After you shampoo, rinse your hair and body thoroughly to remove the   shampoo.                                        4.  Use CHG as you would any other liquid soap.  You can apply chg directly  to the skin and wash                       Gently with a scrungie or clean washcloth.  5.  Apply the CHG Soap to your body ONLY FROM THE NECK DOWN.   Do not use on face/ open                           Wound or open sores. Avoid contact with eyes, ears mouth and genitals (private parts).                       Wash face,  Genitals (private parts) with your normal soap.             6.  Wash thoroughly, paying special attention to the area where your surgery  will be performed.  7.  Thoroughly rinse your body with warm water from the neck down.  8.  DO NOT shower/wash with your normal soap after using and rinsing off  the CHG Soap.             9.  Pat yourself dry with a clean towel.            10.  Wear clean pajamas.            11.  Place clean sheets on your bed the night of your first shower and do not  sleep with pets. Day of Surgery : Do not apply any lotions/deodorants the morning of surgery.  Please wear clean clothes to the hospital/surgery center.  FAILURE TO FOLLOW THESE INSTRUCTIONS MAY RESULT IN THE CANCELLATION OF YOUR SURGERY PATIENT SIGNATURE_________________________________  NURSE SIGNATURE__________________________________  ________________________________________________________________________   Dean Brown  An incentive spirometer is a tool that can help keep your lungs clear and active. This tool measures how well you are filling your lungs with each breath. Taking long deep breaths may help reverse or  decrease the chance of developing breathing (pulmonary) problems (especially infection) following:  A long period of time when you are unable to move or be active. BEFORE THE PROCEDURE   If the spirometer includes an indicator to show your best effort, your nurse or respiratory therapist will set it to a desired goal.  If possible, sit  up straight or lean slightly forward. Try not to slouch.  Hold the incentive spirometer in an upright position. INSTRUCTIONS FOR USE  1. Sit on the edge of your bed if possible, or sit up as far as you can in bed or on a chair. 2. Hold the incentive spirometer in an upright position. 3. Breathe out normally. 4. Place the mouthpiece in your mouth and seal your lips tightly around it. 5. Breathe in slowly and as deeply as possible, raising the piston or the ball toward the top of the column. 6. Hold your breath for 3-5 seconds or for as long as possible. Allow the piston or ball to fall to the bottom of the column. 7. Remove the mouthpiece from your mouth and breathe out normally. 8. Rest for a few seconds and repeat Steps 1 through 7 at least 10 times every 1-2 hours when you are awake. Take your time and take a few normal breaths between deep breaths. 9. The spirometer may include an indicator to show your best effort. Use the indicator as a goal to work toward during each repetition. 10. After each set of 10 deep breaths, practice coughing to be sure your lungs are clear. If you have an incision (the cut made at the time of surgery), support your incision when coughing by placing a pillow or rolled up towels firmly against it. Once you are able to get out of bed, walk around indoors and cough well. You may stop using the incentive spirometer when instructed by your caregiver.  RISKS AND COMPLICATIONS  Take your time so you do not get dizzy or light-headed.  If you are in pain, you may need to take or ask for pain medication before doing incentive spirometry. It is harder to take a deep breath if you are having pain. AFTER USE  Rest and breathe slowly and easily.  It can be helpful to keep track of a log of your progress. Your caregiver can provide you with a simple table to help with this. If you are using the spirometer at home, follow these instructions: [place] IF:   You are  having difficultly using the spirometer.  You have trouble using the spirometer as often as instructed.  Your pain medication is not giving enough relief while using the spirometer.  You develop fever of 100.5 F (38.1 C) or higher. SEEK IMMEDIATE MEDICAL CARE IF:   You cough up bloody sputum that had not been present before.  You develop fever of 102 F (38.9 C) or greater.  You develop worsening pain at or near the incision site. MAKE SURE YOU:   Understand these instructions.  Will watch your condition.  Will get help right away if you are not doing well or get worse. Document Released: 08/21/2006 Document Revised: 07/03/2011 Document Reviewed: 10/22/2006 [facility] Patient Information 2014 [place], [place].   ________________________________________________________________________

## 2019-10-22 ENCOUNTER — Other Ambulatory Visit: Payer: Self-pay

## 2019-10-22 ENCOUNTER — Encounter (HOSPITAL_COMMUNITY): Payer: Medicare PPO

## 2019-10-22 ENCOUNTER — Encounter (HOSPITAL_COMMUNITY)
Admission: RE | Admit: 2019-10-22 | Discharge: 2019-10-22 | Disposition: A | Discharge status: Home / Self Care | Payer: Medicare PPO | Source: Ambulatory Visit | Attending: Orthopedic Surgery | Admitting: Orthopedic Surgery

## 2019-10-22 ENCOUNTER — Encounter (HOSPITAL_COMMUNITY): Payer: Self-pay

## 2019-10-22 DIAGNOSIS — I4510 Unspecified right bundle-branch block: Secondary | ICD-10-CM | POA: Diagnosis not present

## 2019-10-22 DIAGNOSIS — E119 Type 2 diabetes mellitus without complications: Secondary | ICD-10-CM | POA: Diagnosis not present

## 2019-10-22 DIAGNOSIS — I498 Other specified cardiac arrhythmias: Secondary | ICD-10-CM | POA: Diagnosis not present

## 2019-10-22 DIAGNOSIS — R9431 Abnormal electrocardiogram [ECG] [EKG]: Secondary | ICD-10-CM | POA: Insufficient documentation

## 2019-10-22 DIAGNOSIS — Z01818 Encounter for other preprocedural examination: Secondary | ICD-10-CM | POA: Diagnosis not present

## 2019-10-22 HISTORY — DX: Dyspnea, unspecified: R06.00

## 2019-10-22 HISTORY — DX: Anxiety disorder, unspecified: F41.9

## 2019-10-22 HISTORY — DX: Depression, unspecified: F32.A

## 2019-10-22 LAB — CBC WITH DIFFERENTIAL/PLATELET
Abs Immature Granulocytes: 0 10*3/uL (ref 0.00–0.07)
Basophils Absolute: 0 10*3/uL (ref 0.0–0.1)
Basophils Relative: 1 %
Eosinophils Absolute: 0.1 10*3/uL (ref 0.0–0.5)
Eosinophils Relative: 2 %
HCT: 40.4 % (ref 39.0–52.0)
Hemoglobin: 12.9 g/dL — ABNORMAL LOW (ref 13.0–17.0)
Immature Granulocytes: 0 %
Lymphocytes Relative: 31 %
Lymphs Abs: 1.6 10*3/uL (ref 0.7–4.0)
MCH: 30.8 pg (ref 26.0–34.0)
MCHC: 31.9 g/dL (ref 30.0–36.0)
MCV: 96.4 fL (ref 80.0–100.0)
Monocytes Absolute: 0.5 10*3/uL (ref 0.1–1.0)
Monocytes Relative: 10 %
Neutro Abs: 2.8 10*3/uL (ref 1.7–7.7)
Neutrophils Relative %: 56 %
Platelets: 255 10*3/uL (ref 150–400)
RBC: 4.19 MIL/uL — ABNORMAL LOW (ref 4.22–5.81)
RDW: 13.1 % (ref 11.5–15.5)
WBC: 5 10*3/uL (ref 4.0–10.5)
nRBC: 0 % (ref 0.0–0.2)

## 2019-10-22 LAB — COMPREHENSIVE METABOLIC PANEL
ALT: 17 U/L (ref 0–44)
AST: 15 U/L (ref 15–41)
Albumin: 4 g/dL (ref 3.5–5.0)
Alkaline Phosphatase: 69 U/L (ref 38–126)
Anion gap: 7 (ref 5–15)
BUN: 14 mg/dL (ref 8–23)
CO2: 27 mmol/L (ref 22–32)
Calcium: 8.6 mg/dL — ABNORMAL LOW (ref 8.9–10.3)
Chloride: 108 mmol/L (ref 98–111)
Creatinine, Ser: 0.92 mg/dL (ref 0.61–1.24)
GFR calc Af Amer: >60 mL/min (ref >60)
GFR calc non Af Amer: >60 mL/min (ref >60)
Glucose, Bld: 101 mg/dL — ABNORMAL HIGH (ref 70–99)
Potassium: 3.6 mmol/L (ref 3.5–5.1)
Sodium: 142 mmol/L (ref 135–145)
Total Bilirubin: 0.5 mg/dL (ref 0.3–1.2)
Total Protein: 7.1 g/dL (ref 6.5–8.1)

## 2019-10-22 LAB — TYPE AND SCREEN
ABO/RH(D): B POS
Antibody Screen: NEGATIVE

## 2019-10-22 LAB — PROTIME-INR
INR: 1 (ref 0.8–1.2)
Prothrombin Time: 13.2 seconds (ref 11.4–15.2)

## 2019-10-22 LAB — APTT: aPTT: 26 seconds (ref 24–36)

## 2019-10-22 LAB — SURGICAL PCR SCREEN
MRSA, PCR: NEGATIVE
Staphylococcus aureus: NEGATIVE

## 2019-10-22 MED ORDER — LACTATED RINGERS IV SOLN
INTRAVENOUS | Status: DC
  Filled 2019-10-22: qty 1000

## 2019-10-23 LAB — HEMOGLOBIN A1C
Hgb A1c MFr Bld: 6.3 % — ABNORMAL HIGH (ref 4.8–5.6)
Mean Plasma Glucose: 134 mg/dL

## 2019-10-23 LAB — URINE CULTURE: Culture: NO GROWTH

## 2019-10-31 ENCOUNTER — Inpatient Hospital Stay (HOSPITAL_COMMUNITY): Admission: RE | Admit: 2019-10-31 | Payer: TRICARE For Life (TFL) | Source: Ambulatory Visit

## 2019-11-03 ENCOUNTER — Other Ambulatory Visit (HOSPITAL_COMMUNITY)
Admission: RE | Admit: 2019-11-03 | Discharge: 2019-11-03 | Disposition: A | Discharge status: Home / Self Care | Payer: Medicare PPO | Source: Ambulatory Visit | Attending: Orthopedic Surgery | Admitting: Orthopedic Surgery

## 2019-11-03 DIAGNOSIS — Z20822 Contact with and (suspected) exposure to covid-19: Secondary | ICD-10-CM | POA: Insufficient documentation

## 2019-11-03 DIAGNOSIS — Z01812 Encounter for preprocedural laboratory examination: Secondary | ICD-10-CM | POA: Insufficient documentation

## 2019-11-03 LAB — SARS CORONAVIRUS 2 (TAT 6-24 HRS): SARS Coronavirus 2: NEGATIVE

## 2019-11-04 ENCOUNTER — Ambulatory Visit (HOSPITAL_COMMUNITY): Payer: Medicare PPO | Admitting: Anesthesiology

## 2019-11-04 ENCOUNTER — Observation Stay (HOSPITAL_COMMUNITY)
Admission: RE | Admit: 2019-11-04 | Discharge: 2019-11-05 | Disposition: A | Discharge status: Home / Self Care | Payer: Medicare PPO | Source: Ambulatory Visit | Attending: Orthopedic Surgery | Admitting: Orthopedic Surgery

## 2019-11-04 ENCOUNTER — Encounter (HOSPITAL_COMMUNITY): Payer: Self-pay | Admitting: Orthopedic Surgery

## 2019-11-04 ENCOUNTER — Ambulatory Visit (HOSPITAL_COMMUNITY): Payer: Medicare PPO

## 2019-11-04 ENCOUNTER — Other Ambulatory Visit: Payer: Self-pay

## 2019-11-04 ENCOUNTER — Encounter (HOSPITAL_COMMUNITY)
Admission: RE | Disposition: A | Discharge status: Home / Self Care | Payer: Self-pay | Source: Ambulatory Visit | Attending: Orthopedic Surgery

## 2019-11-04 DIAGNOSIS — M1612 Unilateral primary osteoarthritis, left hip: Principal | ICD-10-CM | POA: Insufficient documentation

## 2019-11-04 DIAGNOSIS — Z79899 Other long term (current) drug therapy: Secondary | ICD-10-CM | POA: Insufficient documentation

## 2019-11-04 DIAGNOSIS — M48062 Spinal stenosis, lumbar region with neurogenic claudication: Secondary | ICD-10-CM | POA: Diagnosis not present

## 2019-11-04 DIAGNOSIS — G4733 Obstructive sleep apnea (adult) (pediatric): Secondary | ICD-10-CM | POA: Insufficient documentation

## 2019-11-04 DIAGNOSIS — E119 Type 2 diabetes mellitus without complications: Secondary | ICD-10-CM | POA: Insufficient documentation

## 2019-11-04 DIAGNOSIS — Z419 Encounter for procedure for purposes other than remedying health state, unspecified: Secondary | ICD-10-CM

## 2019-11-04 DIAGNOSIS — Z96642 Presence of left artificial hip joint: Secondary | ICD-10-CM

## 2019-11-04 DIAGNOSIS — Z794 Long term (current) use of insulin: Secondary | ICD-10-CM | POA: Diagnosis not present

## 2019-11-04 DIAGNOSIS — I10 Essential (primary) hypertension: Secondary | ICD-10-CM | POA: Insufficient documentation

## 2019-11-04 DIAGNOSIS — Z8546 Personal history of malignant neoplasm of prostate: Secondary | ICD-10-CM | POA: Diagnosis not present

## 2019-11-04 HISTORY — PX: TOTAL HIP ARTHROPLASTY: SHX124

## 2019-11-04 LAB — GLUCOSE, CAPILLARY
Glucose-Capillary: 78 mg/dL (ref 70–99)
Glucose-Capillary: 93 mg/dL (ref 70–99)
Glucose-Capillary: 135 mg/dL — ABNORMAL HIGH (ref 70–99)
Glucose-Capillary: 171 mg/dL — ABNORMAL HIGH (ref 70–99)

## 2019-11-04 SURGERY — ARTHROPLASTY, HIP, TOTAL, ANTERIOR APPROACH
Anesthesia: General | Site: Hip | Laterality: Left

## 2019-11-04 MED ORDER — HYDROMORPHONE HCL 1 MG/ML IJ SOLN
0.5000 mg | INTRAMUSCULAR | Status: DC | PRN
  Administered 2019-11-04: 0.5 mg via INTRAVENOUS
  Filled 2019-11-04: qty 1

## 2019-11-04 MED ORDER — METOCLOPRAMIDE HCL 5 MG PO TABS: 5.0000 mg | ORAL_TABLET | Freq: Three times a day (TID) | ORAL | Status: DC | PRN

## 2019-11-04 MED ORDER — POVIDONE-IODINE 10 % EX SWAB
2.0000 "application " | Freq: Once | CUTANEOUS | Status: AC
  Administered 2019-11-04: 2 via TOPICAL

## 2019-11-04 MED ORDER — DOCUSATE SODIUM 100 MG PO CAPS
100.0000 mg | ORAL_CAPSULE | Freq: Two times a day (BID) | ORAL | Status: DC
  Administered 2019-11-04 – 2019-11-05 (×2): 100 mg via ORAL
  Filled 2019-11-04 (×2): qty 1

## 2019-11-04 MED ORDER — PHENYLEPHRINE HCL (PRESSORS) 10 MG/ML IV SOLN
INTRAVENOUS | Status: AC
  Filled 2019-11-04: qty 1

## 2019-11-04 MED ORDER — ACETAMINOPHEN 160 MG/5ML PO SOLN: 325.0000 mg | ORAL | Status: DC | PRN

## 2019-11-04 MED ORDER — BISACODYL 10 MG RE SUPP: 10.0000 mg | Freq: Every day | RECTAL | Status: DC | PRN

## 2019-11-04 MED ORDER — WATER FOR IRRIGATION, STERILE IR SOLN
Status: DC | PRN
  Administered 2019-11-04: 1000 mL

## 2019-11-04 MED ORDER — 0.9 % SODIUM CHLORIDE (POUR BTL) OPTIME
TOPICAL | Status: DC | PRN
  Administered 2019-11-04: 1000 mL

## 2019-11-04 MED ORDER — FENTANYL CITRATE (PF) 100 MCG/2ML IJ SOLN
INTRAMUSCULAR | Status: AC
  Filled 2019-11-04: qty 2

## 2019-11-04 MED ORDER — CEFAZOLIN SODIUM-DEXTROSE 2-4 GM/100ML-% IV SOLN
2.0000 g | INTRAVENOUS | Status: AC
  Administered 2019-11-04: 2 g via INTRAVENOUS
  Filled 2019-11-04: qty 100

## 2019-11-04 MED ORDER — ONDANSETRON HCL 4 MG/2ML IJ SOLN
INTRAMUSCULAR | Status: AC
  Filled 2019-11-04: qty 2

## 2019-11-04 MED ORDER — ONDANSETRON HCL 4 MG/2ML IJ SOLN
INTRAMUSCULAR | Status: DC | PRN
  Administered 2019-11-04: 4 mg via INTRAVENOUS

## 2019-11-04 MED ORDER — DEXAMETHASONE SODIUM PHOSPHATE 10 MG/ML IJ SOLN
10.0000 mg | Freq: Once | INTRAMUSCULAR | Status: AC
  Administered 2019-11-05: 10 mg via INTRAVENOUS
  Filled 2019-11-04: qty 1

## 2019-11-04 MED ORDER — PROPOFOL 10 MG/ML IV BOLUS
INTRAVENOUS | Status: DC | PRN
  Administered 2019-11-04: 150 mg via INTRAVENOUS

## 2019-11-04 MED ORDER — PROPOFOL 10 MG/ML IV BOLUS
INTRAVENOUS | Status: AC
  Filled 2019-11-04: qty 20

## 2019-11-04 MED ORDER — SUGAMMADEX SODIUM 200 MG/2ML IV SOLN
INTRAVENOUS | Status: DC | PRN
  Administered 2019-11-04: 200 mg via INTRAVENOUS

## 2019-11-04 MED ORDER — CEFAZOLIN SODIUM-DEXTROSE 2-4 GM/100ML-% IV SOLN
INTRAVENOUS | Status: AC
  Filled 2019-11-04: qty 100

## 2019-11-04 MED ORDER — ORAL CARE MOUTH RINSE: 15.0000 mL | Freq: Once | OROMUCOSAL | Status: AC

## 2019-11-04 MED ORDER — OXYCODONE HCL 5 MG PO TABS
5.0000 mg | ORAL_TABLET | ORAL | Status: DC | PRN
  Administered 2019-11-05 (×2): 5 mg via ORAL
  Filled 2019-11-04 (×2): qty 1

## 2019-11-04 MED ORDER — MENTHOL 3 MG MT LOZG: 1.0000 | LOZENGE | OROMUCOSAL | Status: DC | PRN

## 2019-11-04 MED ORDER — TRANEXAMIC ACID-NACL 1000-0.7 MG/100ML-% IV SOLN
1000.0000 mg | Freq: Once | INTRAVENOUS | Status: AC
  Administered 2019-11-04: 1000 mg via INTRAVENOUS

## 2019-11-04 MED ORDER — ONDANSETRON HCL 4 MG/2ML IJ SOLN: 4.0000 mg | Freq: Once | INTRAMUSCULAR | Status: DC | PRN

## 2019-11-04 MED ORDER — MAGNESIUM CITRATE PO SOLN: 1.0000 | Freq: Once | ORAL | Status: DC | PRN

## 2019-11-04 MED ORDER — POLYETHYLENE GLYCOL 3350 17 G PO PACK: 17.0000 g | PACK | Freq: Every day | ORAL | Status: DC | PRN

## 2019-11-04 MED ORDER — ACETAMINOPHEN 500 MG PO TABS
1000.0000 mg | ORAL_TABLET | Freq: Four times a day (QID) | ORAL | Status: AC
  Administered 2019-11-04 – 2019-11-05 (×4): 1000 mg via ORAL
  Filled 2019-11-04 (×4): qty 2

## 2019-11-04 MED ORDER — ROCURONIUM BROMIDE 10 MG/ML (PF) SYRINGE
PREFILLED_SYRINGE | INTRAVENOUS | Status: AC
  Filled 2019-11-04: qty 10

## 2019-11-04 MED ORDER — BUPIVACAINE LIPOSOME 1.3 % IJ SUSP
10.0000 mL | Freq: Once | INTRAMUSCULAR | Status: AC
  Administered 2019-11-04: 10 mL
  Filled 2019-11-04: qty 10

## 2019-11-04 MED ORDER — CHLORHEXIDINE GLUCONATE 0.12 % MT SOLN
15.0000 mL | Freq: Once | OROMUCOSAL | Status: AC
  Administered 2019-11-04: 15 mL via OROMUCOSAL

## 2019-11-04 MED ORDER — OXYCODONE HCL 5 MG PO TABS: 10.0000 mg | ORAL_TABLET | ORAL | Status: DC | PRN

## 2019-11-04 MED ORDER — CEFAZOLIN SODIUM-DEXTROSE 2-4 GM/100ML-% IV SOLN
2.0000 g | Freq: Four times a day (QID) | INTRAVENOUS | Status: AC
  Administered 2019-11-04 (×2): 2 g via INTRAVENOUS
  Filled 2019-11-04: qty 100

## 2019-11-04 MED ORDER — DEXAMETHASONE SODIUM PHOSPHATE 10 MG/ML IJ SOLN
INTRAMUSCULAR | Status: AC
  Filled 2019-11-04: qty 1

## 2019-11-04 MED ORDER — OXYCODONE HCL 5 MG/5ML PO SOLN: 5.0000 mg | Freq: Once | ORAL | Status: DC | PRN

## 2019-11-04 MED ORDER — IRBESARTAN 150 MG PO TABS
150.0000 mg | ORAL_TABLET | Freq: Every day | ORAL | Status: DC
  Administered 2019-11-05: 150 mg via ORAL
  Filled 2019-11-04: qty 1

## 2019-11-04 MED ORDER — GABAPENTIN 100 MG PO CAPS
100.0000 mg | ORAL_CAPSULE | Freq: Two times a day (BID) | ORAL | Status: DC
  Administered 2019-11-04 – 2019-11-05 (×2): 100 mg via ORAL
  Filled 2019-11-04 (×2): qty 1

## 2019-11-04 MED ORDER — ROCURONIUM BROMIDE 10 MG/ML (PF) SYRINGE
PREFILLED_SYRINGE | INTRAVENOUS | Status: DC | PRN
  Administered 2019-11-04: 60 mg via INTRAVENOUS

## 2019-11-04 MED ORDER — ASPIRIN 81 MG PO CHEW
81.0000 mg | CHEWABLE_TABLET | Freq: Two times a day (BID) | ORAL | Status: DC
  Administered 2019-11-04 – 2019-11-05 (×2): 81 mg via ORAL
  Filled 2019-11-04 (×2): qty 1

## 2019-11-04 MED ORDER — BUPROPION HCL ER (XL) 150 MG PO TB24
150.0000 mg | ORAL_TABLET | Freq: Every day | ORAL | Status: DC
  Administered 2019-11-05: 150 mg via ORAL
  Filled 2019-11-04: qty 1

## 2019-11-04 MED ORDER — ALUM & MAG HYDROXIDE-SIMETH 200-200-20 MG/5ML PO SUSP: 30.0000 mL | ORAL | Status: DC | PRN

## 2019-11-04 MED ORDER — METOPROLOL SUCCINATE ER 50 MG PO TB24
50.0000 mg | ORAL_TABLET | Freq: Every day | ORAL | Status: DC
  Administered 2019-11-05: 50 mg via ORAL
  Filled 2019-11-04: qty 1

## 2019-11-04 MED ORDER — DEXAMETHASONE SODIUM PHOSPHATE 10 MG/ML IJ SOLN
8.0000 mg | Freq: Once | INTRAMUSCULAR | Status: AC
  Administered 2019-11-04: 8 mg via INTRAVENOUS

## 2019-11-04 MED ORDER — ACETAMINOPHEN 325 MG PO TABS: 325.0000 mg | ORAL_TABLET | Freq: Four times a day (QID) | ORAL | Status: DC | PRN

## 2019-11-04 MED ORDER — METHOCARBAMOL 500 MG IVPB - SIMPLE MED
500.0000 mg | Freq: Four times a day (QID) | INTRAVENOUS | Status: DC | PRN
  Filled 2019-11-04: qty 50

## 2019-11-04 MED ORDER — ONDANSETRON HCL 4 MG/2ML IJ SOLN
4.0000 mg | Freq: Four times a day (QID) | INTRAMUSCULAR | Status: DC | PRN
  Administered 2019-11-04: 4 mg via INTRAVENOUS
  Filled 2019-11-04: qty 2

## 2019-11-04 MED ORDER — POTASSIUM CHLORIDE IN NACL 20-0.9 MEQ/L-% IV SOLN
INTRAVENOUS | Status: DC
  Filled 2019-11-04 (×2): qty 1000

## 2019-11-04 MED ORDER — SODIUM CHLORIDE FLUSH 0.9 % IV SOLN
INTRAVENOUS | Status: DC | PRN
  Administered 2019-11-04: 20 mL

## 2019-11-04 MED ORDER — ROSUVASTATIN CALCIUM 20 MG PO TABS
40.0000 mg | ORAL_TABLET | Freq: Every day | ORAL | Status: DC
  Administered 2019-11-04: 40 mg via ORAL
  Filled 2019-11-04: qty 2

## 2019-11-04 MED ORDER — ONDANSETRON HCL 4 MG PO TABS: 4.0000 mg | ORAL_TABLET | Freq: Four times a day (QID) | ORAL | Status: DC | PRN

## 2019-11-04 MED ORDER — POVIDONE-IODINE 10 % EX SWAB: 2.0000 "application " | Freq: Once | CUTANEOUS | Status: DC

## 2019-11-04 MED ORDER — INSULIN ASPART 100 UNIT/ML ~~LOC~~ SOLN
0.0000 [IU] | Freq: Three times a day (TID) | SUBCUTANEOUS | Status: DC
  Administered 2019-11-04: 2 [IU] via SUBCUTANEOUS
  Administered 2019-11-05: 3 [IU] via SUBCUTANEOUS

## 2019-11-04 MED ORDER — METHOCARBAMOL 500 MG PO TABS
500.0000 mg | ORAL_TABLET | Freq: Four times a day (QID) | ORAL | Status: DC | PRN
  Administered 2019-11-04 – 2019-11-05 (×3): 500 mg via ORAL
  Filled 2019-11-04 (×3): qty 1

## 2019-11-04 MED ORDER — FENTANYL CITRATE (PF) 100 MCG/2ML IJ SOLN
INTRAMUSCULAR | Status: DC | PRN
  Administered 2019-11-04: 50 ug via INTRAVENOUS
  Administered 2019-11-04: 100 ug via INTRAVENOUS
  Administered 2019-11-04: 50 ug via INTRAVENOUS

## 2019-11-04 MED ORDER — TRANEXAMIC ACID-NACL 1000-0.7 MG/100ML-% IV SOLN
1000.0000 mg | INTRAVENOUS | Status: AC
  Administered 2019-11-04: 1000 mg via INTRAVENOUS
  Filled 2019-11-04: qty 100

## 2019-11-04 MED ORDER — HYDROCHLOROTHIAZIDE 25 MG PO TABS
25.0000 mg | ORAL_TABLET | Freq: Every day | ORAL | Status: DC
  Administered 2019-11-05: 25 mg via ORAL
  Filled 2019-11-04: qty 1

## 2019-11-04 MED ORDER — ACETAMINOPHEN 325 MG PO TABS: 325.0000 mg | ORAL_TABLET | ORAL | Status: DC | PRN

## 2019-11-04 MED ORDER — SODIUM CHLORIDE (PF) 0.9 % IJ SOLN
INTRAMUSCULAR | Status: AC
  Filled 2019-11-04: qty 50

## 2019-11-04 MED ORDER — SEMAGLUTIDE (1 MG/DOSE) 4 MG/3ML ~~LOC~~ SOPN: 1.0000 mg | PEN_INJECTOR | SUBCUTANEOUS | Status: DC

## 2019-11-04 MED ORDER — FENTANYL CITRATE (PF) 100 MCG/2ML IJ SOLN: 25.0000 ug | INTRAMUSCULAR | Status: DC | PRN

## 2019-11-04 MED ORDER — MEPERIDINE HCL 50 MG/ML IJ SOLN: 6.2500 mg | INTRAMUSCULAR | Status: DC | PRN

## 2019-11-04 MED ORDER — LACTATED RINGERS IV SOLN: INTRAVENOUS | Status: DC

## 2019-11-04 MED ORDER — PHENYLEPHRINE HCL-NACL 10-0.9 MG/250ML-% IV SOLN
INTRAVENOUS | Status: DC | PRN
Start: 2019-11-04 — End: 2019-11-04
  Administered 2019-11-04: 20 ug/min via INTRAVENOUS

## 2019-11-04 MED ORDER — DULOXETINE HCL 60 MG PO CPEP
60.0000 mg | ORAL_CAPSULE | Freq: Every day | ORAL | Status: DC
  Administered 2019-11-05: 60 mg via ORAL
  Filled 2019-11-04: qty 1

## 2019-11-04 MED ORDER — OXYCODONE HCL 5 MG PO TABS: 5.0000 mg | ORAL_TABLET | Freq: Once | ORAL | Status: DC | PRN

## 2019-11-04 MED ORDER — INSULIN GLARGINE 100 UNIT/ML ~~LOC~~ SOLN
26.0000 [IU] | Freq: Every day | SUBCUTANEOUS | Status: DC
  Administered 2019-11-04: 26 [IU] via SUBCUTANEOUS
  Filled 2019-11-04: qty 0.26

## 2019-11-04 MED ORDER — METOCLOPRAMIDE HCL 5 MG/ML IJ SOLN: 5.0000 mg | Freq: Three times a day (TID) | INTRAMUSCULAR | Status: DC | PRN

## 2019-11-04 MED ORDER — PANTOPRAZOLE SODIUM 40 MG PO TBEC
40.0000 mg | DELAYED_RELEASE_TABLET | Freq: Every day | ORAL | Status: DC
  Administered 2019-11-05: 40 mg via ORAL
  Filled 2019-11-04: qty 1

## 2019-11-04 MED ORDER — PHENOL 1.4 % MT LIQD: 1.0000 | OROMUCOSAL | Status: DC | PRN

## 2019-11-04 MED ORDER — POVIDONE-IODINE 7.5 % EX SOLN: Freq: Once | CUTANEOUS | Status: DC

## 2019-11-04 MED ORDER — TRANEXAMIC ACID-NACL 1000-0.7 MG/100ML-% IV SOLN
INTRAVENOUS | Status: AC
  Filled 2019-11-04: qty 100

## 2019-11-04 MED ORDER — AMLODIPINE BESYLATE 10 MG PO TABS
10.0000 mg | ORAL_TABLET | Freq: Every day | ORAL | Status: DC
  Administered 2019-11-05: 10 mg via ORAL
  Filled 2019-11-04: qty 1

## 2019-11-04 MED ORDER — LIDOCAINE 2% (20 MG/ML) 5 ML SYRINGE
INTRAMUSCULAR | Status: AC
  Filled 2019-11-04: qty 5

## 2019-11-04 MED ORDER — LIDOCAINE 2% (20 MG/ML) 5 ML SYRINGE
INTRAMUSCULAR | Status: DC | PRN
  Administered 2019-11-04: 100 mg via INTRAVENOUS

## 2019-11-04 SURGICAL SUPPLY — 43 items
APL PRP STRL LF DISP 70% ISPRP (MISCELLANEOUS) ×1
BAG SPEC THK2 15X12 ZIP CLS (MISCELLANEOUS)
BAG ZIPLOCK 12X15 (MISCELLANEOUS) IMPLANT
BLADE SAG 18X100X1.27 (BLADE) ×3 IMPLANT
BLADE SURG SZ10 CARB STEEL (BLADE) ×6 IMPLANT
CHLORAPREP W/TINT 26 (MISCELLANEOUS) ×3 IMPLANT
CLOSURE STERI-STRIP 1/2X4 (GAUZE/BANDAGES/DRESSINGS) ×1
CLSR STERI-STRIP ANTIMIC 1/2X4 (GAUZE/BANDAGES/DRESSINGS) ×2 IMPLANT
COVER PERINEAL POST (MISCELLANEOUS) ×3 IMPLANT
COVER SURGICAL LIGHT HANDLE (MISCELLANEOUS) ×3 IMPLANT
COVER WAND RF STERILE (DRAPES) IMPLANT
DECANTER SPIKE VIAL GLASS SM (MISCELLANEOUS) ×6 IMPLANT
DRAPE IMP U-DRAPE 54X76 (DRAPES) ×3 IMPLANT
DRAPE STERI IOBAN 125X83 (DRAPES) ×3 IMPLANT
DRAPE U-SHAPE 47X51 STRL (DRAPES) ×6 IMPLANT
DRSG MEPILEX BORDER 4X8 (GAUZE/BANDAGES/DRESSINGS) ×3 IMPLANT
ELECT BLADE TIP CTD 4 INCH (ELECTRODE) ×3 IMPLANT
ELECT REM PT RETURN 15FT ADLT (MISCELLANEOUS) ×3 IMPLANT
GLOVE BIO SURGEON STRL SZ7.5 (GLOVE) ×6 IMPLANT
GLOVE BIOGEL PI IND STRL 8 (GLOVE) ×2 IMPLANT
GLOVE BIOGEL PI INDICATOR 8 (GLOVE) ×4
GOWN STRL REUS W/TWL LRG LVL3 (GOWN DISPOSABLE) ×3 IMPLANT
GOWN STRL REUS W/TWL XL LVL3 (GOWN DISPOSABLE) ×3 IMPLANT
HEAD BIOLOX HIP 36/-5 (Joint) IMPLANT
HIP BIOLOX HD 36/-5 (Joint) ×3 IMPLANT
INSERT TRIDENT POLY 36 0DEG (Insert) ×2 IMPLANT
KIT TURNOVER KIT A (KITS) IMPLANT
MANIFOLD NEPTUNE II (INSTRUMENTS) ×3 IMPLANT
NS IRRIG 1000ML POUR BTL (IV SOLUTION) ×3 IMPLANT
PACK ANTERIOR HIP CUSTOM (KITS) ×3 IMPLANT
PROTECTOR NERVE ULNAR (MISCELLANEOUS) ×3 IMPLANT
SCREW HEX LP 6.5X20 (Screw) ×2 IMPLANT
SHELL CLUSTERHOLE ACETABULAR 5 (Shell) ×2 IMPLANT
STEM HIP 4 127DEG (Stem) ×2 IMPLANT
SUT MNCRL AB 4-0 PS2 18 (SUTURE) ×3 IMPLANT
SUT STRATAFIX 0 PDS 27 VIOLET (SUTURE) ×3
SUT VIC AB 0 CT1 36 (SUTURE) ×3 IMPLANT
SUT VIC AB 1 CT1 36 (SUTURE) ×3 IMPLANT
SUT VIC AB 2-0 CT1 27 (SUTURE) ×6
SUT VIC AB 2-0 CT1 TAPERPNT 27 (SUTURE) ×2 IMPLANT
SUTURE STRATFX 0 PDS 27 VIOLET (SUTURE) ×1 IMPLANT
WATER STERILE IRR 1000ML POUR (IV SOLUTION) ×6 IMPLANT
YANKAUER SUCT BULB TIP 10FT TU (MISCELLANEOUS) ×3 IMPLANT

## 2019-11-04 NOTE — Interval H&P Note (Signed)
History and Physical Interval Note:  11/04/2019 9:11 AM  Dean Brown  has presented today for surgery, with the diagnosis of OA LEFT HIP.  The various methods of treatment have been discussed with the patient and family. After consideration of risks, benefits and other options for treatment, the patient has consented to  Procedure(s): TOTAL HIP ARTHROPLASTY ANTERIOR APPROACH (Left) as a surgical intervention.  The patient's history has been reviewed, patient examined, no change in status, stable for surgery.  I have reviewed the patient's chart and labs.  Questions were answered to the patient's satisfaction.     Renette Butters  I participated in the care of this patient and agree with the above history, physical and evaluation. I performed a review of the history and a physical exam as detailed   Carole Binning MD

## 2019-11-04 NOTE — Evaluation (Signed)
Physical Therapy Evaluation Patient Details Name: Dean Brown MRN: [id number] DOB: 1945/12/02 Today's Date: 11/04/2019   History of Present Illness  Patient is 74 y.o. male s/p Lt THA anterior approach on 11/04/19 with PMH significant for OA, HTN, GERD, DM, Prostate cancer, anxiety, Lt RCR, L5-S1 decompression in 2013.    Clinical Impression  Dean Brown is a 74 y.o. male POD 0 s/p Lt THA. Patient reports independence with mobility at baseline. Patient is now limited by functional impairments (see PT problem list below) and requires min assist for transfers and gait with RW. Patient was able to ambulate ~6 feet with RW and min assist and was limited by orthostatsis, co nausea, dizziness. He experienced one episode of emesis after returning to sit. After seated rest pt's BP stable at 134/78. Patient will benefit from continued skilled PT interventions to address impairments and progress towards PLOF. Acute PT will follow to progress mobility and stair training in preparation for safe discharge home.     Follow Up Recommendations Follow surgeon's recommendation for DC plan and follow-up therapies;Outpatient PT    Equipment Recommendations  None recommended by PT    Recommendations for Other Services       Precautions / Restrictions Precautions Precautions: Fall Restrictions Weight Bearing Restrictions: No Other Position/Activity Restrictions: WAT      Mobility  Bed Mobility Overal bed mobility: Needs Assistance Bed Mobility: Supine to Sit     Supine to sit: Min assist;HOB elevated     General bed mobility comments: cues for sequencing with use of bed rail and assist to bring Lt LE to EOB and raise trunk. pt slow to mobilize.  Transfers Overall transfer level: Needs assistance Equipment used: Rolling walker (2 wheeled) Transfers: Sit to/from Stand Sit to Stand: Min assist;From elevated surface         General transfer comment: cues for safe techqnique with RW and  assist to steady with power up.  Ambulation/Gait Ambulation/Gait assistance: Min assist Gait Distance (Feet): 6 Feet Assistive device: Rolling walker (2 wheeled) Gait Pattern/deviations: Step-to pattern;Decreased stride length;Decreased stance time - left;Decreased step length - left;Decreased step length - right;Decreased weight shift to left Gait velocity: decr   General Gait Details: pt required cues for safe step pattern with RW and assist to manage position. pt c/o dizziness and weak feeling with short gait. returned to sit in recliner and BP assessed. BP 114/69 mmHg. After seated rest BP returned to 134/78 mmHg.  Stairs         Wheelchair Mobility    Modified Rankin (Stroke Patients Only)       Balance Overall balance assessment: Needs assistance Sitting-balance support: Feet supported Sitting balance-Leahy Scale: Good     Standing balance support: During functional activity;Bilateral upper extremity supported Standing balance-Leahy Scale: Poor                Pertinent Vitals/Pain Pain Assessment: Faces Faces Pain Scale: Hurts even more Pain Location: Lt hip Pain Descriptors / Indicators: Aching;Discomfort;Grimacing;Guarding Pain Intervention(s): Limited activity within patient's tolerance;Monitored during session;Repositioned;Ice applied (pt limited by nausea, zofran given prior to session)    Home Living Family/patient expects to be discharged to:: Private residence Living Arrangements: Spouse/significant other Available Help at Discharge: Family Type of Home: House Home Access: Stairs to enter Entrance Stairs-Rails: Can reach both Entrance Stairs-Number of Steps: Eddy: One [place]: Environmental consultant - 2 wheels;Bedside commode;Cane - single point;Shower seat      Prior Function Level of Independence: Independent  Comments: retired Horticulturist, commercial at [employer].     Hand Dominance   Dominant Hand: Right    Extremity/Trunk  Assessment   Upper Extremity Assessment Upper Extremity Assessment: Overall WFL for tasks assessed    Lower Extremity Assessment Lower Extremity Assessment: Overall WFL for tasks assessed    Cervical / Trunk Assessment Cervical / Trunk Assessment: Normal  Communication   Communication: No difficulties  Cognition Arousal/Alertness: Lethargic Behavior During Therapy: WFL for tasks assessed/performed Overall Cognitive Status: Within Functional Limits for tasks assessed        General Comments: pt slightly lethargic but easily alerted with conversation.      General Comments General comments (skin integrity, edema, etc.): pt c/o of dizziness/lightheadedness and nausea with mobility. 1 episode of emesis after returning to sit.     Exercises     Assessment/Plan    PT Assessment Patient needs continued PT services  PT Problem List Decreased strength;Decreased range of motion;Decreased balance;Decreased activity tolerance;Decreased mobility;Decreased knowledge of use of DME;Decreased knowledge of precautions;Pain       PT Treatment Interventions DME instruction;Gait training;Stair training;Functional mobility training;Therapeutic exercise;Therapeutic activities;Balance training;Patient/family education    PT Goals (Current goals can be found in the Care Plan section)  Acute Rehab PT Goals Patient Stated Goal: get back to independence and activities he did when he was younger PT Goal Formulation: With patient Time For Goal Achievement: 11/11/19 Potential to Achieve Goals: Good    Frequency 7X/week    AM-PAC PT "6 Clicks" Mobility  Outcome Measure Help needed turning from your back to your side while in a flat bed without using bedrails?: A Little Help needed moving from lying on your back to sitting on the side of a flat bed without using bedrails?: A Little Help needed moving to and from a bed to a chair (including a wheelchair)?: A Little Help needed standing up from a  chair using your arms (e.g., wheelchair or bedside chair)?: A Little Help needed to walk in hospital room?: A Little Help needed climbing 3-5 steps with a railing? : A Lot 6 Click Score: 17    End of Session Equipment Utilized During Treatment: Gait belt Activity Tolerance: Patient limited by lethargy;Patient limited by pain;Treatment limited secondary to medical complications (Comment) (limited by nausea c/o of dizziness) Patient left: in chair;with call bell/phone within reach;with chair alarm set;with family/visitor present Nurse Communication: Mobility status PT Visit Diagnosis: Muscle weakness (generalized) (M62.81);Difficulty in walking, not elsewhere classified (R26.2)    Time: 1730-1800 PT Time Calculation (min) (ACUTE ONLY): 30 min   Charges:   PT Evaluation $PT Eval Low Complexity: 1 Low PT Treatments $Therapeutic Activity: 8-22 mins        Verner Mould, DPT Acute Rehabilitation Services  Office [phone] Pager [phone]  11/04/2019 6:15 PM

## 2019-11-04 NOTE — Anesthesia Postprocedure Evaluation (Signed)
Anesthesia Post Note  Patient: Dean Brown  Procedure(s) Performed: TOTAL HIP ARTHROPLASTY ANTERIOR APPROACH (Left Hip)     Patient location during evaluation: PACU Anesthesia Type: General Level of consciousness: awake and alert Pain management: pain level controlled Vital Signs Assessment: post-procedure vital signs reviewed and stable Respiratory status: spontaneous breathing, nonlabored ventilation, respiratory function stable and patient connected to nasal cannula oxygen Cardiovascular status: blood pressure returned to baseline and stable Postop Assessment: no apparent nausea or vomiting Anesthetic complications: no   No complications documented.  Last Vitals:  Vitals:   11/04/19 1245 11/04/19 1300  BP: (!) 161/93 (!) 154/93  Pulse: 82 76  Resp: 18 18  Temp:    SpO2: 100% 100%    Last Pain:  Vitals:   11/04/19 1300  TempSrc:   PainSc: Asleep                 Amauri Medellin

## 2019-11-04 NOTE — Plan of Care (Signed)

## 2019-11-04 NOTE — Transfer of Care (Signed)
Immediate Anesthesia Transfer of Care Note  Patient: Dean Brown  Procedure(s) Performed: TOTAL HIP ARTHROPLASTY ANTERIOR APPROACH (Left Hip)  Patient Location: PACU  Anesthesia Type:General  Level of Consciousness: drowsy  Airway & Oxygen Therapy: Patient Spontanous Breathing and Patient connected to face mask oxygen  Post-op Assessment: Report given to RN and Post -op Vital signs reviewed and stable  Post vital signs: Reviewed and stable  Last Vitals:  Vitals Value Taken Time  BP 159/95 11/04/19 1224  Temp    Pulse 78 11/04/19 1226  Resp 16 11/04/19 1226  SpO2 100 % 11/04/19 1226  Vitals shown include unvalidated device data.  Last Pain:  Vitals:   11/04/19 0855  TempSrc: Oral         Complications: No complications documented.

## 2019-11-04 NOTE — Anesthesia Procedure Notes (Signed)
Procedure Name: Intubation Date/Time: 11/04/2019 10:18 AM Performed by: Montel Clock, CRNA Pre-anesthesia Checklist: Patient identified, Emergency Drugs available, Suction available, Patient being monitored and Timeout performed Patient Re-evaluated:Patient Re-evaluated prior to induction Oxygen Delivery Method: Circle system utilized Preoxygenation: Pre-oxygenation with 100% oxygen Induction Type: IV induction Ventilation: Mask ventilation without difficulty and Oral airway inserted - appropriate to patient size Laryngoscope Size: Mac and 3 Grade View: Grade II Tube type: Oral Tube size: 7.5 mm Number of attempts: 1 Airway Equipment and Method: Stylet Placement Confirmation: ETT inserted through vocal cords under direct vision,  positive ETCO2 and breath sounds checked- equal and bilateral Secured at: 23 cm Tube secured with: Tape Dental Injury: Teeth and Oropharynx as per pre-operative assessment

## 2019-11-04 NOTE — Anesthesia Preprocedure Evaluation (Addendum)
Anesthesia Evaluation  Patient identified by MRN, date of birth, ID band Patient awake    Reviewed: Allergy & Precautions, H&P , NPO status , Patient's Chart, lab work & pertinent test results  Airway Mallampati: I  TM Distance: >3 FB Neck ROM: Full    Dental no notable dental hx.    Pulmonary sleep apnea , former smoker,    Pulmonary exam normal breath sounds clear to auscultation       Cardiovascular hypertension, Pt. on medications and Pt. on home beta blockers Normal cardiovascular exam Rhythm:Regular Rate:Normal     Neuro/Psych    GI/Hepatic GERD  Controlled and Medicated,  Endo/Other  diabetes, Well Controlled, Type 2, Oral Hypoglycemic Agents, Insulin Dependent  Renal/GU CRF Cr 1.65     Musculoskeletal   Abdominal   Peds  Hematology   Anesthesia Other Findings   Reproductive/Obstetrics                            Anesthesia Physical  Anesthesia Plan  ASA: III  Anesthesia Plan: General   Post-op Pain Management:    Induction: Intravenous  PONV Risk Score and Plan: 2 and Ondansetron  Airway Management Planned: Oral ETT and LMA  Additional Equipment:   Intra-op Plan:   Post-operative Plan: Extubation in OR  Informed Consent: I have reviewed the patients History and Physical, chart, labs and discussed the procedure including the risks, benefits and alternatives for the proposed anesthesia with the patient or authorized representative who has indicated his/her understanding and acceptance.       Plan Discussed with: CRNA, Surgeon and Anesthesiologist  Anesthesia Plan Comments: (H/o Spinal stenosis of lumbar region with neurogenic claudication.  Will review MRI.  )       Anesthesia Quick Evaluation

## 2019-11-04 NOTE — Op Note (Signed)
11/04/2019  11:37 AM  PATIENT:  Dean Brown   MRN: [id number]  PRE-OPERATIVE DIAGNOSIS:  OA LEFT HIP  POST-OPERATIVE DIAGNOSIS:  OA LEFT HIP  PROCEDURE:  Procedure(s): TOTAL HIP ARTHROPLASTY ANTERIOR APPROACH  PREOPERATIVE INDICATIONS:    Dean Brown is an 74 y.o. male who has a diagnosis of Primary localized osteoarthritis of left hip and elected for surgical management after failing conservative treatment.  The risks benefits and alternatives were discussed with the patient including but not limited to the risks of nonoperative treatment, versus surgical intervention including infection, bleeding, nerve injury, periprosthetic fracture, the need for revision surgery, dislocation, leg length discrepancy, blood clots, cardiopulmonary complications, morbidity, mortality, among others, and they were willing to proceed.     OPERATIVE REPORT     SURGEON:   Renette Butters, MD    ASSISTANT:  Nehemiah Massed, PA-C, he was present and scrubbed throughout the case, critical for completion in a timely fashion, and for retraction, instrumentation, and closure.     ANESTHESIA:  General    COMPLICATIONS:  None.     COMPONENTS:  Stryker acolade fit femur size 4 with a 36 mm -5 head ball and an acetabular shell size 52 with a  polyethylene liner    PROCEDURE IN DETAIL:   The patient was met in the holding area and  identified.  The appropriate hip was identified and marked at the operative site.  The patient was then transported to the OR  and  placed under anesthesia per that record.  At that point, the patient was  placed in the supine position and  secured to the operating room table and all bony prominences padded. He received pre-operative antibiotics    The operative lower extremity was prepped from the iliac crest to the distal leg.  Sterile draping was performed.  Time out was performed prior to incision.      Skin incision was made just 2 cm lateral to the ASIS  extending in line  with the tensor fascia lata. Electrocautery was used to control all bleeders. I dissected down sharply to the fascia of the tensor fascia lata was confirmed that the muscle fibers beneath were running posteriorly. I then incised the fascia over the superficial tensor fascia lata in line with the incision. The fascia was elevated off the anterior aspect of the muscle the muscle was retracted posteriorly and protected throughout the case. I then used electrocautery to incise the tensor fascia lata fascia control and all bleeders. Immediately visible was the fat over top of the anterior neck and capsule.  I removed the anterior fat from the capsule and elevated the rectus muscle off of the anterior capsule. I then removed a large time of capsule. The retractors were then placed over the anterior acetabulum as well as around the superior and inferior neck.  I then made a femoral neck cut. Then used the power corkscrew to remove the femoral head from the acetabulum and thoroughly irrigated the acetabulum. I sized the femoral head.    I then exposed the deep acetabulum, cleared out any tissue including the ligamentum teres.   After adequate visualization, I excised the labrum, and then sequentially reamed.  I then impacted the acetabular implant into place using fluoroscopy for guidance.  Appropriate version and inclination was confirmed clinically matching their bony anatomy, and with fluoroscopy.  I placed a 20 mm screw in the posterior/superio position with an excellent bite.    I then placed the polyethylene  liner in place  I then adducted the leg and released the external rotators from the posterior femur allowing it to be easily delivered up lateral and anterior to the acetabulum for preparation of the femoral canal.    I then prepared the proximal femur using the cookie-cutter and then sequentially reamed and broached.  A trial broach, neck, and head was utilized, and I reduced the hip and used  floroscopy to assess the neck length and femoral implant.  I then impacted the femoral prosthesis into place into the appropriate version. The hip was then reduced and fluoroscopy confirmed appropriate position. Leg lengths were restored.  I then irrigated the hip copiously again with, and repaired the fascia with Vicryl, followed by monocryl for the subcutaneous tissue, Monocryl for the skin, Steri-Strips and sterile gauze. The patient was then awakened and returned to PACU in stable and satisfactory condition. There were no complications.  POST OPERATIVE PLAN: WBAT, DVT px: SCD's/TED, ambulation and chemical dvt px  Dean Lynch, MD Orthopedic Surgeon [phone]

## 2019-11-05 ENCOUNTER — Encounter (HOSPITAL_COMMUNITY): Payer: Self-pay | Admitting: Orthopedic Surgery

## 2019-11-05 DIAGNOSIS — M1612 Unilateral primary osteoarthritis, left hip: Secondary | ICD-10-CM | POA: Diagnosis not present

## 2019-11-05 LAB — GLUCOSE, CAPILLARY
Glucose-Capillary: 90 mg/dL (ref 70–99)
Glucose-Capillary: 186 mg/dL — ABNORMAL HIGH (ref 70–99)

## 2019-11-05 MED ORDER — OXYCODONE HCL 5 MG PO TABS: 5.0000 mg | ORAL_TABLET | ORAL | 0 refills | Status: DC | PRN

## 2019-11-05 MED ORDER — ASPIRIN 81 MG PO CHEW: 81.0000 mg | CHEWABLE_TABLET | Freq: Two times a day (BID) | ORAL | 0 refills | Status: AC

## 2019-11-05 MED ORDER — ONDANSETRON HCL 4 MG PO TABS: 4.0000 mg | ORAL_TABLET | Freq: Four times a day (QID) | ORAL | 0 refills | Status: DC | PRN

## 2019-11-05 NOTE — Plan of Care (Signed)
  Problem: Education: Goal: Knowledge of General Education information will improve Description: Including pain rating scale, medication(s)/side effects and non-pharmacologic comfort measures Outcome: Progressing   Problem: Activity: Goal: Risk for activity intolerance will decrease Outcome: Progressing   Problem: Pain Managment: Goal: General experience of comfort will improve Outcome: Progressing   Problem: Education: Goal: Knowledge of the prescribed therapeutic regimen will improve Outcome: Progressing Goal: Understanding of discharge needs will improve Outcome: Progressing Goal: Individualized Educational Video(s) Outcome: Progressing

## 2019-11-05 NOTE — Progress Notes (Signed)
Physical Therapy Treatment Patient Details Name: Dean Brown MRN: [id number] DOB: November 26, 1945 Today's Date: 11/05/2019    History of Present Illness Patient is 74 y.o. male s/p Lt THA anterior approach on 11/04/19 with PMH significant for OA, HTN, GERD, DM, Prostate cancer, anxiety, Lt RCR, L5-S1 decompression in 2013.    PT Comments    Progressing well with mobility. Will plan to have a 2nd session prior to possible d/c home later today.    Follow Up Recommendations  Follow surgeons recommendation for DC plan and follow-up therapies     Equipment Recommendations  None recommended by PT    Recommendations for Other Services       Precautions / Restrictions Precautions Precautions: Fall Restrictions Weight Bearing Restrictions: No Other Position/Activity Restrictions: WBAT    Mobility  Bed Mobility               General bed mobility comments: oob in recliner  Transfers Overall transfer level: Needs assistance Equipment used: Rolling walker (2 wheeled) Transfers: Sit to/from Stand Sit to Stand: Min assist         General transfer comment: Increased time. VCs safety, technique,hand placement. Assist to rise  Ambulation/Gait Ambulation/Gait assistance: Min guard Gait Distance (Feet): 125 Feet Assistive device: Rolling walker (2 wheeled) Gait Pattern/deviations: Step-through pattern;Decreased stride length     General Gait Details: Min guard for safety. VCs safety, sequence. Pt denied lightheadedness.   Stairs             Wheelchair Mobility    Modified Rankin (Stroke Patients Only)       Balance Overall balance assessment: Needs assistance         Standing balance support: Bilateral upper extremity supported Standing balance-Leahy Scale: Fair                              Cognition Arousal/Alertness: Awake/alert Behavior During Therapy: WFL for tasks assessed/performed Overall Cognitive Status: Within Functional Limits  for tasks assessed                                        Exercises Total Joint Exercises Ankle Circles/Pumps: AROM;Both;10 reps Quad Sets: AROM;Both;10 reps Heel Slides: AAROM;Left;10 reps Hip ABduction/ADduction: AAROM;Left;10 reps    General Comments        Pertinent Vitals/Pain Pain Assessment: 0-10 Pain Score: 5  Pain Location: L hip/thigh Pain Descriptors / Indicators: Discomfort;Sore Pain Intervention(s): Limited activity within patient's tolerance;Monitored during session;Ice applied    Home Living                      Prior Function            PT Goals (current goals can now be found in the care plan section) Progress towards PT goals: Progressing toward goals    Frequency    7X/week      PT Plan Current plan remains appropriate    Co-evaluation              AM-PAC PT "6 Clicks" Mobility   Outcome Measure  Help needed turning from your back to your side while in a flat bed without using bedrails?: A Little Help needed moving from lying on your back to sitting on the side of a flat bed without using bedrails?: A Little Help needed moving to and from a  bed to a chair (including a wheelchair)?: A Little Help needed standing up from a chair using your arms (e.g., wheelchair or bedside chair)?: A Little Help needed to walk in hospital room?: A Little Help needed climbing 3-5 steps with a railing? : A Little 6 Click Score: 18    End of Session Equipment Utilized During Treatment: Gait belt Activity Tolerance: Patient tolerated treatment well Patient left: in chair;with call bell/phone within reach   PT Visit Diagnosis: Other abnormalities of gait and mobility (R26.89)     Time: 3692-2300 PT Time Calculation (min) (ACUTE ONLY): 27 min  Charges:  $Gait Training: 8-22 mins $Therapeutic Exercise: 8-22 mins                        Doreatha Massed, PT Acute Rehabilitation  Office: [phone] Pager: [phone]

## 2019-11-05 NOTE — TOC Transition Note (Signed)
Transition of Care (TOC) - CM/SW Discharge Note   Patient Details  Name: Dean Brown MRN: [id number] Date of Birth: 07/09/1945  Transition of Care (TOC) CM/SW Contact:  Nicole A Sinclair, LCSW Phone Number: 11/05/2019, 11:21 AM   Clinical Narrative:    CSW met with patient at bedside to discuss HHPT. Patient agreeable to [facility] for physical therapy. Patient understands the home health agency will contact him prior to the first visit. Patient confirm he has equipment.    Final next level of care: Home w Home Health Services (Home Health PT then OPPT) Barriers to Discharge: Barriers Resolved   Patient Goals and CMS Choice   CMS Medicare.gov Compare Post Acute Care list provided to:: Patient Choice offered to / list presented to : Patient  Discharge Placement                       Discharge Plan and Services                DME Arranged: N/A DME Agency: NA       HH Arranged: PT HH Agency: [facility] Date HH Agency Contacted: 11/05/19 Time HH Agency Contacted: 1002 Representative spoke with at HH Agency: Cindie  Social Determinants of Health (SDOH) Interventions     Readmission Risk Interventions No flowsheet data found.     

## 2019-11-05 NOTE — Progress Notes (Signed)
    Subjective: Patient reports pain as moderate to severe.  Tolerating diet.  Urinating.  +Flatus.  No CP, SOB.  Progressing with PT OOB.  Objective:   VITALS:   Vitals:   11/04/19 1936 11/04/19 2226 11/05/19 0155 11/05/19 0443  BP:  (!) 159/88 (!) 152/83 (!) 145/79  Pulse: 72 75 70 66  Resp: 16 16 16 20   Temp:  98 F (36.7 C) 98.1 F (36.7 C) 98.1 F (36.7 C)  TempSrc:  Oral Oral Oral  SpO2: 97% 100% 99% 100%  Weight:      Height:       CBC Latest Ref Rng & Units 10/22/2019 09/24/2018 09/23/2018  WBC 4.0 - 10.5 K/uL 5.0 7.3 7.2  Hemoglobin 13.0 - 17.0 g/dL 12.9(L) 12.2(L) 14.1  Hematocrit 39 - 52 % 40.4 39.3 43.8  Platelets 150 - 400 K/uL 255 219 252   BMP Latest Ref Rng & Units 10/22/2019 09/24/2018 09/23/2018  Glucose 70 - 99 mg/dL 101(H) 231(H) 113(H)  BUN 8 - 23 mg/dL 14 14 14   Creatinine 0.61 - 1.24 mg/dL 0.92 0.95 0.95  Sodium 135 - 145 mmol/L 142 138 141  Potassium 3.5 - 5.1 mmol/L 3.6 3.4(L) 3.6  Chloride 98 - 111 mmol/L 108 104 103  CO2 22 - 32 mmol/L 27 25 28   Calcium 8.9 - 10.3 mg/dL 8.6(L) 8.4(L) 9.5   Intake/Output      07/13 0701 - 07/14 0700 07/14 0701 - 07/15 0700   P.O. 120    I.V. (mL/kg) 1918.6 (24)    IV Piggyback 300    Total Intake(mL/kg) 2338.6 (29.3)    Urine (mL/kg/hr) 825 400 (2.2)   Emesis/NG output 0    Stool 0    Blood 200    Total Output 1025 400   Net +1313.6 -400           Physical Exam: General: NAD.  Alert and oriented Resp: No increased wob Cardio: regular rate and rhythm ABD soft Neurologically intact MSK Neurovascularly intact Sensation intact distally Intact pulses distally Dorsiflexion/Plantar flexion intact Incision: dressing C/D/I Sensation intact distally Intact pulses distally Dorsiflexion/Plantar flexion intact Incision: dressing C/D/I and no drainage Compartment soft  Assessment: 1 Day Post-Op  S/P Procedure(s) (LRB): TOTAL HIP ARTHROPLASTY ANTERIOR APPROACH (Left) by Dr. Ernesta Amble. Percell Miller on  11/04/19  Principal Problem:   Primary localized osteoarthritis of left hip Active Problems:   BP (high blood pressure)   Obstructive apnea   Spinal stenosis of lumbar region with neurogenic claudication   DM2 (diabetes mellitus, type 2) ([place])   Essential hypertension   Status post total hip replacement, left  ADDITIONAL DIAGNOSIS:   none  Primary osteoarthritis, status post total hip arthroplasty   Plan: Advance diet Up with therapy Discharge home with home health Incentive Spirometry Apply ice   Weight Bearing: Weight Bearing as Tolerated (WBAT) left lower extremity Dressings: Maintain Mepilex.   VTE prophylaxis: Aspirin, SCDs, ambulation Dispo: [place], [place] [phone] 11/05/2019, 9:19 AM Patient ID: Dean Brown, male   DOB: 09-26-1945, 74 y.o.   MRN: [id number]

## 2019-11-05 NOTE — Progress Notes (Signed)
Physical Therapy Treatment Patient Details Name: Dean Brown MRN: [id number] DOB: 09/08/1945 Today's Date: 11/05/2019    History of Present Illness Patient is 74 y.o. male s/p Lt THA anterior approach on 11/04/19 with PMH significant for OA, HTN, GERD, DM, Prostate cancer, anxiety, Lt RCR, L5-S1 decompression in 2013.    PT Comments    Progressing well with mobility. Reviewed/practiced gait and stair training. All education completed. Okay to d/c from PT standpoint.    Follow Up Recommendations  Follow surgeon's recommendation for DC plan and follow-up therapies     Equipment Recommendations  None recommended by PT    Recommendations for Other Services       Precautions / Restrictions Precautions Precautions: Fall Restrictions Weight Bearing Restrictions: No Other Position/Activity Restrictions: WBAT    Mobility  Bed Mobility               General bed mobility comments: oob in recliner  Transfers Overall transfer level: Needs assistance Equipment used: Rolling walker (2 wheeled) Transfers: Sit to/from Stand Sit to Stand: Min guard         General transfer comment: Increased time. VCs safety, technique,hand placement.  Ambulation/Gait Ambulation/Gait assistance: Min guard Gait Distance (Feet): 250 Feet Assistive device: Rolling walker (2 wheeled) Gait Pattern/deviations: Step-through pattern;Decreased stride length     General Gait Details: Min guard for safety. VCs safety, sequence.   Stairs Stairs: Yes Stairs assistance: Min guard Stair Management: Step to pattern;Forwards;One rail Left Number of Stairs: 6 General stair comments: Up and over gym stairs x 2. VCs safety, technique, sequence   Wheelchair Mobility    Modified Rankin (Stroke Patients Only)       Balance Overall balance assessment: Mild deficits observed, not formally tested         Standing balance support: Bilateral upper extremity supported Standing balance-Leahy  Scale: Fair                              Cognition Arousal/Alertness: Awake/alert Behavior During Therapy: WFL for tasks assessed/performed Overall Cognitive Status: Within Functional Limits for tasks assessed                                        Exercises Total Joint Exercises Ankle Circles/Pumps: AROM;Both;10 reps Quad Sets: AROM;Both;10 reps Heel Slides: AAROM;Left;10 reps Hip ABduction/ADduction: AAROM;Left;10 reps    General Comments        Pertinent Vitals/Pain Pain Assessment: 0-10 Pain Score: 5  Pain Location: L hip/thigh Pain Descriptors / Indicators: Discomfort;Sore Pain Intervention(s): Monitored during session    Home Living                      Prior Function            PT Goals (current goals can now be found in the care plan section) Progress towards PT goals: Progressing toward goals    Frequency    7X/week      PT Plan Current plan remains appropriate    Co-evaluation              AM-PAC PT "6 Clicks" Mobility   Outcome Measure  Help needed turning from your back to your side while in a flat bed without using bedrails?: A Little Help needed moving from lying on your back to sitting on the side of a  flat bed without using bedrails?: A Little Help needed moving to and from a bed to a chair (including a wheelchair)?: A Little Help needed standing up from a chair using your arms (e.g., wheelchair or bedside chair)?: A Little Help needed to walk in hospital room?: A Little Help needed climbing 3-5 steps with a railing? : A Little 6 Click Score: 18    End of Session Equipment Utilized During Treatment: Gait belt Activity Tolerance: Patient tolerated treatment well Patient left: in chair;with call bell/phone within reach   PT Visit Diagnosis: Other abnormalities of gait and mobility (R26.89)     Time: 6659-9357 PT Time Calculation (min) (ACUTE ONLY): 19 min  Charges:  $Gait Training: 8-22  mins $Therapeutic Exercise: 8-22 mins                         Doreatha Massed, PT Acute Rehabilitation  Office: [phone] Pager: [phone]

## 2019-11-05 NOTE — Discharge Summary (Signed)
Discharge Summary  Patient ID: Dean Brown MRN: [id number] DOB/AGE: December 22, 1945 74 y.o.  Admit date: 11/04/2019 Discharge date: 11/05/2019  Admission Diagnoses:  Primary localized osteoarthritis of left hip  Discharge Diagnoses:  Principal Problem:   Primary localized osteoarthritis of left hip Active Problems:   BP (high blood pressure)   Obstructive apnea   Spinal stenosis of lumbar region with neurogenic claudication   DM2 (diabetes mellitus, type 2) ([facility])   Essential hypertension   Status post total hip replacement, left   Past Medical History:  Diagnosis Date  . Anxiety   . Arthritis   . Cancer [facility])    Prostate cancer  . Depression   . Diabetes mellitus    type 2  . Dyspnea   . GERD (gastroesophageal reflux disease)   . Hypertension   . Pneumonia 12/03/2013  . Primary localized osteoarthritis of left hip 10/14/2019  . Sleep apnea    cpap sleep study - [facility]    Surgeries: Procedure(s): TOTAL HIP ARTHROPLASTY ANTERIOR APPROACH on 11/04/2019   Consultants (if any):   Discharged Condition: Improved  Hospital Course: Dean Brown is an 74 y.o. male who was admitted 11/04/2019 with a diagnosis of Primary localized osteoarthritis of left hip and went to the operating room on 11/04/2019 and underwent the above named procedures.     He was given perioperative antibiotics:  Anti-infectives (From admission, onward)   Start     Dose/Rate Route Frequency Ordered Stop   11/04/19 1600  ceFAZolin (ANCEF) IVPB 2g/100 mL premix        2 g 200 mL/hr over 30 Minutes Intravenous Every 6 hours 11/04/19 1459 11/04/19 2151   11/04/19 1530  ceFAZolin (ANCEF) 2-4 GM/100ML-% IVPB       Note to Pharmacy: Salome Spotted   : cabinet override      11/04/19 1530 11/05/19 0344   11/04/19 0900  ceFAZolin (ANCEF) IVPB 2g/100 mL premix        2 g 200 mL/hr over 30 Minutes Intravenous On call to O.R. 11/04/19 5284 11/04/19 1029    .  He was given sequential compression devices, early  ambulation, and aspirin for DVT prophylaxis.  He benefited maximally from the hospital stay and there were no complications.    Recent vital signs:  Vitals:   11/05/19 0155 11/05/19 0443  BP: (!) 152/83 (!) 145/79  Pulse: 70 66  Resp: 16 20  Temp: 98.1 F (36.7 C) 98.1 F (36.7 C)  SpO2: 99% 100%    Recent laboratory studies:  Lab Results  Component Value Date   HGB 12.9 (L) 10/22/2019   HGB 12.2 (L) 09/24/2018   HGB 14.1 09/23/2018   Lab Results  Component Value Date   WBC 5.0 10/22/2019   PLT 255 10/22/2019   Lab Results  Component Value Date   INR 1.0 10/22/2019   Lab Results  Component Value Date   NA 142 10/22/2019   K 3.6 10/22/2019   CL 108 10/22/2019   CO2 27 10/22/2019   BUN 14 10/22/2019   CREATININE 0.92 10/22/2019   GLUCOSE 101 (H) 10/22/2019    Discharge Medications:   Allergies as of 11/05/2019      Reactions   Lisinopril Cough   Metformin Nausea Only, Other (See Comments)   "makes him feel bad"      Medication List    TAKE these medications   amLODipine 10 MG tablet Commonly known as: NORVASC Take 10 mg by mouth daily.   aspirin  81 MG chewable tablet Chew 1 tablet (81 mg total) by mouth 2 (two) times daily. What changed: when to take this   buPROPion 150 MG 24 hr tablet Commonly known as: WELLBUTRIN XL Take 150 mg by mouth daily.   candesartan 8 MG tablet Commonly known as: ATACAND Take 8 mg by mouth in the morning and at bedtime.   docusate sodium 100 MG capsule Commonly known as: COLACE Take 100 mg by mouth once a week.   DULoxetine 60 MG capsule Commonly known as: CYMBALTA Take 60 mg by mouth daily.   gabapentin 300 MG capsule Commonly known as: NEURONTIN Take by mouth in the morning and at bedtime.   GlucaGen 1 MG Solr injection Generic drug: glucagon Inject 1 mg into the vein once as needed (severely low blood sugar).   hydrochlorothiazide 25 MG tablet Commonly known as: HYDRODIURIL Take 25 mg by mouth daily.    Lantus SoloStar 100 UNIT/ML Solostar Pen Generic drug: insulin glargine Inject 26 Units into the skin at bedtime.   metoprolol succinate 50 MG 24 hr tablet Commonly known as: TOPROL-XL Take 50 mg by mouth daily.   NONFORMULARY OR COMPOUNDED ITEM Antifungal solution: Terbinafine 3%, Fluconazole 2%, Tea Tree Oil 5%, Urea 10%, Ibuprofen 2% in DMSO suspension #66mL   omeprazole 20 MG capsule Commonly known as: PRILOSEC Take 20 mg by mouth daily. Take 1 capsule (20 mg) by mouth scheduled every morning, may take an additional dose in the evening if needed for acid reflux/indigestion.   ondansetron 4 MG tablet Commonly known as: ZOFRAN Take 1 tablet (4 mg total) by mouth every 6 (six) hours as needed for nausea.   oxyCODONE 5 MG immediate release tablet Commonly known as: Oxy IR/ROXICODONE Take 1-2 tablets (5-10 mg total) by mouth every 4 (four) hours as needed for moderate pain (pain score 4-6).   Ozempic (1 MG/DOSE) 4 MG/3ML Sopn Generic drug: Semaglutide (1 MG/DOSE) Inject 1 mg into the skin every Wednesday.   rosuvastatin 40 MG tablet Commonly known as: CRESTOR Take 40 mg by mouth at bedtime.   sildenafil 100 MG tablet Commonly known as: VIAGRA Take 100 mg by mouth daily as needed for erectile dysfunction.            Durable Medical Equipment  (From admission, onward)         Start     Ordered   11/04/19 1618  DME Walker rolling  Once       Question Answer Comment  Walker: With 5 Inch Wheels   Patient needs a walker to treat with the following condition Status post total hip replacement, left      11/04/19 1617          Diagnostic Studies: DG C-Arm 1-60 Min-No Report  Result Date: 11/04/2019 Fluoroscopy was utilized by the requesting physician.  No radiographic interpretation.   DG HIP OPERATIVE UNILAT W OR W/O PELVIS LEFT  Result Date: 11/04/2019 CLINICAL DATA:  Left hip replacement EXAM: OPERATIVE LEFT HIP (WITH PELVIS IF PERFORMED) 4 VIEWS TECHNIQUE:  Fluoroscopic spot image(s) were submitted for interpretation post-operatively. COMPARISON:  None. FINDINGS: Changes of left hip replacement. Normal AP alignment. No hardware complicating feature. IMPRESSION: Left hip replacement.  No visible complicating feature. Electronically Signed   By: Rolm Baptise M.D.   On: 11/04/2019 11:57    Disposition: Discharge disposition: 01-Home or Self Care       Discharge Instructions    Call MD / Call 911   Complete by: As  directed    If you experience chest pain or shortness of breath, CALL 911 and be transported to the hospital emergency room.  If you develope a fever above 101 F, pus (white drainage) or increased drainage or redness at the wound, or calf pain, call your surgeon's office.   Constipation Prevention   Complete by: As directed    Drink plenty of fluids.  Prune juice and/or coffee may be helpful.  You may use a stool softener, such as Colace (over the counter) 100 mg twice a day.  Use MiraLax (over the counter) for constipation as needed but this may take several days to work.  Mag Citrate --OR-- Milk of Magnesia may also be used but follow directions on the label.   Diet - low sodium heart healthy   Complete by: As directed    Discharge instructions   Complete by: As directed    You may bear weight as tolerated. Keep your dressing on and dry until follow up. Take medicine to prevent blood clots as directed. Take pain medicine as needed with the goal of transitioning to over the counter medicines.  If needed, you may increase breakthrough pain medication (oxycodone) for the first few days post op - up to 2 tablets every 4 hours.  Stop this medication as soon as you are able.  INSTRUCTIONS AFTER JOINT REPLACEMENT   Remove items at home which could result in a fall. This includes throw rugs or furniture in walking pathways ICE to the affected joint every three hours while awake for 30 minutes at a time, for at least the first 3-5 days, and  then as needed for pain and swelling.  Continue to use ice for pain and swelling. You may notice swelling that will progress down to the foot and ankle.  This is normal after surgery.  Elevate your leg when you are not up walking on it.   Continue to use the breathing machine you got in the hospital (incentive spirometer) which will help keep your temperature down.  It is common for your temperature to cycle up and down following surgery, especially at night when you are not up moving around and exerting yourself.  The breathing machine keeps your lungs expanded and your temperature down.   DIET:  As you were doing prior to hospitalization, we recommend a well-balanced diet.  DRESSING / WOUND CARE / SHOWERING  You may shower 3 days after surgery, but keep the wounds dry during showering.  You may use an occlusive plastic wrap (Press'n Seal for example) with blue painter's tape at edges, NO SOAKING/SUBMERGING IN THE BATHTUB.  If the bandage gets wet, change with a clean dry gauze.  If the incision gets wet, pat the wound dry with a clean towel.  ACTIVITY  Increase activity slowly as tolerated, but follow the weight bearing instructions below.   No driving for 6 weeks or until further direction given by your physician.  You cannot drive while taking narcotics.  No lifting or carrying greater than 10 lbs. until further directed by your surgeon. Avoid periods of inactivity such as sitting longer than an hour when not asleep. This helps prevent blood clots.  You may return to work once you are authorized by your doctor.     WEIGHT BEARING   Weight bearing as tolerated with assist device (walker, cane, etc) as directed, use it as long as suggested by your surgeon or therapist, typically at least 4-6 weeks.   EXERCISES  Results  after joint replacement surgery are often greatly improved when you follow the exercise, range of motion and muscle strengthening exercises prescribed by your doctor.  Safety measures are also important to protect the joint from further injury. Any time any of these exercises cause you to have increased pain or swelling, decrease what you are doing until you are comfortable again and then slowly increase them. If you have problems or questions, call your caregiver or physical therapist for advice.   Rehabilitation is important following a joint replacement. After just a few days of immobilization, the muscles of the leg can become weakened and shrink (atrophy).  These exercises are designed to build up the tone and strength of the thigh and leg muscles and to improve motion. Often times heat used for twenty to thirty minutes before working out will loosen up your tissues and help with improving the range of motion but do not use heat for the first two weeks following surgery (sometimes heat can increase post-operative swelling).   These exercises can be done on a training (exercise) mat, on the floor, on a table or on a bed. Use whatever works the best and is most comfortable for you.    Use music or television while you are exercising so that the exercises are a pleasant break in your day. This will make your life better with the exercises acting as a break in your routine that you can look forward to.   Perform all exercises about fifteen times, three times per day or as directed.  You should exercise both the operative leg and the other leg as well.  Exercises include:   Quad Sets - Tighten up the muscle on the front of the thigh (Quad) and hold for 5-10 seconds.   Straight Leg Raises - With your knee straight (if you were given a brace, keep it on), lift the leg to 60 degrees, hold for 3 seconds, and slowly lower the leg.  Perform this exercise against resistance later as your leg gets stronger.  Leg Slides: Lying on your back, slowly slide your foot toward your buttocks, bending your knee up off the floor (only go as far as is comfortable). Then slowly slide your  foot back down until your leg is flat on the floor again.  Angel Wings: Lying on your back spread your legs to the side as far apart as you can without causing discomfort.  Hamstring Strength:  Lying on your back, push your heel against the floor with your leg straight by tightening up the muscles of your buttocks.  Repeat, but this time bend your knee to a comfortable angle, and push your heel against the floor.  You may put a pillow under the heel to make it more comfortable if necessary.   A rehabilitation program following joint replacement surgery can speed recovery and prevent re-injury in the future due to weakened muscles. Contact your doctor or a physical therapist for more information on knee rehabilitation.    CONSTIPATION  Constipation is defined medically as fewer than three stools per week and severe constipation as less than one stool per week.  Even if you have a regular bowel pattern at home, your normal regimen is likely to be disrupted due to multiple reasons following surgery.  Combination of anesthesia, postoperative narcotics, change in appetite and fluid intake all can affect your bowels.   YOU MUST use at least one of the following options; they are listed in order of increasing strength to  get the job done.  They are all available over the counter, and you may need to use some, POSSIBLY even all of these options:    Drink plenty of fluids (prune juice may be helpful) and high fiber foods Colace 100 mg by mouth twice a day  Senokot for constipation as directed and as needed Dulcolax (bisacodyl), take with full glass of water  Miralax (polyethylene glycol) once or twice a day as needed.  If you have tried all these things and are unable to have a bowel movement in the first 3-4 days after surgery call either your surgeon or your primary doctor.    If you experience loose stools or diarrhea, hold the medications until you stool forms back up.  If your symptoms do not get  better within 1 week or if they get worse, check with your doctor.  If you experience "the worst abdominal pain ever" or develop nausea or vomiting, please contact the office immediately for further recommendations for treatment.   ITCHING:  If you experience itching with your medications, try taking only a single pain pill, or even half a pain pill at a time.  You can also use Benadryl over the counter for itching or also to help with sleep.   TED HOSE STOCKINGS:  Use stockings on both legs until for at least 2 weeks or as directed by physician office. They may be removed at night for sleeping.  MEDICATIONS:  See your medication summary on the "After Visit Summary" that nursing will review with you.  You may have some home medications which will be placed on hold until you complete the course of blood thinner medication.  It is important for you to complete the blood thinner medication as prescribed.  PRECAUTIONS:  If you experience chest pain or shortness of breath - call 911 immediately for transfer to the hospital emergency department.   If you develop a fever greater that 101 F, purulent drainage from wound, increased redness or drainage from wound, foul odor from the wound/dressing, or calf pain - CONTACT YOUR SURGEON.                                                   FOLLOW-UP APPOINTMENTS:  If you do not already have a post-op appointment, please call the office for an appointment to be seen by your surgeon.  Guidelines for how soon to be seen are listed in your "After Visit Summary", but are typically between 1-4 weeks after surgery.  OTHER INSTRUCTIONS:  Dental Antibiotics:  In most cases prophylactic antibiotics for Dental procdeures after total joint surgery are not necessary.  Exceptions are as follows:  1. History of prior total joint infection  2. Severely immunocompromised (Organ Transplant, cancer chemotherapy, Rheumatoid biologic meds such as Tolar)  3. Poorly controlled  diabetes (A1C &gt; 8.0, blood glucose over 200)  If you have one of these conditions, contact your surgeon for an antibiotic prescription, prior to your dental procedure.   MAKE SURE YOU:  Understand these instructions.  Get help right away if you are not doing well or get worse.    Thank you for letting us be a part of your medical care team.  It is a privilege we respect greatly.  We hope these instructions will help you stay on track for a fast and full  recovery!   Increase activity slowly as tolerated   Complete by: As directed    TED hose   Complete by: As directed    Use stockings (TED hose) for 2 weeks on both leg(s).  You may remove them at night for sleeping.       Follow-up Information    Renette Butters, MD Follow up in 2 week(s).   Specialty: Orthopedic Surgery Why: keep appointment as scheduled for follow up and physical therapy Contact information: [address] [place] [address] [phone]                Signed: Nehemiah Massed PA-C 11/05/2019, 9:29 AM

## 2019-11-05 NOTE — Plan of Care (Signed)
All discharge instructions were given to Pt. All questions were answered.

## 2019-12-03 ENCOUNTER — Other Ambulatory Visit: Payer: Self-pay

## 2019-12-03 ENCOUNTER — Encounter: Payer: Self-pay | Admitting: Podiatry

## 2019-12-03 ENCOUNTER — Ambulatory Visit (INDEPENDENT_AMBULATORY_CARE_PROVIDER_SITE_OTHER): Payer: Medicare PPO | Admitting: Podiatry

## 2019-12-03 DIAGNOSIS — Q828 Other specified congenital malformations of skin: Secondary | ICD-10-CM

## 2019-12-03 DIAGNOSIS — M2142 Flat foot [pes planus] (acquired), left foot: Secondary | ICD-10-CM

## 2019-12-03 DIAGNOSIS — B351 Tinea unguium: Secondary | ICD-10-CM

## 2019-12-03 DIAGNOSIS — L84 Corns and callosities: Secondary | ICD-10-CM | POA: Diagnosis not present

## 2019-12-03 DIAGNOSIS — M2141 Flat foot [pes planus] (acquired), right foot: Secondary | ICD-10-CM

## 2019-12-03 DIAGNOSIS — Z794 Long term (current) use of insulin: Secondary | ICD-10-CM | POA: Diagnosis not present

## 2019-12-03 DIAGNOSIS — M79675 Pain in left toe(s): Secondary | ICD-10-CM

## 2019-12-03 DIAGNOSIS — E119 Type 2 diabetes mellitus without complications: Secondary | ICD-10-CM

## 2019-12-03 DIAGNOSIS — M79674 Pain in right toe(s): Secondary | ICD-10-CM

## 2019-12-04 NOTE — Progress Notes (Signed)
Subjective: Dean Brown is a pleasant 74 y.o. male patient seen today preventative diabetic foot care and painful corn(s) right 5th digit and callus(es) b/l hallux and painful mycotic nails b/l.  Pain interferes with ambulation. Aggravating factors include wearing enclosed shoe gear.   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  He relates tenderness of the left plantarlateral heel today.   He would like to know the status of his diabetic shoes.  Patient Active Problem List   Diagnosis Date Noted  . Status post total hip replacement, left 11/04/2019  . Primary localized osteoarthritis of left hip 10/14/2019  . DM2 (diabetes mellitus, type 2) ([place]) 09/24/2018  . Essential hypertension 09/24/2018  . Hypokalemia 09/24/2018  . Chest pain 09/23/2018  . Pseudophakia of right eye 09/17/2018  . Rotator cuff syndrome of right shoulder 06/21/2018  . History of prostate cancer 12/04/2017  . Insomnia 09/12/2017  . ED (erectile dysfunction) 01/23/2017  . 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 Gross hematuria 11/16/2014  . 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 SIRS (systemic inflammatory response syndrome) ([place]) 12/03/2013  . Acid reflux 12/03/2013  . BP (high blood pressure) 12/03/2013  . Arthritis, degenerative 12/03/2013  . History of penile implant 06/16/2013  . HLD (hyperlipidemia) 03/08/2012  . Obstructive apnea 08/04/2011  . 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 ACHILLES TENDINITIS 11/10/2008  . PES PLANUS 11/10/2008  . HALLUX VALGUS 11/10/2008    Current Outpatient Medications on File Prior to Visit  Medication Sig Dispense Refill  . amLODipine (NORVASC) 10 MG tablet Take 10 mg by mouth daily.    Marland Kitchen aspirin 81 MG chewable tablet Chew 1 tablet (81 mg total) by mouth 2 (two) times daily. 60 tablet 0  . buPROPion (WELLBUTRIN XL) 150 MG 24 hr tablet Take 150 mg by mouth daily.     . candesartan (ATACAND)  8 MG tablet Take 8 mg by mouth in the morning and at bedtime.     . celecoxib (CELEBREX) 200 MG capsule     . docusate sodium (COLACE) 100 MG capsule Take 100 mg by mouth once a week.    . DULoxetine (CYMBALTA) 60 MG capsule Take 60 mg by mouth daily.     Marland Kitchen gabapentin (NEURONTIN) 300 MG capsule Take by mouth in the morning and at bedtime.     Marland Kitchen glucagon (GLUCAGEN) 1 MG SOLR Inject 1 mg into the vein once as needed (severely low blood sugar).    . hydrochlorothiazide (HYDRODIURIL) 25 MG tablet Take 25 mg by mouth daily.     Marland Kitchen LANTUS SOLOSTAR 100 UNIT/ML Solostar Pen Inject 26 Units into the skin at bedtime.     . methylPREDNISolone acetate (DEPO-MEDROL) 40 MG/ML injection Inject 80 mg into the muscle once.    . metoprolol succinate (TOPROL-XL) 50 MG 24 hr tablet Take 50 mg by mouth daily.     . NONFORMULARY OR COMPOUNDED ITEM Antifungal solution: Terbinafine 3%, Fluconazole 2%, Tea Tree Oil 5%, Urea 10%, Ibuprofen 2% in DMSO suspension #21mL (Patient not taking: Reported on 10/14/2019) 1 each 3  . omeprazole (PRILOSEC) 20 MG capsule Take 20 mg by mouth daily. Take 1 capsule (20 mg) by mouth scheduled every morning, may take an additional dose in the evening if needed for acid reflux/indigestion.    . ondansetron (ZOFRAN) 4 MG tablet Take 1 tablet (4 mg total) by mouth every 6 (six) hours as needed for nausea. 20 tablet  0  . oxyCODONE (OXY IR/ROXICODONE) 5 MG immediate release tablet Take 1-2 tablets (5-10 mg total) by mouth every 4 (four) hours as needed for moderate pain (pain score 4-6). 30 tablet 0  . OZEMPIC, 1 MG/DOSE, 4 MG/3ML SOPN Inject 1 mg into the skin every Wednesday.    . predniSONE (DELTASONE) 20 MG tablet     . pregabalin (LYRICA) 75 MG capsule     . rosuvastatin (CRESTOR) 40 MG tablet Take 40 mg by mouth at bedtime.     . sildenafil (VIAGRA) 100 MG tablet Take 100 mg by mouth daily as needed for erectile dysfunction.      No current facility-administered medications on file prior to  visit.    Allergies  Allergen Reactions  . Lisinopril Cough  . Metformin Nausea Only and Other (See Comments)    "makes him feel bad"     Objective: Physical Exam  General: Dean Brown is a pleasant 74 y.o. African American male, in NAD. AAO x 3.   Vascular:  Neurovascular status unchanged b/l lower extremities. Capillary refill time to digits immediate b/l. Faintly palpable DP pulses b/l. Faintly palpable PT pulses b/l. Pedal hair present b/l. Skin temperature gradient within normal limits b/l. No edema noted b/l.  Dermatological:  Pedal skin with normal turgor, texture and tone bilaterally. No open wounds bilaterally. No interdigital macerations bilaterally. Toenails 1-5 b/l elongated, discolored, dystrophic, thickened, crumbly with subungual debris and tenderness to dorsal palpation. Hyperkeratotic lesion(s) L hallux, R hallux and R 5th toe.  No erythema, no edema, no drainage, no flocculence. Porokeratotic lesion(s) plantarlateral aspect of left heel. No erythema, no edema, no drainage, no flocculence.  Musculoskeletal:  Normal muscle strength 5/5 to all lower extremity muscle groups bilaterally. No pain crepitus or joint limitation noted with ROM b/l. Hammertoes noted to the R 5th toe. Pes planus deformity noted b/l.   Neurological:  Protective sensation intact 5/5 intact bilaterally with 10g monofilament b/l. Vibratory sensation intact b/l.  Assessment and Plan:  1. Pain due to onychomycosis of toenails of both feet   2. Corns and callosities   3. Porokeratosis   4. Pes planus of both feet   5. Type 2 diabetes mellitus without complication, with long-term current use of insulin ([place])    -Examined patient. -Continue diabetic foot care principles. -Update on diabetic shoes. Patient has new doctor treating his diabetes: Dr. Jani Brown. We will send paperwork to Dr. Maudie Brown and notify Mr. Tilghman when shoes arrive. -Toenails 1-5 b/l were debrided in length and girth with sterile  nail nippers and dremel without iatrogenic bleeding.  -Corn(s) R 5th toe and callus(es) L hallux and R hallux were pared utilizing sterile scalpel blade without incident. Total number debrided =3. -Painful porokeratotic lesion(s) plantar aspect of left heel pared and enucleated with sterile scalpel blade without incident. -Patient to report any pedal injuries to medical professional immediately. -Patient to continue soft, supportive shoe gear daily. -Patient/POA to call should there be question/concern in the interim.  Return in about 9 weeks (around 02/04/2020) for diabetic nail and callus trim.  Marzetta Board, DPM

## 2020-02-16 ENCOUNTER — Other Ambulatory Visit: Payer: Self-pay

## 2020-02-16 ENCOUNTER — Ambulatory Visit (INDEPENDENT_AMBULATORY_CARE_PROVIDER_SITE_OTHER): Payer: Medicare PPO | Admitting: Podiatry

## 2020-02-16 DIAGNOSIS — M79674 Pain in right toe(s): Secondary | ICD-10-CM

## 2020-02-16 DIAGNOSIS — L84 Corns and callosities: Secondary | ICD-10-CM | POA: Diagnosis not present

## 2020-02-16 DIAGNOSIS — E119 Type 2 diabetes mellitus without complications: Secondary | ICD-10-CM

## 2020-02-16 DIAGNOSIS — Z794 Long term (current) use of insulin: Secondary | ICD-10-CM

## 2020-02-16 DIAGNOSIS — M79675 Pain in left toe(s): Secondary | ICD-10-CM | POA: Diagnosis not present

## 2020-02-16 DIAGNOSIS — B351 Tinea unguium: Secondary | ICD-10-CM | POA: Diagnosis not present

## 2020-02-16 DIAGNOSIS — M2142 Flat foot [pes planus] (acquired), left foot: Secondary | ICD-10-CM

## 2020-02-16 DIAGNOSIS — M2141 Flat foot [pes planus] (acquired), right foot: Secondary | ICD-10-CM

## 2020-02-17 ENCOUNTER — Ambulatory Visit: Payer: No Typology Code available for payment source | Attending: Internal Medicine

## 2020-02-17 DIAGNOSIS — Z23 Encounter for immunization: Secondary | ICD-10-CM

## 2020-02-17 NOTE — Progress Notes (Signed)
   Covid-19 Vaccination Clinic  Name:  Dean Brown    MRN: [id number] DOB: 03/20/1946  02/17/2020  Mr. Dean Brown was observed post Covid-19 immunization for 15 minutes without incident. He was provided with Vaccine Information Sheet and instruction to access the V-Safe system.   Mr. Dean Brown was instructed to call 911 with any severe reactions post vaccine: Marland Kitchen Difficulty breathing  . Swelling of face and throat  . A fast heartbeat  . A bad rash all over body  . Dizziness and weakness

## 2020-02-21 ENCOUNTER — Encounter: Payer: Self-pay | Admitting: Podiatry

## 2020-02-21 NOTE — Progress Notes (Signed)
Subjective: Dean Brown is a pleasant 74 y.o. male patient seen today preventative diabetic foot care and painful corn(s) right 5th digit and callus(es) b/l hallux and painful mycotic nails b/l.  Pain interferes with ambulation. Aggravating factors include wearing enclosed shoe gear.   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  He relates he is recovering from left hip replacement performed on 11/04/19. Patient states hip still bothers him with stiffness. He plans to have more PT. He also has right shoulder and back pain.   Patient Active Problem List   Diagnosis Date Noted  . Status post total hip replacement, left 11/04/2019  . Primary localized osteoarthritis of left hip 10/14/2019  . DM2 (diabetes mellitus, type 2) ([place]) 09/24/2018  . Essential hypertension 09/24/2018  . Hypokalemia 09/24/2018  . Chest pain 09/23/2018  . Pseudophakia of right eye 09/17/2018  . Rotator cuff syndrome of right shoulder 06/21/2018  . History of prostate cancer 12/04/2017  . Insomnia 09/12/2017  . ED (erectile dysfunction) 01/23/2017  . 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 Gross hematuria 11/16/2014  . 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 SIRS (systemic inflammatory response syndrome) ([place]) 12/03/2013  . Acid reflux 12/03/2013  . BP (high blood pressure) 12/03/2013  . Arthritis, degenerative 12/03/2013  . History of penile implant 06/16/2013  . HLD (hyperlipidemia) 03/08/2012  . Obstructive apnea 08/04/2011  . 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 ACHILLES TENDINITIS 11/10/2008  . PES PLANUS 11/10/2008  . HALLUX VALGUS 11/10/2008    Current Outpatient Medications on File Prior to Visit  Medication Sig Dispense Refill  . amLODipine (NORVASC) 10 MG tablet Take 10 mg by mouth daily.    Marland Kitchen buPROPion (WELLBUTRIN XL) 150 MG 24 hr tablet Take 150 mg by mouth daily.     . candesartan (ATACAND) 8 MG tablet Take 8 mg by  mouth in the morning and at bedtime.     . celecoxib (CELEBREX) 200 MG capsule     . docusate sodium (COLACE) 100 MG capsule Take 100 mg by mouth once a week.    . DULoxetine (CYMBALTA) 60 MG capsule Take 60 mg by mouth daily.     Marland Kitchen gabapentin (NEURONTIN) 300 MG capsule Take by mouth in the morning and at bedtime.     Marland Kitchen glucagon (GLUCAGEN) 1 MG SOLR Inject 1 mg into the vein once as needed (severely low blood sugar).    . hydrochlorothiazide (HYDRODIURIL) 25 MG tablet Take 25 mg by mouth daily.     Marland Kitchen LANTUS SOLOSTAR 100 UNIT/ML Solostar Pen Inject 26 Units into the skin at bedtime.     . methylPREDNISolone acetate (DEPO-MEDROL) 40 MG/ML injection Inject 80 mg into the muscle once.    . metoprolol succinate (TOPROL-XL) 50 MG 24 hr tablet Take 50 mg by mouth daily.     . NONFORMULARY OR COMPOUNDED ITEM Antifungal solution: Terbinafine 3%, Fluconazole 2%, Tea Tree Oil 5%, Urea 10%, Ibuprofen 2% in DMSO suspension #24mL (Patient not taking: Reported on 10/14/2019) 1 each 3  . omeprazole (PRILOSEC) 20 MG capsule Take 20 mg by mouth daily. Take 1 capsule (20 mg) by mouth scheduled every morning, may take an additional dose in the evening if needed for acid reflux/indigestion.    . ondansetron (ZOFRAN) 4 MG tablet Take 1 tablet (4 mg total) by mouth every 6 (six) hours as needed for nausea. 20 tablet 0  . oxyCODONE (OXY IR/ROXICODONE) 5 MG immediate  release tablet Take 1-2 tablets (5-10 mg total) by mouth every 4 (four) hours as needed for moderate pain (pain score 4-6). 30 tablet 0  . OZEMPIC, 1 MG/DOSE, 4 MG/3ML SOPN Inject 1 mg into the skin every Wednesday.    . predniSONE (DELTASONE) 20 MG tablet     . pregabalin (LYRICA) 75 MG capsule     . rosuvastatin (CRESTOR) 40 MG tablet Take 40 mg by mouth at bedtime.     . sildenafil (VIAGRA) 100 MG tablet Take 100 mg by mouth daily as needed for erectile dysfunction.      No current facility-administered medications on file prior to visit.    Allergies   Allergen Reactions  . Lisinopril Cough  . Metformin Nausea Only and Other (See Comments)    "makes him feel bad"     Objective: Physical Exam  General: TULIO FACUNDO is a pleasant 74 y.o. African American male, in NAD. AAO x 3.   Vascular:  Neurovascular status unchanged b/l lower extremities. Capillary refill time to digits immediate b/l. Faintly palpable DP pulses b/l. Faintly palpable PT pulses b/l. Pedal hair present b/l. Skin temperature gradient within normal limits b/l. No edema noted b/l.  Dermatological:  Pedal skin with normal turgor, texture and tone bilaterally. No open wounds bilaterally. No interdigital macerations bilaterally. Toenails 1-5 b/l elongated, discolored, dystrophic, thickened, crumbly with subungual debris and tenderness to dorsal palpation. Hyperkeratotic lesion(s) L hallux, R hallux and R 5th toe. No erythema, no edema, no drainage, no flocculence. Porokeratotic lesion(s) plantarlateral aspect of left heel. No erythema, no edema, no drainage, no flocculence.  Musculoskeletal:  Normal muscle strength 5/5 to all lower extremity muscle groups bilaterally. No pain crepitus or joint limitation noted with ROM b/l. Hammertoes noted to the R 5th toe. Pes planus deformity noted b/l.   Neurological:  Protective sensation intact 5/5 intact bilaterally with 10g monofilament b/l. Vibratory sensation intact b/l.  Assessment and Plan:  1. Pain due to onychomycosis of toenails of both feet   2. Corns and callosities   3. Pes planus of both feet   4. Type 2 diabetes mellitus without complication, with long-term current use of insulin ([place])    -Examined patient. -Continue diabetic foot care principles. -No new findings. No new orders. -Toenails 1-5 b/l were debrided in length and girth with sterile nail nippers and dremel without iatrogenic bleeding.  -Corn(s) R 5th toe and callus(es) L hallux and R hallux were pared utilizing sterile scalpel blade without incident.  Total number debrided =3. -Painful porokeratotic lesion(s) plantar aspect of left heel pared and enucleated with sterile scalpel blade without incident. -Patient to report any pedal injuries to medical professional immediately. -Patient to continue soft, supportive shoe gear daily. -Patient/POA to call should there be question/concern in the interim.  Return in about 3 months (around 05/18/2020) for diabetic toenails, corn(s)/callus(es).  Marzetta Board, DPM

## 2020-04-02 ENCOUNTER — Telehealth: Payer: Self-pay | Admitting: Podiatry

## 2020-04-02 NOTE — Telephone Encounter (Signed)
Pt called and left message for status of diabetic shoes that were ordered 7 months ago.  I returned call and spoke to pt we did receive the paperwork but it was marked thru and Dr Sammuel Bailiff signed it and soes not comply with  medicare.  I explained that and pt said Dr Maudie Mercury has cancer and cannot sign it. I explained that I have changed the provider and will fax to them when I get off the phone with pt.

## 2020-04-12 ENCOUNTER — Ambulatory Visit: Payer: Medicare PPO | Admitting: Podiatry

## 2020-04-21 ENCOUNTER — Ambulatory Visit (INDEPENDENT_AMBULATORY_CARE_PROVIDER_SITE_OTHER): Payer: Medicare PPO

## 2020-04-21 ENCOUNTER — Other Ambulatory Visit: Payer: Self-pay

## 2020-04-21 ENCOUNTER — Ambulatory Visit (INDEPENDENT_AMBULATORY_CARE_PROVIDER_SITE_OTHER): Payer: Medicare PPO | Admitting: Podiatry

## 2020-04-21 DIAGNOSIS — M2141 Flat foot [pes planus] (acquired), right foot: Secondary | ICD-10-CM

## 2020-04-21 DIAGNOSIS — M722 Plantar fascial fibromatosis: Secondary | ICD-10-CM | POA: Diagnosis not present

## 2020-04-21 DIAGNOSIS — M2142 Flat foot [pes planus] (acquired), left foot: Secondary | ICD-10-CM | POA: Diagnosis not present

## 2020-04-21 DIAGNOSIS — M79671 Pain in right foot: Secondary | ICD-10-CM

## 2020-04-21 MED ORDER — MELOXICAM 15 MG PO TABS
15.0000 mg | ORAL_TABLET | Freq: Every day | ORAL | 1 refills | Status: DC
Start: 2020-04-21 — End: 2021-07-15

## 2020-04-26 MED ORDER — BETAMETHASONE SOD PHOS & ACET 6 (3-3) MG/ML IJ SUSP
3.0000 mg | Freq: Once | INTRAMUSCULAR | Status: AC
  Administered 2020-04-26: 3 mg via INTRAMUSCULAR

## 2020-04-26 NOTE — Progress Notes (Signed)
   Subjective: 75 y.o. male presenting to the office today for evaluation of bilateral heel pain has been going on for approximately 4 weeks now.  Patient describes it as a sharp pain.  Gradual onset.  He denies a history of injury.  He has not done anything for treatment.  He presents for further treatment evaluation   Past Medical History:  Diagnosis Date  . Anxiety   . Arthritis   . Cancer [facility])    Prostate cancer  . Depression   . Diabetes mellitus    type 2  . Dyspnea   . GERD (gastroesophageal reflux disease)   . Hypertension   . Pneumonia 12/03/2013  . Primary localized osteoarthritis of left hip 10/14/2019  . Sleep apnea    cpap sleep study - [facility]     Objective: Physical Exam General: The patient is alert and oriented x3 in no acute distress.  Dermatology: Skin is warm, dry and supple bilateral lower extremities. Negative for open lesions or macerations bilateral.   Vascular: Dorsalis Pedis and Posterior Tibial pulses palpable bilateral.  Capillary fill time is immediate to all digits.  Neurological: Epicritic and protective threshold intact bilateral.   Musculoskeletal: Tenderness to palpation to the plantar aspect of the bilateral heels along the plantar fascia. All other joints range of motion within normal limits bilateral. Strength 5/5 in all groups bilateral.   Radiographic exam: Normal osseous mineralization. Joint spaces preserved. No fracture/dislocation/boney destruction. No other soft tissue abnormalities or radiopaque foreign bodies.   Assessment: 1. plantar fasciitis bilateral feet  Plan of Care:  1. Patient evaluated. Xrays reviewed.   2. Injection of 0.5cc Celestone soluspan injected into the bilateral heels.  3. Rx for Meloxicam ordered for patient. 4.  Recommend good supportive shoes 5.  Instructed patient regarding therapies and modalities at home to alleviate symptoms.  6. Return to clinic in 4 weeks.    Felecia Shelling, DPM [facility]  Dr. Felecia Shelling, DPM    [address] [place], [place] [address]                Office [phone]  Fax [phone]

## 2020-05-06 IMAGING — CR CHEST - 2 VIEW
2 series · 2 of 2 positions shown · non-contrast
Comparison: November 13, 2017

CLINICAL DATA: Left chest pain.  Shortness of breath.

EXAM:
CHEST - 2 VIEW

[w chest pa]
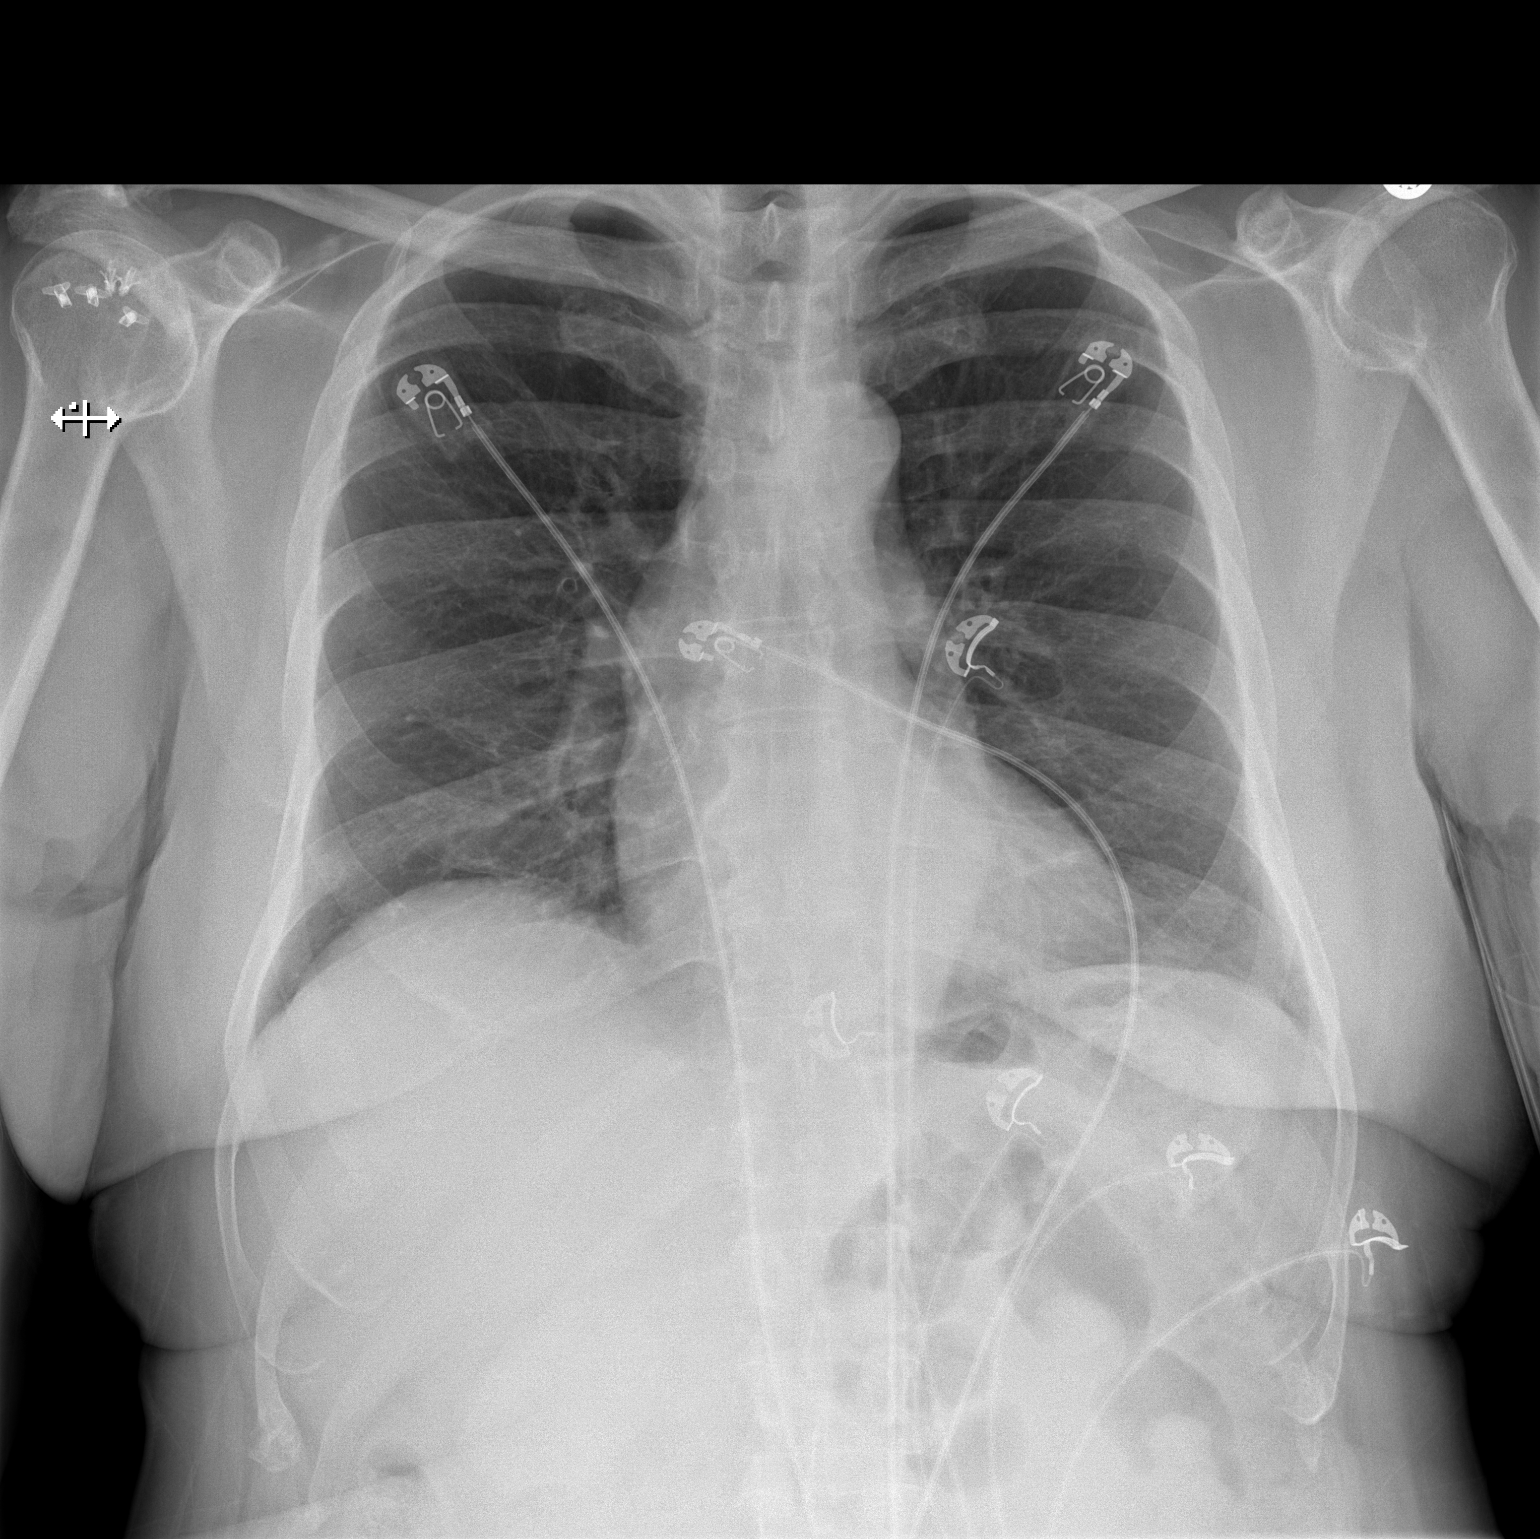

[w chest lat]
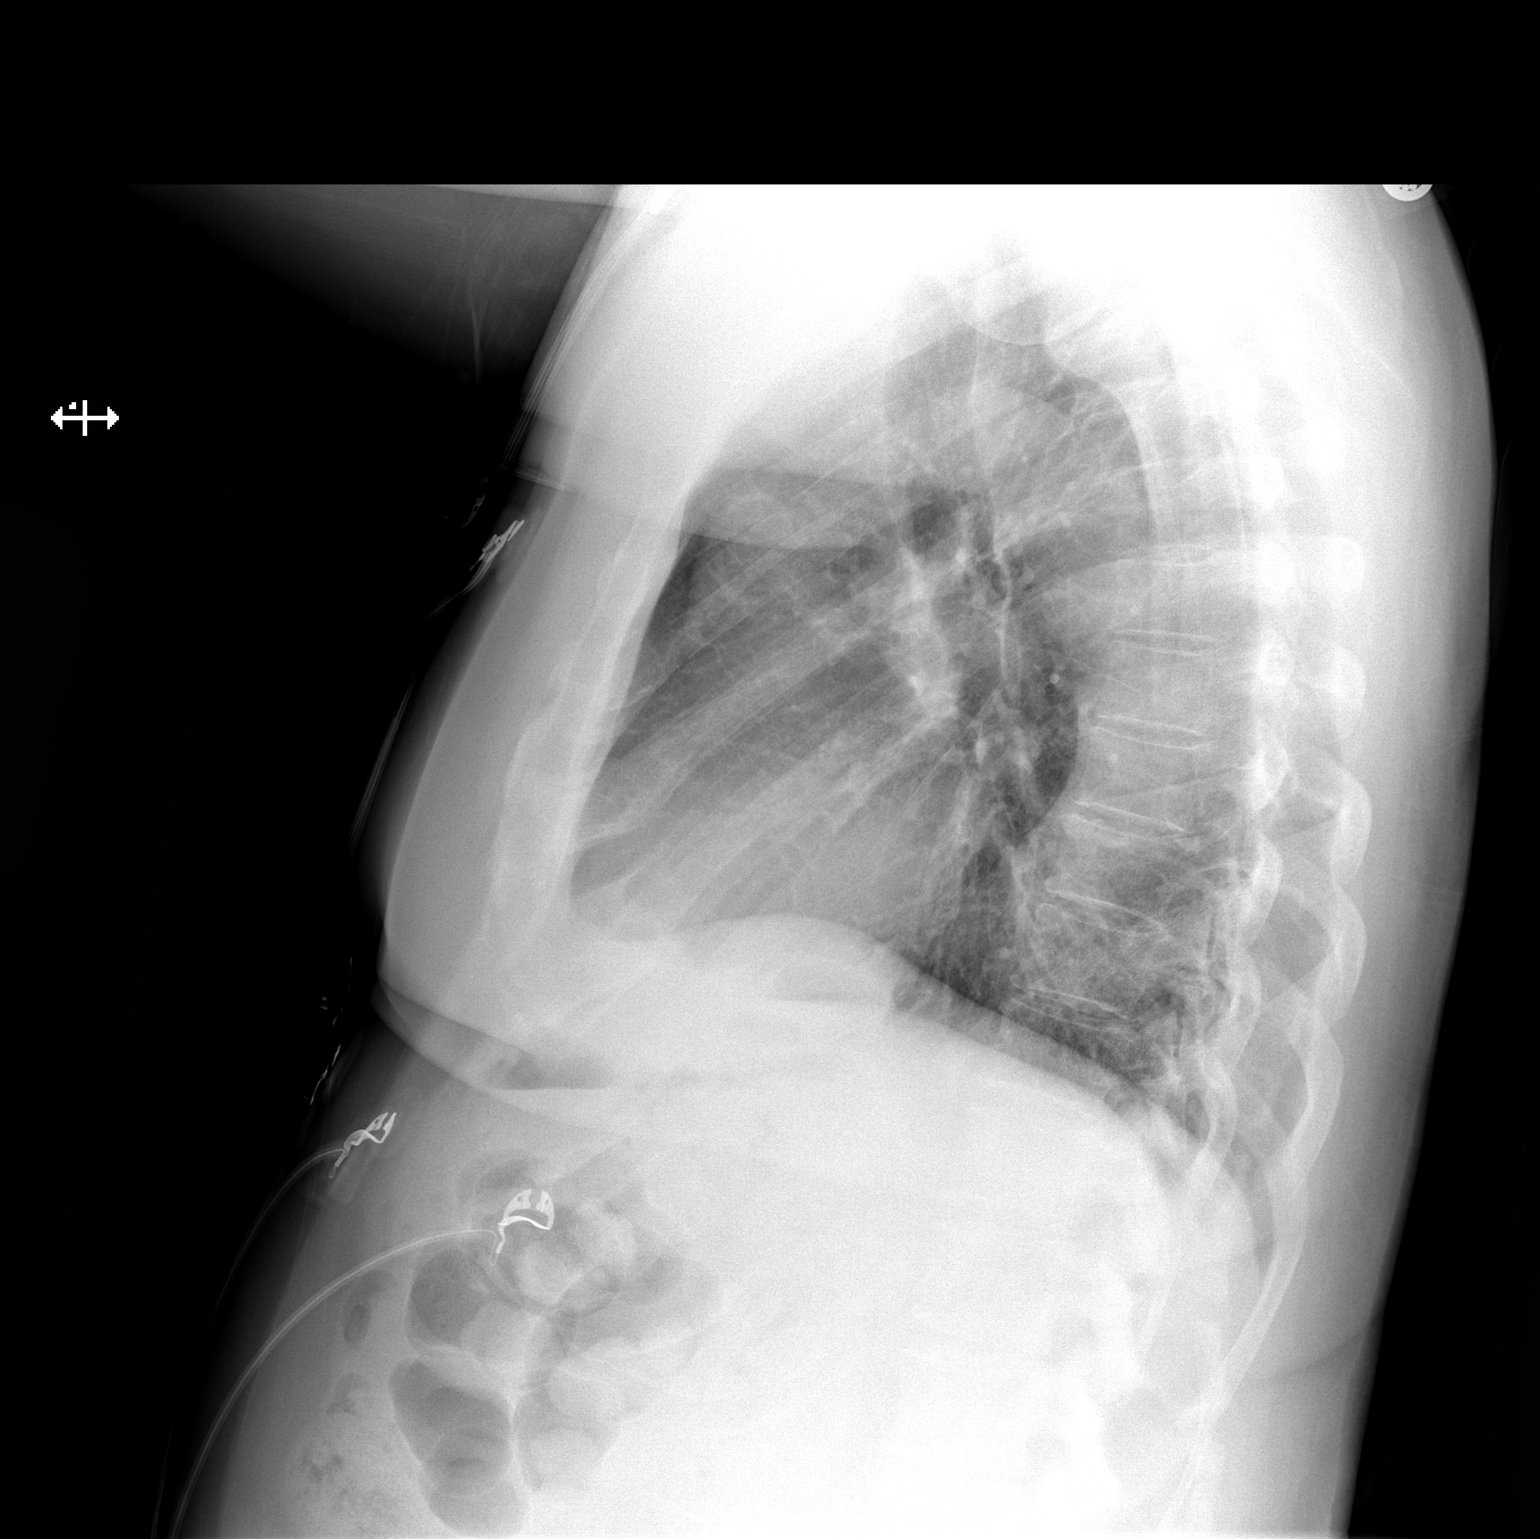

[2 of 2 positions shown; findings below may reference images not displayed]

FINDINGS: The heart size and mediastinal contours are within normal limits.
Both lungs are clear. The visualized skeletal structures are
unremarkable.
IMPRESSION: No active cardiopulmonary disease.

## 2020-05-24 ENCOUNTER — Other Ambulatory Visit: Payer: Self-pay

## 2020-05-24 ENCOUNTER — Ambulatory Visit (INDEPENDENT_AMBULATORY_CARE_PROVIDER_SITE_OTHER): Payer: Medicare PPO | Admitting: Orthotics

## 2020-05-24 ENCOUNTER — Ambulatory Visit: Payer: Medicare PPO | Admitting: Podiatry

## 2020-05-24 ENCOUNTER — Ambulatory Visit (INDEPENDENT_AMBULATORY_CARE_PROVIDER_SITE_OTHER): Payer: Medicare PPO | Admitting: Podiatry

## 2020-05-24 DIAGNOSIS — M2141 Flat foot [pes planus] (acquired), right foot: Secondary | ICD-10-CM

## 2020-05-24 DIAGNOSIS — Z794 Long term (current) use of insulin: Secondary | ICD-10-CM | POA: Diagnosis not present

## 2020-05-24 DIAGNOSIS — M722 Plantar fascial fibromatosis: Secondary | ICD-10-CM

## 2020-05-24 DIAGNOSIS — L84 Corns and callosities: Secondary | ICD-10-CM

## 2020-05-24 DIAGNOSIS — M2142 Flat foot [pes planus] (acquired), left foot: Secondary | ICD-10-CM | POA: Diagnosis not present

## 2020-05-24 DIAGNOSIS — E119 Type 2 diabetes mellitus without complications: Secondary | ICD-10-CM

## 2020-05-24 MED ORDER — BETAMETHASONE SOD PHOS & ACET 6 (3-3) MG/ML IJ SUSP
3.0000 mg | Freq: Once | INTRAMUSCULAR | Status: AC
  Administered 2020-05-24: 3 mg via INTRA_ARTICULAR

## 2020-05-24 NOTE — Progress Notes (Signed)

## 2020-05-24 NOTE — Progress Notes (Signed)
   Subjective: 75 y.o. male presenting to the office today for follow-up evaluation of bilateral plantar fascitis.  He states that the injections helped significantly.  He is not sure if the meloxicam is helping.  He presents for further treatment and evaluation.  No new complaints at this time   Past Medical History:  Diagnosis Date  . Anxiety   . Arthritis   . Cancer [facility])    Prostate cancer  . Depression   . Diabetes mellitus    type 2  . Dyspnea   . GERD (gastroesophageal reflux disease)   . Hypertension   . Pneumonia 12/03/2013  . Primary localized osteoarthritis of left hip 10/14/2019  . Sleep apnea    cpap sleep study - [facility]     Objective: Physical Exam General: The patient is alert and oriented x3 in no acute distress.  Dermatology: Skin is warm, dry and supple bilateral lower extremities. Negative for open lesions or macerations bilateral.   Vascular: Dorsalis Pedis and Posterior Tibial pulses palpable bilateral.  Capillary fill time is immediate to all digits.  Neurological: Epicritic and protective threshold intact bilateral.   Musculoskeletal: Tenderness to palpation to the plantar aspect of the bilateral heels along the plantar fascia. All other joints range of motion within normal limits bilateral. Strength 5/5 in all groups bilateral.   Assessment: 1. plantar fasciitis bilateral feet  Plan of Care:  1. Patient evaluated. Xrays reviewed.   2. Injection of 0.5cc Celestone soluspan injected into the bilateral heels.  3.  Continue meloxicam 15 mg daily as needed 4.  Patient is meeting with the Pedorthist today for new diabetic shoes and insoles.  Wear daily.   5.  Return to clinic for next scheduled routine foot care appointment  Edrick Kins, DPM [facility]  Dr. Edrick Kins, DPM    [address], [place] [address]                Office [phone]  Fax [phone]

## 2020-06-30 ENCOUNTER — Ambulatory Visit: Payer: Medicare PPO | Admitting: Podiatry

## 2020-07-07 ENCOUNTER — Ambulatory Visit (INDEPENDENT_AMBULATORY_CARE_PROVIDER_SITE_OTHER): Payer: Medicare PPO | Admitting: Podiatry

## 2020-07-07 ENCOUNTER — Other Ambulatory Visit: Payer: Self-pay

## 2020-07-07 DIAGNOSIS — M79674 Pain in right toe(s): Secondary | ICD-10-CM | POA: Diagnosis not present

## 2020-07-07 DIAGNOSIS — E0843 Diabetes mellitus due to underlying condition with diabetic autonomic (poly)neuropathy: Secondary | ICD-10-CM

## 2020-07-07 DIAGNOSIS — M79675 Pain in left toe(s): Secondary | ICD-10-CM

## 2020-07-07 DIAGNOSIS — B351 Tinea unguium: Secondary | ICD-10-CM | POA: Diagnosis not present

## 2020-07-07 DIAGNOSIS — L989 Disorder of the skin and subcutaneous tissue, unspecified: Secondary | ICD-10-CM | POA: Diagnosis not present

## 2020-07-07 NOTE — Progress Notes (Signed)
   SUBJECTIVE Patient with a history of diabetes mellitus presents to office today complaining of elongated, thickened nails that cause pain while ambulating in shoes.  He is unable to trim his own nails. Patient is here for further evaluation and treatment.   Past Medical History:  Diagnosis Date  . Anxiety   . Arthritis   . Cancer [facility])    Prostate cancer  . Depression   . Diabetes mellitus    type 2  . Dyspnea   . GERD (gastroesophageal reflux disease)   . Hypertension   . Pneumonia 12/03/2013  . Primary localized osteoarthritis of left hip 10/14/2019  . Sleep apnea    cpap sleep study - [place] Patient is awake, alert, and oriented x 3 and in no acute distress. Derm Skin is dry and supple bilateral. Negative open lesions or macerations. Remaining integument unremarkable. Nails are tender, long, thickened and dystrophic with subungual debris, consistent with onychomycosis, 1-5 bilateral. No signs of infection noted.  Hyperkeratotic preulcerative callus lesions noted bilateral feet Vasc  DP and PT pedal pulses palpable bilaterally. Temperature gradient within normal limits.  Neuro Epicritic and protective threshold sensation diminished bilaterally.  Musculoskeletal Exam No symptomatic pedal deformities noted bilateral. Muscular strength within normal limits.  ASSESSMENT 1. Diabetes Mellitus w/ peripheral neuropathy 2. Onychomycosis of nail due to dermatophyte bilateral 3. Pain in foot bilateral 4.  Preulcerative callus lesions bilateral feet  PLAN OF CARE 1. Patient evaluated today. 2. Instructed to maintain good pedal hygiene and foot care. Stressed importance of controlling blood sugar.  3. Mechanical debridement of nails 1-5 bilaterally performed using a nail nipper. Filed with dremel without incident.  4.  Excisional debridement of the hyperkeratotic preulcerative callus tissue was performed using a tissue nipper without incident or bleeding  5.  Return  to clinic in 3 mos.     Edrick Kins, DPM [facility]  Dr. Edrick Kins, DPM    [address], [place] [address]                Office [phone]  Fax [phone]

## 2020-08-23 ENCOUNTER — Other Ambulatory Visit: Payer: Self-pay

## 2020-08-23 ENCOUNTER — Emergency Department (HOSPITAL_COMMUNITY)
Admission: EM | Admit: 2020-08-23 | Discharge: 2020-08-24 | Disposition: A | Discharge status: Home / Self Care | Payer: Medicare PPO | Attending: Emergency Medicine | Admitting: Emergency Medicine

## 2020-08-23 ENCOUNTER — Encounter (HOSPITAL_COMMUNITY): Payer: Self-pay

## 2020-08-23 ENCOUNTER — Emergency Department (HOSPITAL_COMMUNITY): Payer: Medicare PPO

## 2020-08-23 DIAGNOSIS — K625 Hemorrhage of anus and rectum: Secondary | ICD-10-CM | POA: Insufficient documentation

## 2020-08-23 DIAGNOSIS — Z87891 Personal history of nicotine dependence: Secondary | ICD-10-CM | POA: Insufficient documentation

## 2020-08-23 DIAGNOSIS — I10 Essential (primary) hypertension: Secondary | ICD-10-CM | POA: Insufficient documentation

## 2020-08-23 DIAGNOSIS — E119 Type 2 diabetes mellitus without complications: Secondary | ICD-10-CM | POA: Diagnosis not present

## 2020-08-23 DIAGNOSIS — K219 Gastro-esophageal reflux disease without esophagitis: Secondary | ICD-10-CM | POA: Diagnosis not present

## 2020-08-23 DIAGNOSIS — R109 Unspecified abdominal pain: Secondary | ICD-10-CM | POA: Diagnosis not present

## 2020-08-23 DIAGNOSIS — Z8546 Personal history of malignant neoplasm of prostate: Secondary | ICD-10-CM | POA: Diagnosis not present

## 2020-08-23 DIAGNOSIS — Z96642 Presence of left artificial hip joint: Secondary | ICD-10-CM | POA: Diagnosis not present

## 2020-08-23 DIAGNOSIS — Z79899 Other long term (current) drug therapy: Secondary | ICD-10-CM | POA: Insufficient documentation

## 2020-08-23 LAB — COMPREHENSIVE METABOLIC PANEL
ALT: 21 U/L (ref 0–44)
AST: 25 U/L (ref 15–41)
Albumin: 3.7 g/dL (ref 3.5–5.0)
Alkaline Phosphatase: 72 U/L (ref 38–126)
Anion gap: 6 (ref 5–15)
BUN: 12 mg/dL (ref 8–23)
CO2: 27 mmol/L (ref 22–32)
Calcium: 9.1 mg/dL (ref 8.9–10.3)
Chloride: 109 mmol/L (ref 98–111)
Creatinine, Ser: 1.37 mg/dL — ABNORMAL HIGH (ref 0.61–1.24)
GFR, Estimated: 55 mL/min — ABNORMAL LOW (ref >60)
Glucose, Bld: 121 mg/dL — ABNORMAL HIGH (ref 70–99)
Potassium: 4.3 mmol/L (ref 3.5–5.1)
Sodium: 142 mmol/L (ref 135–145)
Total Bilirubin: 0.6 mg/dL (ref 0.3–1.2)
Total Protein: 6.6 g/dL (ref 6.5–8.1)

## 2020-08-23 LAB — CBC WITH DIFFERENTIAL/PLATELET
Abs Immature Granulocytes: 0.02 10*3/uL (ref 0.00–0.07)
Basophils Absolute: 0 10*3/uL (ref 0.0–0.1)
Basophils Relative: 1 %
Eosinophils Absolute: 0.1 10*3/uL (ref 0.0–0.5)
Eosinophils Relative: 2 %
HCT: 41.3 % (ref 39.0–52.0)
Hemoglobin: 13.3 g/dL (ref 13.0–17.0)
Immature Granulocytes: 0 %
Lymphocytes Relative: 27 %
Lymphs Abs: 1.6 10*3/uL (ref 0.7–4.0)
MCH: 31.1 pg (ref 26.0–34.0)
MCHC: 32.2 g/dL (ref 30.0–36.0)
MCV: 96.7 fL (ref 80.0–100.0)
Monocytes Absolute: 0.5 10*3/uL (ref 0.1–1.0)
Monocytes Relative: 9 %
Neutro Abs: 3.7 10*3/uL (ref 1.7–7.7)
Neutrophils Relative %: 61 %
Platelets: 276 10*3/uL (ref 150–400)
RBC: 4.27 MIL/uL (ref 4.22–5.81)
RDW: 12.4 % (ref 11.5–15.5)
WBC: 5.9 10*3/uL (ref 4.0–10.5)
nRBC: 0 % (ref 0.0–0.2)

## 2020-08-23 LAB — LIPASE, BLOOD: Lipase: 42 U/L (ref 11–51)

## 2020-08-23 MED ORDER — IOHEXOL 300 MG/ML  SOLN
100.0000 mL | Freq: Once | INTRAMUSCULAR | Status: AC | PRN
  Administered 2020-08-23: 100 mL via INTRAVENOUS

## 2020-08-23 NOTE — ED Notes (Signed)
Called patient for vitals. Patient in CT at this time

## 2020-08-23 NOTE — ED Triage Notes (Signed)
Pt c/o red blood in stool today. Pt c/o lower abdominal pain. Pt denies nausea, vomiting, diarrhea. Has had constipation x 2 weeks.

## 2020-08-23 NOTE — ED Provider Notes (Signed)
Emergency Medicine Provider Triage Evaluation Note  Dean Brown 75 y.o. male was evaluated in triage.  Pt complains of blood in stool that began today.  Patient reports that he had had constipation for about 2 weeks or so.  He reports that today was the first time he had had a bowel movement.  He had to strain.  He noticed when he wiped, he had bright red blood.  He states he has had some pain in the left lower quadrant.  He cannot recall if he has had a history of diverticulitis.  He denies any nausea/vomiting/fever.  He is on aspirin.  No other blood thinner.  Review of Systems  Positive: Blood in stool, abdominal pain Negative: Fever, nausea/vomiting.  Physical Exam  BP 134/82   Pulse 70   Temp 98.2 F (36.8 C) (Oral)   Resp 18   Ht 5\' 4"  (1.626 m)   Wt 65.8 kg   SpO2 100%   BMI 24.89 kg/m  Gen:   Awake, no distress   HEENT:  Atraumatic  Resp:  Normal effort  Cardiac:  Normal rate  Abd:   Nondistended, mild tenderness in the left lower quadrant. MSK:   Moves extremities without difficulty Neuro:  Speech clear  Medical Decision Making  Medically screening exam initiated at 3:55 AM.  Appropriate orders placed.  Dean Brown was informed that the remainder of the evaluation will be completed by another provider, this initial triage assessment does not replace that evaluation, and the importance of remaining in the ED until their evaluation is complete.  Clinical Impression  abd pain, blood in stool   Portions of this note were generated with Dragon dictation software. Dictation errors may occur despite best attempts at proofreading.     Volanda Napoleon, PA-C 08/23/20 2013    Lorelle Gibbs, DO 08/24/20 0007

## 2020-08-24 DIAGNOSIS — K625 Hemorrhage of anus and rectum: Secondary | ICD-10-CM | POA: Diagnosis not present

## 2020-08-24 LAB — URINALYSIS, ROUTINE W REFLEX MICROSCOPIC
Bilirubin Urine: NEGATIVE
Glucose, UA: NEGATIVE mg/dL
Hgb urine dipstick: NEGATIVE
Ketones, ur: NEGATIVE mg/dL
Leukocytes,Ua: NEGATIVE
Nitrite: NEGATIVE
Protein, ur: NEGATIVE mg/dL
Specific Gravity, Urine: 1.035 — ABNORMAL HIGH (ref 1.005–1.030)
pH: 5 (ref 5.0–8.0)

## 2020-08-24 LAB — POC OCCULT BLOOD, ED: Fecal Occult Bld: NEGATIVE

## 2020-08-24 LAB — HEMOGLOBIN AND HEMATOCRIT, BLOOD
HCT: 37.7 % — ABNORMAL LOW (ref 39.0–52.0)
Hemoglobin: 11.9 g/dL — ABNORMAL LOW (ref 13.0–17.0)

## 2020-08-24 NOTE — Discharge Instructions (Signed)
As we discussed, your rectal bleeding appears to be likely from hemorrhoids or diverticula.  Your hemoglobin and vital signs remained stable.  Your CT scan is reassuring.  Follow-up with your primary doctor as well as gastroenterologist.  Return to the ED with worsening rectal bleeding, abdominal pain, dizziness, lightheadedness, or any other concerns.

## 2020-08-24 NOTE — ED Provider Notes (Signed)
[facility]ERG[facility] DEPARTMENT Provider Note   CSN: [id number] Arrival date & time: 08/23/20  1958     History Chief Complaint  Patient presents with  . Rectal Bleeding    Dean Brown is a 75 y.o. male.  Patient with a history of prostate cancer, diabetes, hypertension, sleep apnea presenting with rectal bleeding.  States he was constipated for the past week or so.  He had a bowel movement 2 days ago and another 1 today that was loose.  He saw bright red blood when he wipes.  He is not certain what the stool itself looked like because he did not see it.  He states the toilet bowl do not turn red.  There is blood on the toilet paper when he wipes.  He does not know what color the stool was.  He does not know what color the stool was on Saturday either.  There is been no nausea vomiting, fever, chest pain or shortness of breath.  He takes aspirin but no other blood thinners.  Does have a history of acid reflux but denies any excessive naproxen or ibuprofen use.  Reports a colonoscopy several years ago that showed polyps.  Had some left-sided lower abdominal pain which is since resolved.  No dizziness or lightheadedness.  No chest pain.  No abdominal pain.  States he normally has a bowel movement every day but has been constipated for the past week.  Did take MiraLAX at home with some relief  The history is provided by the patient.  Rectal Bleeding Associated symptoms: abdominal pain   Associated symptoms: no dizziness, no fever and no vomiting        Past Medical History:  Diagnosis Date  . Anxiety   . Arthritis   . Cancer [facility])    Prostate cancer  . Depression   . Diabetes mellitus    type 2  . Dyspnea   . GERD (gastroesophageal reflux disease)   . Hypertension   . Pneumonia 12/03/2013  . Primary localized osteoarthritis of left hip 10/14/2019  . Sleep apnea    cpap sleep study - [facility]    Patient Active Problem List   Diagnosis Date Noted  . Status post  total hip replacement, left 11/04/2019  . Primary localized osteoarthritis of left hip 10/14/2019  . DM2 (diabetes mellitus, type 2) ([place]) 09/24/2018  . Essential hypertension 09/24/2018  . Hypokalemia 09/24/2018  . Chest pain 09/23/2018  . Pseudophakia of right eye 09/17/2018  . Rotator cuff syndrome of right shoulder 06/21/2018  . History of prostate cancer 12/04/2017  . Insomnia 09/12/2017  . ED (erectile dysfunction) 01/23/2017  . 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 Gross hematuria 11/16/2014  . 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 SIRS (systemic inflammatory response syndrome) ([place]) 12/03/2013  . Acid reflux 12/03/2013  . BP (high blood pressure) 12/03/2013  . Arthritis, degenerative 12/03/2013  . History of penile implant 06/16/2013  . HLD (hyperlipidemia) 03/08/2012  . Obstructive apnea 08/04/2011  . 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 ACHILLES TENDINITIS 11/10/2008  . PES PLANUS 11/10/2008  . HALLUX VALGUS 11/10/2008    Past Surgical History:  Procedure Laterality Date  . KNEE SURGERY Right 10/2012  . LUMBAR LAMINECTOMY/DECOMPRESSION MICRODISCECTOMY  11/01/2011   Procedure: LUMBAR LAMINECTOMY/DECOMPRESSION MICRODISCECTOMY;  Surgeon: Sinclair Ship, MD;  Location: [place];  Service: Orthopedics;  Laterality: Left;  Left sided lumbar 5- sacrum 1 microdisectomy  . microdiskectomy  11/01/2011  .  PROSTATE SURGERY     s/p cancer  . ROTATOR CUFF REPAIR     left shoulder  . TOTAL HIP ARTHROPLASTY Left 11/04/2019   Procedure: TOTAL HIP ARTHROPLASTY ANTERIOR APPROACH;  Surgeon: Renette Butters, MD;  Location: [facility];  Service: Orthopedics;  Laterality: Left;       Family History  Problem Relation Age of Onset  . Alzheimer's disease Mother   . Cancer Father     Social History   Tobacco Use  . Smoking status: Former Smoker    Quit date: 05/04/1993    Years since quitting: 27.3  . Smokeless tobacco: Never Used   Vaping Use  . Vaping Use: Never used  Substance Use Topics  . Alcohol use: Yes    Comment: ocassional  . Drug use: No    Home Medications Prior to Admission medications   Medication Sig Start Date End Date Taking? Authorizing Provider  amLODipine (NORVASC) 10 MG tablet Take 10 mg by mouth daily.    [provider]  buPROPion (WELLBUTRIN XL) 150 MG 24 hr tablet Take 150 mg by mouth daily.  07/31/18   [provider]  candesartan (ATACAND) 8 MG tablet Take 8 mg by mouth in the morning and at bedtime.  06/20/18   [provider]  celecoxib (CELEBREX) 200 MG capsule  11/25/19   [provider]  docusate sodium (COLACE) 100 MG capsule Take 100 mg by mouth once a week.    [provider]  DULoxetine (CYMBALTA) 60 MG capsule Take 60 mg by mouth daily.  07/31/18   [provider]  gabapentin (NEURONTIN) 300 MG capsule Take by mouth in the morning and at bedtime.  01/19/16   [provider]  glucagon (GLUCAGEN) 1 MG SOLR Inject 1 mg into the vein once as needed (severely low blood sugar).    [provider]  hydrochlorothiazide (HYDRODIURIL) 25 MG tablet Take 25 mg by mouth daily.  05/21/18   [provider]  LANTUS SOLOSTAR 100 UNIT/ML Solostar Pen Inject 26 Units into the skin at bedtime.  09/11/18   [provider]  meloxicam (MOBIC) 15 MG tablet Take 1 tablet (15 mg total) by mouth daily. 04/21/20   Edrick Kins, DPM  methylPREDNISolone acetate (DEPO-MEDROL) 40 MG/ML injection Inject 80 mg into the muscle once. 11/19/19   [provider]  metoprolol succinate (TOPROL-XL) 50 MG 24 hr tablet Take 50 mg by mouth daily.  05/21/18   [provider]  NONFORMULARY OR COMPOUNDED ITEM Antifungal solution: Terbinafine 3%, Fluconazole 2%, Tea Tree Oil 5%, Urea 10%, Ibuprofen 2% in DMSO suspension #15mL Patient not taking: Reported on 10/14/2019 01/13/19   Marzetta Board, DPM  omeprazole (PRILOSEC) 20 MG  capsule Take 20 mg by mouth daily. Take 1 capsule (20 mg) by mouth scheduled every morning, may take an additional dose in the evening if needed for acid reflux/indigestion.    [provider]  OZEMPIC, 1 MG/DOSE, 4 MG/3ML SOPN Inject 1 mg into the skin every Wednesday. 09/14/19   [provider]  pregabalin (LYRICA) 75 MG capsule  11/25/19   [provider]  rosuvastatin (CRESTOR) 40 MG tablet Take 40 mg by mouth at bedtime.  06/20/18   [provider]  sildenafil (VIAGRA) 100 MG tablet Take 100 mg by mouth daily as needed for erectile dysfunction.  06/19/19   [provider]    Allergies    Lisinopril and Metformin  Review of Systems   Review  of Systems  Constitutional: Negative for activity change, appetite change, fatigue and fever.  HENT: Negative for congestion and rhinorrhea.   Respiratory: Negative for cough, chest tightness and shortness of breath.   Cardiovascular: Negative for chest pain.  Gastrointestinal: Positive for abdominal pain, blood in stool and hematochezia. Negative for nausea and vomiting.  Genitourinary: Negative for dysuria and hematuria.  Musculoskeletal: Negative for arthralgias and myalgias.  Skin: Negative for wound.  Neurological: Negative for dizziness, weakness and headaches.   all other systems are negative except as noted in the HPI and PMH.    Physical Exam Updated Vital Signs BP 125/80 (BP Location: Right Arm)   Pulse 71   Temp 98.5 F (36.9 C) (Oral)   Resp 18   SpO2 97%   Physical Exam Vitals and nursing note reviewed.  Constitutional:      General: He is not in acute distress.    Appearance: He is well-developed.  HENT:     Head: Normocephalic and atraumatic.     Mouth/Throat:     Pharynx: No oropharyngeal exudate.  Eyes:     Conjunctiva/sclera: Conjunctivae normal.     Pupils: Pupils are equal, round, and reactive to light.  Neck:     Comments: No meningismus. Cardiovascular:     Rate and  Rhythm: Normal rate and regular rhythm.     Heart sounds: Normal heart sounds. No murmur heard.   Pulmonary:     Effort: Pulmonary effort is normal. No respiratory distress.     Breath sounds: Normal breath sounds.  Abdominal:     Palpations: Abdomen is soft.     Tenderness: There is abdominal tenderness. There is no guarding or rebound.     Comments: Mild left-sided lower abdominal pain.  Genitourinary:    Comments: No hemorrhoids or fissures.  No gross blood. Musculoskeletal:        General: No tenderness. Normal range of motion.     Cervical back: Normal range of motion and neck supple.  Skin:    General: Skin is warm.  Neurological:     Mental Status: He is alert and oriented to person, place, and time.     Cranial Nerves: No cranial nerve deficit.     Motor: No abnormal muscle tone.     Coordination: Coordination normal.     Comments:  5/5 strength throughout. CN 2-12 intact.Equal grip strength.   Psychiatric:        Behavior: Behavior normal.     ED Results / Procedures / Treatments   Labs (all labs ordered are listed, but only abnormal results are displayed) Labs Reviewed  COMPREHENSIVE METABOLIC PANEL - Abnormal; Notable for the following components:      Result Value   Glucose, Bld 121 (*)    Creatinine, Ser 1.37 (*)    GFR, Estimated 55 (*)    All other components within normal limits  URINALYSIS, ROUTINE W REFLEX MICROSCOPIC - Abnormal; Notable for the following components:   Specific Gravity, Urine 1.035 (*)    All other components within normal limits  HEMOGLOBIN AND HEMATOCRIT, BLOOD - Abnormal; Notable for the following components:   Hemoglobin 11.9 (*)    HCT 37.7 (*)    All other components within normal limits  CBC WITH DIFFERENTIAL/PLATELET  LIPASE, BLOOD  POC OCCULT BLOOD, ED    EKG None  Radiology CT Abdomen Pelvis W Contrast  Result Date: 08/23/2020 CLINICAL DATA:  Lower abdominal pain EXAM: CT ABDOMEN AND PELVIS WITH CONTRAST TECHNIQUE:  Multidetector  CT imaging of the abdomen and pelvis was performed using the standard protocol following bolus administration of intravenous contrast. CONTRAST:  171mL OMNIPAQUE IOHEXOL 300 MG/ML  SOLN COMPARISON:  None. FINDINGS: Lower chest: Lung bases demonstrate no acute consolidation or effusion. Borderline cardiomegaly. Hepatobiliary: Multiple hepatic cysts. Numerous subcentimeter hypodense liver lesions too small to further characterize. No calcified gallstone or biliary dilatation. Pancreas: Unremarkable. No pancreatic ductal dilatation or surrounding inflammatory changes. Spleen: Normal in size without focal abnormality. Adrenals/Urinary Tract: Adrenal glands are normal. Numerous subcentimeter hypodense lesions in the kidneys too small to further characterize. Several small renal cysts. The bladder is unremarkable Stomach/Bowel: The stomach is nonenlarged. No dilated small bowel. Negative appendix. No acute bowel wall thickening. Mild fluid within the left colon. Scattered colon diverticula without acute wall thickening. Vascular/Lymphatic: Mild aortic atherosclerosis. No aneurysm. No suspicious nodes Reproductive: Status post prostatectomy. Penile prosthesis with left pelvic reservoir. Other: Negative for free air or free fluid. Musculoskeletal: Post fusion changes L4 through S1 with surgical rods, pedicle screws and interbody devices. IMPRESSION: 1. No CT evidence for acute intra-abdominal or pelvic abnormality. Some fluid within the left colon, but no convincing bowel wall thickening. 2. Numerous hepatic cysts and additional subcentimeter hypodensities too small to further characterize. Electronically Signed   By: Donavan Foil M.D.   On: 08/23/2020 23:16    Procedures Procedures   Medications Ordered in ED Medications  iohexol (OMNIPAQUE) 300 MG/ML solution 100 mL (100 mLs Intravenous Contrast Given 08/23/20 2247)    ED Course  I have reviewed the triage vital signs and the nursing  notes.  Pertinent labs & imaging results that were available during my care of the patient were reviewed by me and considered in my medical decision making (see chart for details).    MDM Rules/Calculators/A&P                         Episode of rectal bleeding after having a bowel movement.  He denies straining.  Vitals are stable.  Hemoglobin is stable. No blood thinner use.   CT scan in triage shows no acute findings  Orthostatics are negative.  Hemoccult today is negative.  CT scan done in triage shows no acute intra-abdominal pathology.  There are some fluid within the left colon.  Patient remained stable.  Repeat hemoglobin is 11.9.  Hemoccult occult is negative.  Suspect likely hemorrhoidal versus diverticular bleeding.  Patient is stable and not on any blood thinners.  Patient tolerating p.o.  He is ambulatory.  Denies any pain.  Vitals remained stable.  Hemoccult is negative without any evidence of significant GI bleeding at this point. Advise follow-up with his PCP as well as gastroenterologist.  Advised patient to monitor his stools closely return if he has worsening symptoms including abdominal pain, dizziness, persistent rectal bleeding, lightheadedness, or other concerns. He understands and is anxious for discharge. Final Clinical Impression(s) / ED Diagnoses Final diagnoses:  Rectal bleeding    Rx / DC Orders ED Discharge Orders    None       Belynda Pagaduan, Annie Main, MD 08/24/20 [phone]

## 2020-09-06 ENCOUNTER — Institutional Professional Consult (permissible substitution): Payer: Medicare PPO | Admitting: Neurology

## 2020-10-11 ENCOUNTER — Ambulatory Visit: Payer: Medicare PPO | Admitting: Podiatry

## 2020-11-15 ENCOUNTER — Ambulatory Visit (INDEPENDENT_AMBULATORY_CARE_PROVIDER_SITE_OTHER): Payer: Medicare PPO | Admitting: Podiatry

## 2020-11-15 ENCOUNTER — Other Ambulatory Visit: Payer: Self-pay

## 2020-11-15 DIAGNOSIS — M79674 Pain in right toe(s): Secondary | ICD-10-CM | POA: Diagnosis not present

## 2020-11-15 DIAGNOSIS — L989 Disorder of the skin and subcutaneous tissue, unspecified: Secondary | ICD-10-CM

## 2020-11-15 DIAGNOSIS — M79675 Pain in left toe(s): Secondary | ICD-10-CM | POA: Diagnosis not present

## 2020-11-15 DIAGNOSIS — B351 Tinea unguium: Secondary | ICD-10-CM

## 2020-11-15 DIAGNOSIS — E0843 Diabetes mellitus due to underlying condition with diabetic autonomic (poly)neuropathy: Secondary | ICD-10-CM

## 2020-11-15 NOTE — Progress Notes (Signed)
   SUBJECTIVE Patient with a history of diabetes mellitus presents to office today complaining of elongated, thickened nails that cause pain while ambulating in shoes.  He is unable to trim his own nails. Patient is here for further evaluation and treatment.  Patient also relates a history of trauma to the right third toenail about 20+ years ago when in the [employer].  He states that the nail has been sensitive and painful ever since.  He would like to discuss more definitive treatment options to alleviate the symptoms from the toenail   Past Medical History:  Diagnosis Date   Anxiety    Arthritis    Cancer ([place])    Prostate cancer   Depression    Diabetes mellitus    type 2   Dyspnea    GERD (gastroesophageal reflux disease)    Hypertension    Pneumonia 12/03/2013   Primary localized osteoarthritis of left hip 10/14/2019   Sleep apnea    cpap sleep study - [place] Patient is awake, alert, and oriented x 3 and in no acute distress. Derm Skin is dry and supple bilateral. Negative open lesions or macerations. Remaining integument unremarkable. Nails are tender, long, thickened and dystrophic with subungual debris, consistent with onychomycosis, 1-5 bilateral. No signs of infection noted.  Hyperkeratotic preulcerative callus lesions noted bilateral feet Vasc  DP and PT pedal pulses palpable bilaterally. Temperature gradient within normal limits.  Neuro Epicritic and protective threshold sensation diminished bilaterally.  Musculoskeletal Exam No symptomatic pedal deformities noted bilateral. Muscular strength within normal limits.  ASSESSMENT 1. Diabetes Mellitus w/ peripheral neuropathy 2. Onychomycosis of nail due to dermatophyte bilateral 3. Pain in foot bilateral 4.  Preulcerative callus lesions bilateral feet 5.  Symptomatic dystrophic nail right third toe secondary to trauma 20+ years ago  [place] 1. Patient evaluated today. 2. Instructed to maintain  good pedal hygiene and foot care. Stressed importance of controlling blood sugar.  3. Mechanical debridement of nails 1-5 bilaterally performed using a nail nipper. Filed with dremel without incident.  4.  Excisional debridement of the hyperkeratotic preulcerative callus tissue was performed using a tissue nipper without incident or bleeding  5.  Patient states that he would like to pursue total nail matricectomy of the right third toe nail plate however not today.  We discussed the details of the procedure and the postprocedural recovery.  He would like to have this performed in about 2 weeks  6.  Return to clinic in 2 weeks for total permanent nail matricectomy of the right third toe dystrophic nail    Edrick Kins, DPM [facility]  Dr. Edrick Kins, DPM    [address], [place] [address]                Office [phone]  Fax [phone]

## 2020-12-06 ENCOUNTER — Other Ambulatory Visit: Payer: Self-pay

## 2020-12-06 ENCOUNTER — Ambulatory Visit (INDEPENDENT_AMBULATORY_CARE_PROVIDER_SITE_OTHER): Payer: Medicare PPO | Admitting: Podiatry

## 2020-12-06 DIAGNOSIS — M79674 Pain in right toe(s): Secondary | ICD-10-CM

## 2020-12-06 DIAGNOSIS — M79675 Pain in left toe(s): Secondary | ICD-10-CM | POA: Diagnosis not present

## 2020-12-06 DIAGNOSIS — E0843 Diabetes mellitus due to underlying condition with diabetic autonomic (poly)neuropathy: Secondary | ICD-10-CM

## 2020-12-06 DIAGNOSIS — L989 Disorder of the skin and subcutaneous tissue, unspecified: Secondary | ICD-10-CM | POA: Diagnosis not present

## 2020-12-06 DIAGNOSIS — B351 Tinea unguium: Secondary | ICD-10-CM | POA: Diagnosis not present

## 2020-12-06 NOTE — Progress Notes (Signed)
   SUBJECTIVE Patient with a history of diabetes mellitus presents to office today complaining of elongated, thickened nails that cause pain while ambulating in shoes.  He is unable to trim his own nails. Patient is here for further evaluation and treatment.  Patient also relates a history of trauma to the right third toenail about 20+ years ago when in the [employer].  He states that the nail has been sensitive and painful ever since.  He would like to discuss more definitive treatment options to alleviate the symptoms from the toenail   Past Medical History:  Diagnosis Date   Anxiety    Arthritis    Cancer ([place])    Prostate cancer   Depression    Diabetes mellitus    type 2   Dyspnea    GERD (gastroesophageal reflux disease)    Hypertension    Pneumonia 12/03/2013   Primary localized osteoarthritis of left hip 10/14/2019   Sleep apnea    cpap sleep study - [place] Patient is awake, alert, and oriented x 3 and in no acute distress. Derm Skin is dry and supple bilateral. Negative open lesions or macerations. Remaining integument unremarkable. Nails are tender, long, thickened and dystrophic with subungual debris, consistent with onychomycosis, 1-5 bilateral. No signs of infection noted.  Hyperkeratotic preulcerative callus lesions noted bilateral feet Vasc  DP and PT pedal pulses palpable bilaterally. Temperature gradient within normal limits.  Neuro Epicritic and protective threshold sensation diminished bilaterally.  Musculoskeletal Exam No symptomatic pedal deformities noted bilateral. Muscular strength within normal limits.  ASSESSMENT 1. Diabetes Mellitus w/ peripheral neuropathy 2. Onychomycosis of nail due to dermatophyte bilateral 3. Pain in foot bilateral 4.  Preulcerative callus lesions bilateral feet 5.  Symptomatic dystrophic nail right third toe secondary to trauma 20+ years ago; currently asymptomatic  PLAN OF CARE 1. Patient evaluated today. 2.  Instructed to maintain good pedal hygiene and foot care. Stressed importance of controlling blood sugar.  3. Mechanical debridement of nails 1-5 bilaterally performed using a nail nipper. Filed with dremel without incident.  4.  Excisional debridement of the hyperkeratotic preulcerative callus tissue was performed using a tissue nipper without incident or bleeding  5.  Return to clinic in 3 months.  Patient states that at that time he would like to have his right ankle evaluated.  He would not like to have it done today because he has another appointment this afternoon.  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 Edrick Kins, DPM    [address], [place] [address]                Office [phone]  Fax [phone]

## 2021-02-04 ENCOUNTER — Telehealth: Payer: Self-pay

## 2021-02-04 NOTE — Telephone Encounter (Signed)
NOTES SCANNED TO REFERRAL 

## 2021-03-09 ENCOUNTER — Ambulatory Visit: Payer: Medicare PPO | Admitting: Podiatry

## 2021-03-14 ENCOUNTER — Ambulatory Visit: Admitting: Internal Medicine

## 2021-03-14 NOTE — Progress Notes (Deleted)
Cardiology Office Note:    Date:  03/14/2021   ID:  Dean Brown, DOB [birth year], MRN [id number]  PCP:  Enid Derry, PA-C   [facility] Providers Cardiologist:  None { Click to update primary MD,subspecialty MD or APP then REFRESH:1}    Referring MD: Madelin Rear, MD   No chief complaint on file. Intermittent palpitations and heart murmur  History of Present Illness:    Dean Brown is a 75 y.o. male with a hx of chest pain seen by [facility], ACS rule out 09/24/2018; echo was normal, he is referred from his PCP for palpitations    Cardiology Studies: 09/24/2018 TTE nl   Labs 09/07/2020 A1c 6.0% LDL 131 TSH 1.3  Past Medical History:  Diagnosis Date   Anxiety    Arthritis    Cancer ([place])    Prostate cancer   Depression    Diabetes mellitus    type 2   Dyspnea    GERD (gastroesophageal reflux disease)    Hypertension    Pneumonia 12/03/2013   Primary localized osteoarthritis of left hip 10/14/2019   Sleep apnea    cpap sleep study - [facility]    Past Surgical History:  Procedure Laterality Date   KNEE SURGERY Right 10/2012   LUMBAR LAMINECTOMY/DECOMPRESSION MICRODISCECTOMY  11/01/2011   Procedure: LUMBAR LAMINECTOMY/DECOMPRESSION MICRODISCECTOMY;  Surgeon: Sinclair Ship, MD;  Location: [facility];  Service: Orthopedics;  Laterality: Left;  Left sided lumbar 5- sacrum 1 microdisectomy   microdiskectomy  11/01/2011   PROSTATE SURGERY     s/p cancer   ROTATOR CUFF REPAIR     left shoulder   TOTAL HIP ARTHROPLASTY Left 11/04/2019   Procedure: TOTAL HIP ARTHROPLASTY ANTERIOR APPROACH;  Surgeon: Renette Butters, MD;  Location: [facility];  Service: Orthopedics;  Laterality: Left;    Current Medications: No outpatient medications have been marked as taking for the 03/14/21 encounter (Appointment) with Janina Mayo, MD.     Allergies:   Lisinopril and Metformin   Social History   Socioeconomic History   Marital status: Married    Spouse  name: Not on file   Number of children: Not on file   Years of education: Not on file   Highest education level: Not on file  Occupational History   Not on file  Tobacco Use   Smoking status: Former    Types: Cigarettes    Quit date: 05/04/1993    Years since quitting: 27.8   Smokeless tobacco: Never  Vaping Use   Vaping Use: Never used  Substance and Sexual Activity   Alcohol use: Yes    Comment: ocassional   Drug use: No   Sexual activity: Yes  Other Topics Concern   Not on file  Social History Narrative   Not on file   Social Determinants of Health   Financial Resource Strain: Not on file  Food Insecurity: Not on file  Transportation Needs: Not on file  Physical Activity: Not on file  Stress: Not on file  Social Connections: Not on file     Family History: The patient's ***family history includes Alzheimer's disease in his mother; Cancer in his father.  ROS:   Please see the history of present illness.    *** All other systems reviewed and are negative.  EKGs/Labs/Other Studies Reviewed:    The following studies were reviewed today: ***  EKG:  EKG is *** ordered today.  The ekg ordered today demonstrates ***  Recent Labs: 08/23/2020:  ALT 21; BUN 12; Creatinine, Ser 1.37; Platelets 276; Potassium 4.3; Sodium 142 08/24/2020: Hemoglobin 11.9  Recent Lipid Panel    Component Value Date/Time   CHOL 106 09/24/2018 0209   TRIG 100 09/24/2018 0209   HDL 37 (L) 09/24/2018 0209   CHOLHDL 2.9 09/24/2018 0209   VLDL 20 09/24/2018 0209   LDLCALC 49 09/24/2018 0209     Risk Assessment/Calculations:   {Does this patient have ATRIAL FIBRILLATION?:[phone]}       Physical Exam:    VS:  There were no vitals taken for this visit.    Wt Readings from Last 3 Encounters:  11/04/19 176 lb 1 oz (79.9 kg)  10/22/19 (P) 176 lb 1 oz (79.9 kg)  09/23/18 173 lb (78.5 kg)     GEN: *** Well nourished, well developed in no acute distress HEENT: Normal NECK: No JVD; No  carotid bruits LYMPHATICS: No lymphadenopathy CARDIAC: ***RRR, no murmurs, rubs, gallops RESPIRATORY:  Clear to auscultation without rales, wheezing or rhonchi  ABDOMEN: Soft, non-tender, non-distended MUSCULOSKELETAL:  No edema; No deformity  SKIN: Warm and dry NEUROLOGIC:  Alert and oriented x 3 PSYCHIATRIC:  Normal affect   ASSESSMENT:    No diagnosis found. PLAN:    In order of problems listed above:  ***      {Are you ordering a CV Procedure (e.g. stress test, cath, DCCV, TEE, etc)?   Press F2        :[id number]}    Medication Adjustments/Labs and Tests Ordered: Current medicines are reviewed at length with the patient today.  Concerns regarding medicines are outlined above.  No orders of the defined types were placed in this encounter.  No orders of the defined types were placed in this encounter.   There are no Patient Instructions on file for this visit.   Signed, Janina Mayo, MD  03/14/2021 12:34 PM    [facility]

## 2021-03-30 NOTE — Progress Notes (Signed)
Cardiology Office Note:   Date:  03/31/2021  NAME:  Dean Brown    MRN: [id number] DOB:  04-03-1946   PCP:  Enid Derry, PA-C  Cardiologist:  None  Electrophysiologist:  None   Referring MD: Madelin Rear, MD   Chief Complaint  Patient presents with   Heart Murmur         History of Present Illness:   Dean Brown is a 75 y.o. male with a hx of DM, HTN, HLD, OSA who is being seen today for the evaluation of palpitations/murmur at the request of Madelin Rear, MD. Has been seen by [facility] in 12/08/2020. Echo in 2018 normal.  He reports he was referred to [place] for evaluation of heart murmur.  Echo cardiogram from 2018 was unremarkable.  He has a mild systolic ejection murmur which I suspect is benign.  He does have a medical history of diabetes and hypertension.  He also has elevated cholesterol.  Blood pressure 150/78.  Apparently he ran out of HCTZ and this has not been refilled.  I suspect this is the reason for his elevated blood pressure.  He is diabetic with an A1c of 6.8.  Most recent LDL cholesterol 129.  He has been maintained on Crestor 40 mg daily.  Given his diabetes would like his cholesterol lower.  We discussed Zetia and he is willing to do this.  He informs me he is not active.  He does work as a Psychologist, occupational for [employer].  He works on [employer] side.  He is retired from [employer].  He put 20 or more years into the [employer].  I do not have records from the [place] but he will get them for me.  His EKG demonstrates sinus rhythm with right bundle branch block and left anterior fascicular block.  He was followed by [facility] cardiology for palpitations and atrial tachycardia seen briefly.  He has no symptoms of this today.  He has been treated with metoprolol.  He denies any history of heart attack or stroke.  He denies any symptoms of exertional chest pain or pressure.  He does report heavy activity can get him out of breath.  He is  extremely inactive.  He informs me he has no plans to really become more active but we did talk about the benefits of exercise.  There is no strong family history of heart disease.  He is a former smoker.  He does not drink alcohol or use drugs.  Problem List DM -A1c 6.8  HTN HLD -T chol 192, HDL 50, LDL 129, TG 63 OSA RBBB/LAFB Atrial tachycardia -brief episodes treated at [facility]   Past Medical History: Past Medical History:  Diagnosis Date   Anxiety    Arthritis    Cancer ([place])    Prostate cancer   Depression    Diabetes mellitus    type 2   Dyspnea    GERD (gastroesophageal reflux disease)    Hypertension    Pneumonia 12/03/2013   Primary localized osteoarthritis of left hip 10/14/2019   Sleep apnea    cpap sleep study - [facility]    Past Surgical History: Past Surgical History:  Procedure Laterality Date   KNEE SURGERY Right 10/2012   LUMBAR LAMINECTOMY/DECOMPRESSION MICRODISCECTOMY  11/01/2011   Procedure: LUMBAR LAMINECTOMY/DECOMPRESSION MICRODISCECTOMY;  Surgeon: Sinclair Ship, MD;  Location: [place];  Service: Orthopedics;  Laterality: Left;  Left sided lumbar 5- sacrum 1 microdisectomy   microdiskectomy  11/01/2011   PROSTATE SURGERY     s/p cancer   ROTATOR CUFF REPAIR     left shoulder   TOTAL HIP ARTHROPLASTY Left 11/04/2019   Procedure: TOTAL HIP ARTHROPLASTY ANTERIOR APPROACH;  Surgeon: Renette Butters, MD;  Location: [facility];  Service: Orthopedics;  Laterality: Left;    Current Medications: Current Meds  Medication Sig   amLODipine (NORVASC) 10 MG tablet Take 10 mg by mouth daily.   buPROPion (WELLBUTRIN XL) 150 MG 24 hr tablet Take 150 mg by mouth daily.    candesartan (ATACAND) 8 MG tablet Take 8 mg by mouth in the morning and at bedtime.    celecoxib (CELEBREX) 200 MG capsule    docusate sodium (COLACE) 100 MG capsule Take 100 mg by mouth once a week.   DULoxetine (CYMBALTA) 60 MG capsule Take 60 mg by mouth daily.    ezetimibe (ZETIA) 10 MG tablet  Take 1 tablet (10 mg total) by mouth daily.   gabapentin (NEURONTIN) 300 MG capsule Take by mouth in the morning and at bedtime.    glucagon (GLUCAGEN) 1 MG SOLR injection Inject 1 mg into the vein once as needed (severely low blood sugar).   LANTUS SOLOSTAR 100 UNIT/ML Solostar Pen Inject 26 Units into the skin at bedtime.    meloxicam (MOBIC) 15 MG tablet Take 1 tablet (15 mg total) by mouth daily.   methylPREDNISolone acetate (DEPO-MEDROL) 40 MG/ML injection Inject 80 mg into the muscle once.   metoprolol succinate (TOPROL-XL) 50 MG 24 hr tablet Take 50 mg by mouth daily.    NONFORMULARY OR COMPOUNDED ITEM Antifungal solution: Terbinafine 3%, Fluconazole 2%, Tea Tree Oil 5%, Urea 10%, Ibuprofen 2% in DMSO suspension #100mL   omeprazole (PRILOSEC) 20 MG capsule Take 20 mg by mouth daily. Take 1 capsule (20 mg) by mouth scheduled every morning, may take an additional dose in the evening if needed for acid reflux/indigestion.   OZEMPIC, 1 MG/DOSE, 4 MG/3ML SOPN Inject 1 mg into the skin every Wednesday.   pregabalin (LYRICA) 75 MG capsule    rosuvastatin (CRESTOR) 40 MG tablet Take 40 mg by mouth at bedtime.    sildenafil (VIAGRA) 100 MG tablet Take 100 mg by mouth daily as needed for erectile dysfunction.    [DISCONTINUED] hydrochlorothiazide (HYDRODIURIL) 25 MG tablet Take 25 mg by mouth daily.      Allergies:    Lisinopril and Metformin   Social History: Social History   Socioeconomic History   Marital status: Married    Spouse name: Not on file   Number of children: 5   Years of education: Not on file   Highest education level: Not on file  Occupational History   Occupation: Retired Company secretary - Building control surveyor  Tobacco Use   Smoking status: Former    Years: 30.00    Types: Pipe, Cigarettes    Quit date: 05/04/1993    Years since quitting: 27.9   Smokeless tobacco: Never  Vaping Use   Vaping Use: Never used  Substance and Sexual Activity   Alcohol use: Yes     Comment: ocassional   Drug use: No   Sexual activity: Yes  Other Topics Concern   Not on file  Social History Narrative   Not on file   Social Determinants of Health   Financial Resource Strain: Not on file  Food Insecurity: Not on file  Transportation Needs: Not on file  Physical Activity: Not on file  Stress: Not on file  Social  Connections: Not on file     Family History: The patient'sfamily history includes Alzheimer's disease in his mother; Cancer in his father; Heart disease in his brother, paternal grandmother, and sister.  ROS:   All other ROS reviewed and negative. Pertinent positives noted in the HPI.     EKGs/Labs/Other Studies Reviewed:   The following studies were personally reviewed by me today:  EKG:  EKG is ordered today.  The ekg ordered today demonstrates normal sinus rhythm heart rate 72, right bundle branch block, left anterior fascicular block, and was personally reviewed by me.   Recent Labs: 08/23/2020: ALT 21; BUN 12; Creatinine, Ser 1.37; Platelets 276; Potassium 4.3; Sodium 142 08/24/2020: Hemoglobin 11.9   Recent Lipid Panel    Component Value Date/Time   CHOL 106 09/24/2018 0209   TRIG 100 09/24/2018 0209   HDL 37 (L) 09/24/2018 0209   CHOLHDL 2.9 09/24/2018 0209   VLDL 20 09/24/2018 0209   LDLCALC 49 09/24/2018 0209    Physical Exam:   VS:  BP (!) 150/78   Pulse 72   Ht 5\' 6"  (1.676 m)   Wt 177 lb 9.6 oz (80.6 kg)   SpO2 98%   BMI 28.67 kg/m    Wt Readings from Last 3 Encounters:  03/31/21 177 lb 9.6 oz (80.6 kg)  11/04/19 176 lb 1 oz (79.9 kg)  10/22/19 (P) 176 lb 1 oz (79.9 kg)    General: Well nourished, well developed, in no acute distress Head: Atraumatic, normal size  Eyes: PEERLA, EOMI  Neck: Supple, no JVD Endocrine: No thryomegaly Cardiac: Normal S1, S2; RRR; faint 2/6 SEM Lungs: Clear to auscultation bilaterally, no wheezing, rhonchi or rales  Abd: Soft, nontender, no hepatomegaly  Ext: No edema, pulses  2+ Musculoskeletal: No deformities, BUE and BLE strength normal and equal Skin: Warm and dry, no rashes   Neuro: Alert and oriented to person, place, time, and situation, CNII-XII grossly intact, no focal deficits  Psych: Normal mood and affect   ASSESSMENT:   Dean Brown is a 75 y.o. male who presents for the following: 1. Murmur   2. Palpitations   3. SOB (shortness of breath)   4. Hyperlipidemia, unspecified hyperlipidemia type   5. Primary hypertension     PLAN:   1. Murmur -Faint 2 out of 6 systolic ejection murmur.  We will recheck an echo.  Suspect this is benign.  Echo from 2018 at [facility] was unremarkable.  2. Palpitations -History of palpitations.  Followed at [facility].  Echo was unremarkable there in 2018.  He is on metoprolol succinate 50 mg daily.  Symptoms are well controlled.  There was mention of a brief atrial tachycardia in the past.  I see no recurrence of this.  3. SOB (shortness of breath) -He reports he gets short of breath with activity.  He is physically inactive.  Highly suspect this is all related to deconditioning.  He has no evidence of volume overload on exam.  There is no evidence of congestive heart failure.  I would like for him to get more active.  He will work on this.  We will update an echocardiogram in our system to make sure everything is unchanged from what was seen at [facility] in 2018.  4. Hyperlipidemia, unspecified hyperlipidemia type -He is diabetic.  He is a former smoker.  I believe his LDL cholesterol should be less than 70.  Most recent value 129.  Continue Crestor 40 mg daily.  We will add Zetia  10 mg daily.  I will see him back in 3 months and we will obtain a fasting lipid before that appointment.  5. HTN -BP elevated today.  He has been out of HCTZ.  We will refill HCTZ 25 mg daily.  Would continue amlodipine 10 mg daily, candesartan 8 mg twice daily, metoprolol succinate 50 mg daily. -I would like for him to start checking his blood pressure  daily.  He has a cuff.  He will do this. -We discussed low-sodium as well as exercise.  Physical inactivity is a big issue for him.  Disposition: No follow-ups on file.  Medication Adjustments/Labs and Tests Ordered: Current medicines are reviewed at length with the patient today.  Concerns regarding medicines are outlined above.  Orders Placed This Encounter  Procedures   Lipid panel   EKG 12-Lead   ECHOCARDIOGRAM COMPLETE   Meds ordered this encounter  Medications   ezetimibe (ZETIA) 10 MG tablet    Sig: Take 1 tablet (10 mg total) by mouth daily.    Dispense:  90 tablet    Refill:  3   hydrochlorothiazide (HYDRODIURIL) 25 MG tablet    Sig: Take 1 tablet (25 mg total) by mouth daily.    Dispense:  90 tablet    Refill:  1    Patient Instructions  Medication Instructions:  START Zetia 10 mg daily  The current medical regimen is effective;  continue present plan and medications.  *If you need a refill on your cardiac medications before your next appointment, please call your pharmacy*   Lab Work: LIPID (1 week before 3 month appointment, no lab appointment needed, come fasting- nothing to eat or drink)   If you have labs (blood work) drawn today and your tests are completely normal, you will receive your results only by: August (if you have MyChart) OR A paper copy in the mail If you have any lab test that is abnormal or we need to change your treatment, we will call you to review the results.   Testing/Procedures: Echocardiogram - Your physician has requested that you have an echocardiogram. Echocardiography is a painless test that uses sound waves to create images of your heart. It provides your doctor with information about the size and shape of your heart and how well your heart's chambers and valves are working. This procedure takes approximately one hour. There are no restrictions for this procedure. This will be performed at either our [facility] location -  [address], [place] location [facility] 2nd floor.    Follow-Up: At [facility], you and your health needs are our priority.  As part of our continuing mission to provide you with exceptional heart care, we have created designated Provider Care Teams.  These Care Teams include your primary Cardiologist (physician) and Advanced Practice Providers (APPs -  Physician Assistants and Nurse Practitioners) who all work together to provide you with the care you need, when you need it.  We recommend signing up for the patient portal called "MyChart".  Sign up information is provided on this After Visit Summary.  MyChart is used to connect with patients for Virtual Visits (Telemedicine).  Patients are able to view lab/test results, encounter notes, upcoming appointments, etc.  Non-urgent messages can be sent to your provider as well.   To learn more about what you can do with MyChart, go to [email or web address].    Your next appointment:   3 month(s)  The format  for your next appointment:   In Person  Provider:   Eleonore Chiquito, MD    Other Instructions Take BP once daily   Signed, Addison Naegeli. Audie Box, MD, [place]  [address] [place], [place] [address] [phone]  03/31/2021 4:46 PM

## 2021-03-31 ENCOUNTER — Encounter: Payer: Self-pay | Admitting: Cardiovascular Disease

## 2021-03-31 ENCOUNTER — Ambulatory Visit (INDEPENDENT_AMBULATORY_CARE_PROVIDER_SITE_OTHER): Payer: Medicare PPO | Admitting: Cardiovascular Disease

## 2021-03-31 ENCOUNTER — Other Ambulatory Visit: Payer: Self-pay

## 2021-03-31 VITALS — BP 150/78 | HR 72 | Ht 66.0 in | Wt 177.6 lb

## 2021-03-31 DIAGNOSIS — E785 Hyperlipidemia, unspecified: Secondary | ICD-10-CM | POA: Diagnosis not present

## 2021-03-31 DIAGNOSIS — R002 Palpitations: Secondary | ICD-10-CM

## 2021-03-31 DIAGNOSIS — I10 Essential (primary) hypertension: Secondary | ICD-10-CM

## 2021-03-31 DIAGNOSIS — R0602 Shortness of breath: Secondary | ICD-10-CM

## 2021-03-31 DIAGNOSIS — R011 Cardiac murmur, unspecified: Secondary | ICD-10-CM | POA: Diagnosis not present

## 2021-03-31 MED ORDER — HYDROCHLOROTHIAZIDE 25 MG PO TABS: 25.0000 mg | ORAL_TABLET | Freq: Every day | ORAL | 1 refills | Status: AC

## 2021-03-31 MED ORDER — EZETIMIBE 10 MG PO TABS: 10.0000 mg | ORAL_TABLET | Freq: Every day | ORAL | 3 refills | Status: AC

## 2021-03-31 NOTE — Patient Instructions (Signed)
Medication Instructions:  START Zetia 10 mg daily  The current medical regimen is effective;  continue present plan and medications.  *If you need a refill on your cardiac medications before your next appointment, please call your pharmacy*   Lab Work: LIPID (1 week before 3 month appointment, no lab appointment needed, come fasting- nothing to eat or drink)   If you have labs (blood work) drawn today and your tests are completely normal, you will receive your results only by: [place] (if you have MyChart) OR A paper copy in the mail If you have any lab test that is abnormal or we need to change your treatment, we will call you to review the results.   Testing/Procedures: Echocardiogram - Your physician has requested that you have an echocardiogram. Echocardiography is a painless test that uses sound waves to create images of your heart. It provides your doctor with information about the size and shape of your heart and how well your heart's chambers and valves are working. This procedure takes approximately one hour. There are no restrictions for this procedure. This will be performed at either our [facility] location - [address] [place] location [facility] 2nd floor.    Follow-Up: At [facility], you and your health needs are our priority.  As part of our continuing mission to provide you with exceptional heart care, we have created designated Provider Care Teams.  These Care Teams include your primary Cardiologist (physician) and Advanced Practice Providers (APPs -  Physician Assistants and Nurse Practitioners) who all work together to provide you with the care you need, when you need it.  We recommend signing up for the patient portal called "MyChart".  Sign up information is provided on this After Visit Summary.  MyChart is used to connect with patients for Virtual Visits (Telemedicine).  Patients are able to view lab/test results,  encounter notes, upcoming appointments, etc.  Non-urgent messages can be sent to your provider as well.   To learn more about what you can do with MyChart, go to [email or web address].    Your next appointment:   3 month(s)  The format for your next appointment:   In Person  Provider:   Eleonore Chiquito, MD    Other Instructions Take BP once daily

## 2021-04-04 ENCOUNTER — Ambulatory Visit: Payer: Medicare PPO | Admitting: Podiatry

## 2021-04-22 ENCOUNTER — Other Ambulatory Visit: Payer: Self-pay

## 2021-04-22 ENCOUNTER — Ambulatory Visit (HOSPITAL_COMMUNITY): Payer: Medicare PPO | Attending: Cardiovascular Disease

## 2021-04-22 DIAGNOSIS — R0602 Shortness of breath: Secondary | ICD-10-CM | POA: Insufficient documentation

## 2021-04-22 LAB — ECHOCARDIOGRAM COMPLETE
Area-P 1/2: 4.21 cm2
S' Lateral: 2.7 cm

## 2021-04-27 ENCOUNTER — Telehealth: Payer: Self-pay | Admitting: Cardiovascular Disease

## 2021-04-27 NOTE — Telephone Encounter (Signed)
Pt is f/u on Echo results

## 2021-04-27 NOTE — Telephone Encounter (Signed)
pt aware of results  

## 2021-04-28 ENCOUNTER — Other Ambulatory Visit: Payer: Self-pay | Admitting: Rehabilitation

## 2021-04-28 DIAGNOSIS — M546 Pain in thoracic spine: Secondary | ICD-10-CM

## 2021-05-11 ENCOUNTER — Ambulatory Visit (INDEPENDENT_AMBULATORY_CARE_PROVIDER_SITE_OTHER): Payer: Medicare PPO | Admitting: Podiatry

## 2021-05-11 ENCOUNTER — Other Ambulatory Visit: Payer: Self-pay

## 2021-05-11 DIAGNOSIS — E0843 Diabetes mellitus due to underlying condition with diabetic autonomic (poly)neuropathy: Secondary | ICD-10-CM | POA: Diagnosis not present

## 2021-05-11 DIAGNOSIS — B351 Tinea unguium: Secondary | ICD-10-CM | POA: Diagnosis not present

## 2021-05-11 DIAGNOSIS — M79675 Pain in left toe(s): Secondary | ICD-10-CM

## 2021-05-11 DIAGNOSIS — L989 Disorder of the skin and subcutaneous tissue, unspecified: Secondary | ICD-10-CM | POA: Diagnosis not present

## 2021-05-11 DIAGNOSIS — M79674 Pain in right toe(s): Secondary | ICD-10-CM | POA: Diagnosis not present

## 2021-05-15 ENCOUNTER — Ambulatory Visit
Admission: RE | Admit: 2021-05-15 | Discharge: 2021-05-15 | Disposition: A | Discharge status: Home / Self Care | Payer: Medicare PPO | Source: Ambulatory Visit | Attending: Rehabilitation | Admitting: Rehabilitation

## 2021-05-15 ENCOUNTER — Other Ambulatory Visit: Payer: Self-pay

## 2021-05-15 DIAGNOSIS — M546 Pain in thoracic spine: Secondary | ICD-10-CM

## 2021-05-20 NOTE — Progress Notes (Signed)
° °  SUBJECTIVE Patient with a history of diabetes mellitus presents to office today complaining of elongated, thickened nails that cause pain while ambulating in shoes.  He is unable to trim his own nails. Patient is here for further evaluation and treatment.  Last visit the patient related a history of trauma to the right third toenail about 20+ years ago when in the [employer].  He states that the nail has been sensitive and painful ever since.  He says that currently it is not too painful.    Past Medical History:  Diagnosis Date   Anxiety    Arthritis    Cancer ([place])    Prostate cancer   Depression    Diabetes mellitus    type 2   Dyspnea    GERD (gastroesophageal reflux disease)    Hypertension    Pneumonia 12/03/2013   Primary localized osteoarthritis of left hip 10/14/2019   Sleep apnea    cpap sleep study - [place] Patient is awake, alert, and oriented x 3 and in no acute distress. Derm Skin is dry and supple bilateral. Negative open lesions or macerations. Remaining integument unremarkable. Nails are tender, long, thickened and dystrophic with subungual debris, consistent with onychomycosis, 1-5 bilateral. No signs of infection noted.  Hyperkeratotic preulcerative callus lesions noted bilateral feet Vasc  DP and PT pedal pulses palpable bilaterally. Temperature gradient within normal limits.  Neuro Epicritic and protective threshold sensation diminished bilaterally.  Musculoskeletal Exam No symptomatic pedal deformities noted bilateral. Muscular strength within normal limits.  ASSESSMENT 1. Diabetes Mellitus w/ peripheral neuropathy 2. Onychomycosis of nail due to dermatophyte bilateral 3. Pain in foot bilateral 4.  Preulcerative callus lesions bilateral feet 5.  Symptomatic dystrophic nail right third toe secondary to trauma 20+ years ago; currently asymptomatic  PLAN OF CARE 1. Patient evaluated today. 2. Instructed to maintain good pedal hygiene and  foot care. Stressed importance of controlling blood sugar.  3. Mechanical debridement of nails 1-5 bilaterally performed using a nail nipper. Filed with dremel without incident.  4.  Excisional debridement of the hyperkeratotic preulcerative callus tissue was performed using a tissue nipper without incident or bleeding  5.  Return to clinic in 3 months.  Patient states that his right ankle is doing well for him right now.  He did suffer from right ankle pain for several years while being in the service in the [place] but he would not felt like to have it evaluated today.  Edrick Kins, DPM [facility]  Dr. Edrick Kins, DPM    [address], [place] [address]                Office [phone]  Fax [phone]

## 2021-06-17 IMAGING — RF DG HIP (WITH PELVIS) OPERATIVE*L*
1 series · 4 of 4 positions shown · non-contrast
Comparison: None.

CLINICAL DATA: Left hip replacement

EXAM:
OPERATIVE LEFT HIP (WITH PELVIS IF PERFORMED) 4 VIEWS
TECHNIQUE: Fluoroscopic spot image(s) were submitted for interpretation
post-operatively.

[Series 1: unknown protocol · 0.20mm/px · 4 of 4 slices shown]
[im 1/4]
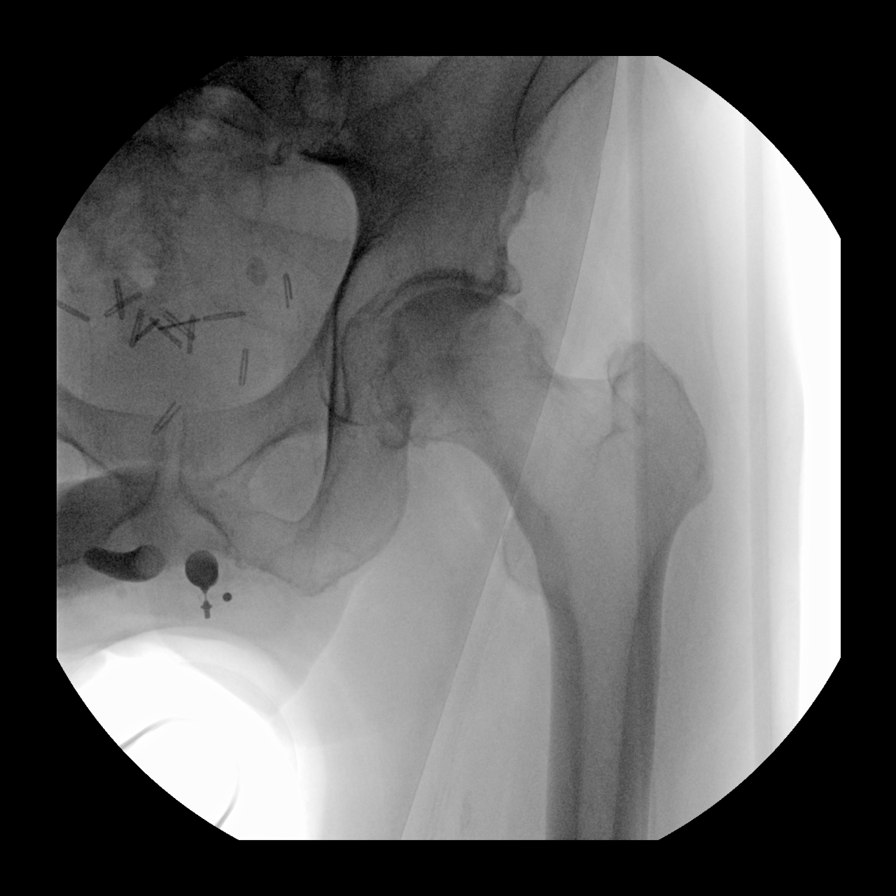
[im 2/4]
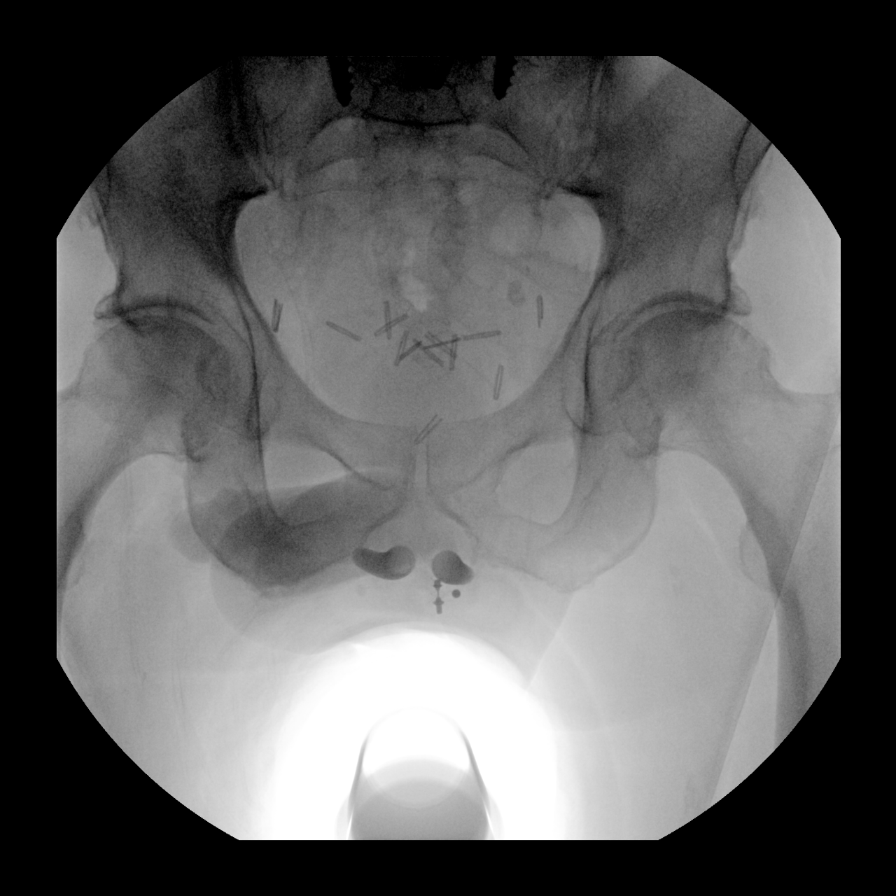
[im 3/4]
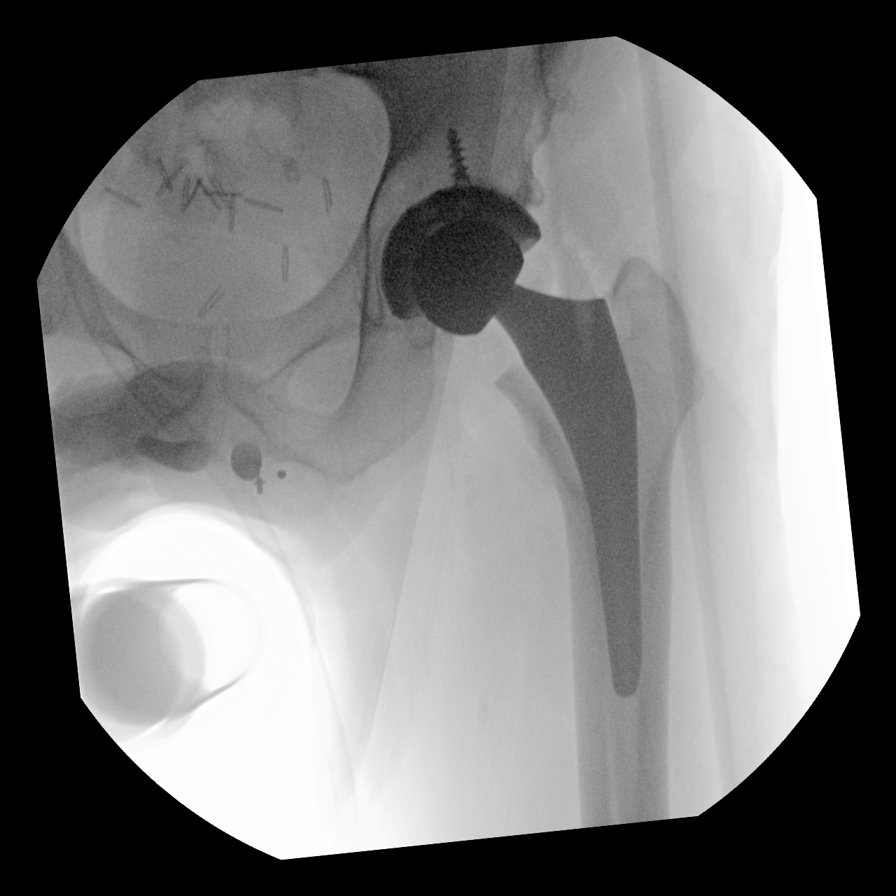
[im 4/4]
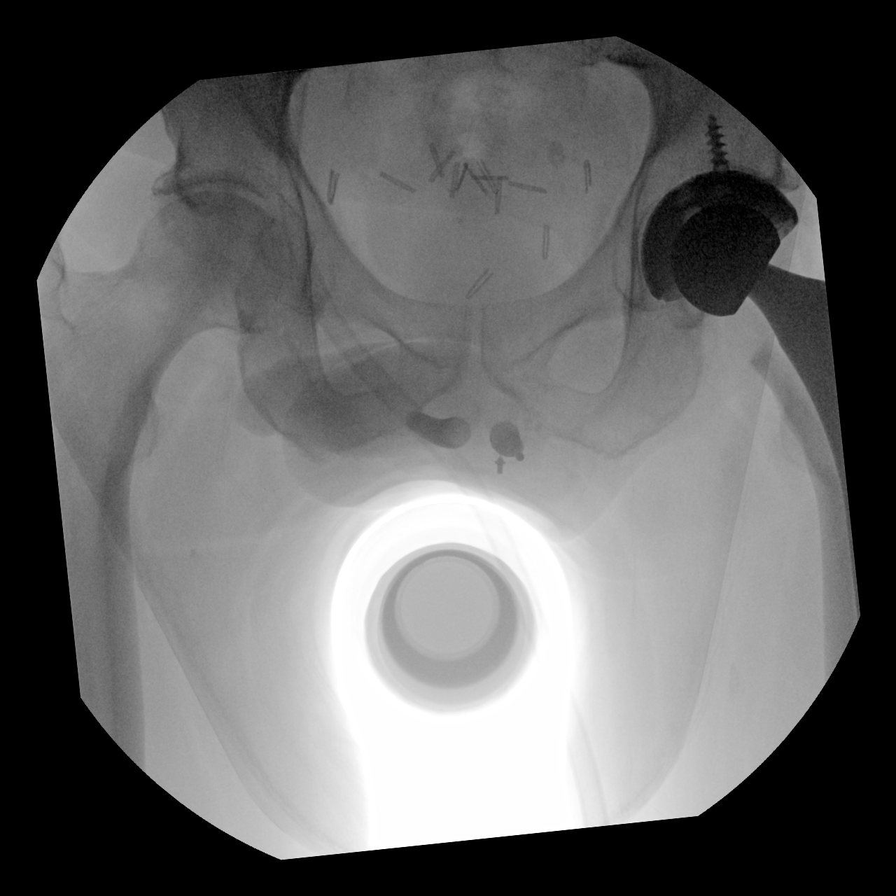

[4 of 4 positions shown; findings below may reference images not displayed]

FINDINGS: Changes of left hip replacement. Normal AP alignment. No hardware
complicating feature.
IMPRESSION: Left hip replacement.  No visible complicating feature.

## 2021-06-20 ENCOUNTER — Other Ambulatory Visit: Payer: Self-pay | Admitting: Internal Medicine

## 2021-07-14 NOTE — Progress Notes (Signed)
?Cardiology Office Note:   ?Date:  07/15/2021  ?NAME:  Dean Brown    ?MRN: [id number] ?DOB:  Sep 25, 1945  ? ?PCP:  Enid Derry, PA-C  ?Cardiologist:  None  ?Electrophysiologist:  None  ? ?Referring MD: Enid Derry, *  ? ?Chief Complaint  ?Patient presents with  ? Follow-up  ?   ?  ? ?History of Present Illness:   ?Dean Brown is a 76 y.o. male with a hx of DM, HTN, Hld who presents for follow-up.  He reports he is doing well.  On Crestor 40 mg daily.  Also on Zetia 10 mg daily.  Given his diabetes we discussed getting his cholesterol lower.  Recent PCP evaluation did not include cholesterol check.  Blood pressure 120/86.  Denies any chest pain or trouble breathing.  Can get winded when he does heavy activity.  He will have back surgery.  Given lack of symptoms he can proceed.  He is on aspirin and will need to hold this several days before surgery.  No palpitations or rapid heartbeat sensation.  He does have a history of atrial tachycardia.  No recurrence.  Overall doing quite well without major symptoms.  Needs repeat lipid profile. ? ?Problem List ?DM ?-A1c 6.8  ?HTN ?HLD ?-T chol 192, HDL 50, LDL 129, TG 63 ?OSA ?RBBB/LAFB ?Atrial tachycardia ?-brief episodes treated at [facility]  ? ?Past Medical History: ?Past Medical History:  ?Diagnosis Date  ? Anxiety   ? Arthritis   ? Cancer [facility])   ? Prostate cancer  ? Depression   ? Diabetes mellitus   ? type 2  ? Dyspnea   ? GERD (gastroesophageal reflux disease)   ? Hypertension   ? Pneumonia 12/03/2013  ? Primary localized osteoarthritis of left hip 10/14/2019  ? Sleep apnea   ? cpap sleep study - [facility]  ? ? ?Past Surgical History: ?Past Surgical History:  ?Procedure Laterality Date  ? KNEE SURGERY Right 10/2012  ? LUMBAR LAMINECTOMY/DECOMPRESSION MICRODISCECTOMY  11/01/2011  ? Procedure: LUMBAR LAMINECTOMY/DECOMPRESSION MICRODISCECTOMY;  Surgeon: Sinclair Ship, MD;  Location: [facility];  Service: Orthopedics;  Laterality: Left;  Left sided lumbar 5-  sacrum 1 microdisectomy  ? microdiskectomy  11/01/2011  ? PROSTATE SURGERY    ? s/p cancer  ? ROTATOR CUFF REPAIR    ? left shoulder  ? TOTAL HIP ARTHROPLASTY Left 11/04/2019  ? Procedure: TOTAL HIP ARTHROPLASTY ANTERIOR APPROACH;  Surgeon: Renette Butters, MD;  [facility];  Service: Orthopedics;  Laterality: Left;  ? ? ?Current Medications: ?Current Meds  ?Medication Sig  ? amLODipine (NORVASC) 10 MG tablet Take 10 mg by mouth daily.  ? ASPIRIN 81 PO Take by mouth daily.  ? buPROPion (WELLBUTRIN XL) 150 MG 24 hr tablet Take 150 mg by mouth daily.   ? candesartan (ATACAND) 8 MG tablet Take 8 mg by mouth in the morning and at bedtime.   ? docusate sodium (COLACE) 100 MG capsule Take 100 mg by mouth once a week.  ? DULoxetine (CYMBALTA) 30 MG capsule Take 30 mg by mouth daily.  ? gabapentin (NEURONTIN) 300 MG capsule Take by mouth in the morning and at bedtime.   ? glucagon (GLUCAGEN) 1 MG SOLR injection Inject 1 mg into the vein once as needed (severely low blood sugar).  ? hydrochlorothiazide (HYDRODIURIL) 25 MG tablet Take 1 tablet (25 mg total) by mouth daily.  ? metoprolol succinate (TOPROL-XL) 50 MG 24 hr tablet Take 50 mg by mouth daily.   ?  omeprazole (PRILOSEC) 20 MG capsule Take 20 mg by mouth daily. Take 1 capsule (20 mg) by mouth scheduled every morning, may take an additional dose in the evening if needed for acid reflux/indigestion.  ? OZEMPIC, 1 MG/DOSE, 4 MG/3ML SOPN Inject 1 mg into the skin every Wednesday.  ? sildenafil (VIAGRA) 100 MG tablet Take 100 mg by mouth daily as needed for erectile dysfunction.   ? traZODone (DESYREL) 50 MG tablet TAKE ONE TABLET BY MOUTH AT BEDTIME FOR SLEEP  ? [DISCONTINUED] rosuvastatin (CRESTOR) 40 MG tablet Take 40 mg by mouth at bedtime.   ?  ? ?Allergies:    ?Lisinopril and Metformin  ? ?Social History: ?Social History  ? ?Socioeconomic History  ? Marital status: Married  ?  Spouse name: Not on file  ? Number of children: 5  ? Years of education: Not on file   ? Highest education level: Not on file  ?Occupational History  ? Occupation: Retired Midwife  ?Tobacco Use  ? Smoking status: Former  ?  Years: 30.00  ?  Types: Pipe, Cigarettes  ?  Quit date: 05/04/1993  ?  Years since quitting: 28.2  ? Smokeless tobacco: Never  ?Vaping Use  ? Vaping Use: Never used  ?Substance and Sexual Activity  ? Alcohol use: Yes  ?  Comment: ocassional  ? Drug use: No  ? Sexual activity: Yes  ?Other Topics Concern  ? Not on file  ?Social History Narrative  ? Not on file  ? ?Social Determinants of Health  ? ?Financial Resource Strain: Not on file  ?Food Insecurity: Not on file  ?Transportation Needs: Not on file  ?Physical Activity: Not on file  ?Stress: Not on file  ?Social Connections: Not on file  ?  ? ?Family History: ?The patient's family history includes Alzheimer's disease in his mother; Cancer in his father; Heart disease in his brother, paternal grandmother, and sister. ? ?ROS:   ?All other ROS reviewed and negative. Pertinent positives noted in the HPI.    ? ?EKGs/Labs/Other Studies Reviewed:   ?The following studies were personally reviewed by me today: ? ?TTE 04/22/2021 ? 1. Left ventricular ejection fraction, by estimation, is 60 to 65%. The  ?left ventricle has normal function. The left ventricle has no regional  ?wall motion abnormalities. Left ventricular diastolic parameters are  ?consistent with Grade I diastolic  ?dysfunction (impaired relaxation).  ? 2. Right ventricular systolic function is normal. The right ventricular  ?size is normal.  ? 3. The mitral valve is normal in structure. Trivial mitral valve  ?regurgitation. No evidence of mitral stenosis.  ? 4. The aortic valve is tricuspid. Aortic valve regurgitation is not  ?visualized. No aortic stenosis is present.  ? 5. The inferior vena cava is normal in size with greater than 50%  ?respiratory variability, suggesting right atrial pressure of 3 mmHg.  ? ?Recent Labs: ?08/23/2020: ALT  21; BUN 12; Creatinine, Ser 1.37; Platelets 276; Potassium 4.3; Sodium 142 ?08/24/2020: Hemoglobin 11.9  ? ?Recent Lipid Panel ?   ?Component Value Date/Time  ? CHOL 106 09/24/2018 0209  ? TRIG 100 09/24/2018 0209  ? HDL 37 (L) 09/24/2018 0209  ? CHOLHDL 2.9 09/24/2018 0209  ? VLDL 20 09/24/2018 0209  ? JAARS 49 09/24/2018 0209  ? ? ?Physical Exam:   ?VS:  BP 120/86   Pulse 89   Ht '5\' 6"'$  (1.676 m)   Wt 171 lb (77.6 kg)   SpO2 96%   BMI 27.60  kg/m?    ?Wt Readings from Last 3 Encounters:  ?07/15/21 171 lb (77.6 kg)  ?03/31/21 177 lb 9.6 oz (80.6 kg)  ?11/04/19 176 lb 1 oz (79.9 kg)  ?  ?General: Well nourished, well developed, in no acute distress ?Head: Atraumatic, normal size  ?Eyes: PEERLA, EOMI  ?Neck: Supple, no JVD ?Endocrine: No thryomegaly ?Cardiac: Normal S1, S2; RRR; no murmurs, rubs, or gallops ?Lungs: Clear to auscultation bilaterally, no wheezing, rhonchi or rales  ?Abd: Soft, nontender, no hepatomegaly  ?Ext: No edema, pulses 2+ ?Musculoskeletal: No deformities, BUE and BLE strength normal and equal ?Skin: Warm and dry, no rashes   ?Neuro: Alert and oriented to person, place, time, and situation, CNII-XII grossly intact, no focal deficits  ?Psych: Normal mood and affect  ? ?ASSESSMENT:   ?Dean Brown is a 76 y.o. male who presents for the following: ?1. Hyperlipidemia, unspecified hyperlipidemia type   ?2. Primary hypertension   ?3. Preoperative cardiovascular examination   ? ? ?PLAN:   ?1. Hyperlipidemia, unspecified hyperlipidemia type ?-He is diabetic.  LDL cholesterol should be less than 70.  On Crestor 40 mg daily.  On Zetia 10 mg daily.  We will set him up for repeat lipid check in the next few weeks.  Did not have labs drawn by primary care physician recently. ? ?2. Primary hypertension ?-Well-controlled.  Continue amlodipine 10 mg daily, HCTZ 25 mg daily, metoprolol succinate 50 mg daily, candesartan 8 mg twice daily. ? ?3. Preoperative cardiovascular examination ?-Stable.  No symptoms  of chest pain or trouble breathing.  Recent normal echo.  Okay to proceed with surgery.  Should hold aspirin due to spine surgery. ? ?Disposition: Return in about 6 months (around 01/15/2022). ? ?Medication Adj

## 2021-07-15 ENCOUNTER — Encounter: Payer: Self-pay | Admitting: Cardiovascular Disease

## 2021-07-15 ENCOUNTER — Ambulatory Visit (INDEPENDENT_AMBULATORY_CARE_PROVIDER_SITE_OTHER): Payer: Medicare PPO | Admitting: Cardiovascular Disease

## 2021-07-15 ENCOUNTER — Other Ambulatory Visit: Payer: Self-pay

## 2021-07-15 VITALS — BP 120/86 | HR 89 | Ht 66.0 in | Wt 171.0 lb

## 2021-07-15 DIAGNOSIS — E785 Hyperlipidemia, unspecified: Secondary | ICD-10-CM

## 2021-07-15 DIAGNOSIS — Z01810 Encounter for preprocedural cardiovascular examination: Secondary | ICD-10-CM

## 2021-07-15 DIAGNOSIS — I10 Essential (primary) hypertension: Secondary | ICD-10-CM

## 2021-07-15 DIAGNOSIS — E119 Type 2 diabetes mellitus without complications: Secondary | ICD-10-CM | POA: Diagnosis not present

## 2021-07-15 MED ORDER — ROSUVASTATIN CALCIUM 40 MG PO TABS: 40.0000 mg | ORAL_TABLET | Freq: Every day | ORAL | 1 refills | Status: AC

## 2021-07-15 NOTE — Patient Instructions (Signed)
Medication Instructions:  ?The current medical regimen is effective;  continue present plan and medications. ? ?*If you need a refill on your cardiac medications before your next appointment, please call your pharmacy* ? ?Labs:  ?LIPID (come back fasting, nothing to eat or drink, no lab appointment needed) ? ?Follow-Up: ?At [facility], you and your health needs are our priority.  As part of our continuing mission to provide you with exceptional heart care, we have created designated Provider Care Teams.  These Care Teams include your primary Cardiologist (physician) and Advanced Practice Providers (APPs -  Physician Assistants and Nurse Practitioners) who all work together to provide you with the care you need, when you need it. ? ?We recommend signing up for the patient portal called "MyChart".  Sign up information is provided on this After Visit Summary.  MyChart is used to connect with patients for Virtual Visits (Telemedicine).  Patients are able to view lab/test results, encounter notes, upcoming appointments, etc.  Non-urgent messages can be sent to your provider as well.   ?To learn more about what you can do with MyChart, go to [email or web address].   ? ?Your next appointment:   ?6 month(s) ? ?The format for your next appointment:   ?In Person ? ?Provider:   ?Eleonore Chiquito, MD ? ?

## 2021-07-22 ENCOUNTER — Telehealth: Payer: Self-pay

## 2021-07-22 NOTE — Telephone Encounter (Signed)
? ?  Primary Cardiologist: Evalina Field, MD ? ?Chart reviewed as part of pre-operative protocol coverage. Given past medical history and time since last visit, based on ACC/AHA guidelines, BRIXTON FRANKO would be at acceptable risk for the planned procedure without further cardiovascular testing.  ? ?His aspirin may be held for 5-7 days prior to his procedure.  Please resume as soon as hemostasis is achieved. ? ?I will route this recommendation to the requesting party via Epic fax function and remove from pre-op pool. ? ?Please call with questions. ? ?Jossie Ng. Amisadai Woodford NP-C ? ?  ?07/22/2021, 3:58 PM ?[place] ?[address] ?Office [phone] Fax [phone] ? ? ? ? ?

## 2021-07-22 NOTE — Telephone Encounter (Signed)
? ?  Pre-operative Risk Assessment  ?  ?Patient Name: Dean Brown  ?DOB: 1946/03/20 ?MRN: [id number]  ? ?  ? ?Request for Surgical Clearance   ? ?Procedure:   SCS-IPG IMPLANT ? ?Date of Surgery:  Clearance TBD                              ?   ?Surgeon:  Starling Manns, MD ?Surgeon's Group or Practice Name:  [facility]  ?Phone number:  [phone] ?Fax number:  [phone] ?  ?Type of Clearance Requested:   ?- Medical  ?  ?Type of Anesthesia:  General  ?  ?Additional requests/questions:   ? ? ? ?

## 2021-08-10 ENCOUNTER — Ambulatory Visit (INDEPENDENT_AMBULATORY_CARE_PROVIDER_SITE_OTHER): Payer: Medicare PPO | Admitting: Podiatry

## 2021-08-10 DIAGNOSIS — E0843 Diabetes mellitus due to underlying condition with diabetic autonomic (poly)neuropathy: Secondary | ICD-10-CM

## 2021-08-10 DIAGNOSIS — M79674 Pain in right toe(s): Secondary | ICD-10-CM | POA: Diagnosis not present

## 2021-08-10 DIAGNOSIS — B351 Tinea unguium: Secondary | ICD-10-CM

## 2021-08-10 DIAGNOSIS — M79675 Pain in left toe(s): Secondary | ICD-10-CM | POA: Diagnosis not present

## 2021-08-10 DIAGNOSIS — L989 Disorder of the skin and subcutaneous tissue, unspecified: Secondary | ICD-10-CM | POA: Diagnosis not present

## 2021-08-10 NOTE — Progress Notes (Signed)
? ?  SUBJECTIVE ?Patient with a history of diabetes mellitus presents to office today complaining of elongated, thickened nails that cause pain while ambulating in shoes.  He is unable to trim his own nails. Patient is here for further evaluation and treatment. ? ?Past Medical History:  ?Diagnosis Date  ? Anxiety   ? Arthritis   ? Cancer [facility])   ? Prostate cancer  ? Depression   ? Diabetes mellitus   ? type 2  ? Dyspnea   ? GERD (gastroesophageal reflux disease)   ? Hypertension   ? Pneumonia 12/03/2013  ? Primary localized osteoarthritis of left hip 10/14/2019  ? Sleep apnea   ? cpap sleep study - [facility]  ? ? ?OBJECTIVE ?General Patient is awake, alert, and oriented x 3 and in no acute distress. ?Derm Skin is dry and supple bilateral. Negative open lesions or macerations. Remaining integument unremarkable. Nails are tender, long, thickened and dystrophic with subungual debris, consistent with onychomycosis, 1-5 bilateral. No signs of infection noted.  Hyperkeratotic preulcerative callus lesions noted bilateral feet ?Vasc  DP and PT pedal pulses palpable bilaterally. Temperature gradient within normal limits.  ?Neuro Epicritic and protective threshold sensation diminished bilaterally.  ?Musculoskeletal Exam No symptomatic pedal deformities noted bilateral. Muscular strength within normal limits. ? ?ASSESSMENT ?1. Diabetes Mellitus w/ peripheral neuropathy ?2. Onychomycosis of nail due to dermatophyte bilateral ?3. Pain in foot bilateral ?4.  Preulcerative callus lesions bilateral feet ?5.  Symptomatic dystrophic nail right third toe secondary to trauma 20+ years ago; continues to be asymptomatic ? ?PLAN OF CARE ?1. Patient evaluated today. ?2. Instructed to maintain good pedal hygiene and foot care. Stressed importance of controlling blood sugar.  ?3. Mechanical debridement of nails 1-5 bilaterally performed using a nail nipper. Filed with dremel without incident.  ?4.  Excisional debridement of the hyperkeratotic  preulcerative callus tissue was performed using a tissue nipper without incident or bleeding  ?5.  Return to clinic in 3 months.  Patient states that his right ankle is doing well for him right now.  He did suffer from right ankle pain for several years while being in the service in the [employer] but he would not felt like to have it evaluated today. ? ?Edrick Kins, DPM ?[place] ? ?Dr. Edrick Kins, DPM  ?  ?[address]                                       ?[place], [place] [address]                ?Office [phone]  ?Fax [phone] ? ? ? ? ? ?

## 2021-11-09 ENCOUNTER — Ambulatory Visit (INDEPENDENT_AMBULATORY_CARE_PROVIDER_SITE_OTHER): Payer: Medicare PPO | Admitting: Podiatry

## 2021-11-09 DIAGNOSIS — M79674 Pain in right toe(s): Secondary | ICD-10-CM | POA: Diagnosis not present

## 2021-11-09 DIAGNOSIS — L989 Disorder of the skin and subcutaneous tissue, unspecified: Secondary | ICD-10-CM

## 2021-11-09 DIAGNOSIS — B351 Tinea unguium: Secondary | ICD-10-CM | POA: Diagnosis not present

## 2021-11-09 DIAGNOSIS — E0843 Diabetes mellitus due to underlying condition with diabetic autonomic (poly)neuropathy: Secondary | ICD-10-CM | POA: Diagnosis not present

## 2021-11-09 DIAGNOSIS — M79675 Pain in left toe(s): Secondary | ICD-10-CM

## 2021-11-09 NOTE — Progress Notes (Signed)
Chief Complaint  Patient presents with   foot care    Diabetic foot care    SUBJECTIVE Patient with a history of diabetes mellitus presents to office today complaining of elongated, thickened nails that cause pain while ambulating in shoes.  He is unable to trim his own nails. Patient is here for further evaluation and treatment.  Last visit the patient related a history of trauma to the right third toenail about 20+ years ago when in the [employer].  He states that the nail has been sensitive and painful ever since.  He says that currently it is not too painful.    Past Medical History:  Diagnosis Date   Anxiety    Arthritis    Cancer ([facility])    Prostate cancer   Depression    Diabetes mellitus    type 2   Dyspnea    GERD (gastroesophageal reflux disease)    Hypertension    Pneumonia 12/03/2013   Primary localized osteoarthritis of left hip 10/14/2019   Sleep apnea    cpap sleep study - [facility]   Past Surgical History:  Procedure Laterality Date   KNEE SURGERY Right 10/2012   LUMBAR LAMINECTOMY/DECOMPRESSION MICRODISCECTOMY  11/01/2011   Procedure: LUMBAR LAMINECTOMY/DECOMPRESSION MICRODISCECTOMY;  Surgeon: Sinclair Ship, MD;  Location: [facility];  Service: Orthopedics;  Laterality: Left;  Left sided lumbar 5- sacrum 1 microdisectomy   microdiskectomy  11/01/2011   PROSTATE SURGERY     s/p cancer   ROTATOR CUFF REPAIR     left shoulder   TOTAL HIP ARTHROPLASTY Left 11/04/2019   Procedure: TOTAL HIP ARTHROPLASTY ANTERIOR APPROACH;  Surgeon: Renette Butters, MD;  [facility];  Service: Orthopedics;  Laterality: Left;   Allergies  Allergen Reactions   Lisinopril Cough   Metformin Nausea Only and Other (See Comments)    "makes him feel bad"     OBJECTIVE General Patient is awake, alert, and oriented x 3 and in no acute distress. Derm Skin is dry and supple bilateral. Negative open lesions or macerations. Remaining integument unremarkable. Nails are tender, long,  thickened and dystrophic with subungual debris, consistent with onychomycosis, 1-5 bilateral. No signs of infection noted.  Hyperkeratotic preulcerative callus lesions noted bilateral feet Vasc  DP and PT pedal pulses palpable bilaterally. Temperature gradient within normal limits.  Neuro Epicritic and protective threshold sensation diminished bilaterally.  Musculoskeletal Exam No symptomatic pedal deformities noted bilateral. Muscular strength within normal limits.  ASSESSMENT 1. Diabetes Mellitus w/ peripheral neuropathy 2. Onychomycosis of nail due to dermatophyte bilateral 3. Preulcerative callus lesions bilateral feet 4.  Symptomatic dystrophic nail right third toe secondary to trauma 20+ years ago; currently asymptomatic  PLAN OF CARE 1. Patient evaluated today. 2. Instructed to maintain good pedal hygiene and foot care. Stressed importance of controlling blood sugar.  3. Mechanical debridement of nails 1-5 bilaterally performed using a nail nipper. Filed with dremel without incident.  4.  Excisional debridement of the hyperkeratotic preulcerative callus tissue was performed using a tissue nipper without incident or bleeding  5.  Return to clinic in 3 months.    Edrick Kins, DPM [facility]  Dr. Edrick Kins, DPM    [address], [place] [address]  Office [phone]  Fax [phone]

## 2021-11-15 ENCOUNTER — Other Ambulatory Visit: Payer: Self-pay | Admitting: Orthopaedic Surgery

## 2021-11-15 DIAGNOSIS — M4802 Spinal stenosis, cervical region: Secondary | ICD-10-CM

## 2022-01-14 ENCOUNTER — Ambulatory Visit
Admission: RE | Admit: 2022-01-14 | Discharge: 2022-01-14 | Disposition: A | Discharge status: Home / Self Care | Payer: Medicare PPO | Source: Ambulatory Visit | Attending: Orthopaedic Surgery | Admitting: Orthopaedic Surgery

## 2022-01-14 DIAGNOSIS — M4802 Spinal stenosis, cervical region: Secondary | ICD-10-CM

## 2022-02-13 ENCOUNTER — Ambulatory Visit: Payer: Medicare PPO | Admitting: Podiatry

## 2022-06-05 ENCOUNTER — Ambulatory Visit (INDEPENDENT_AMBULATORY_CARE_PROVIDER_SITE_OTHER): Payer: Medicare PPO | Admitting: Podiatry

## 2022-06-05 DIAGNOSIS — M79674 Pain in right toe(s): Secondary | ICD-10-CM

## 2022-06-05 DIAGNOSIS — M79675 Pain in left toe(s): Secondary | ICD-10-CM | POA: Diagnosis not present

## 2022-06-05 DIAGNOSIS — L989 Disorder of the skin and subcutaneous tissue, unspecified: Secondary | ICD-10-CM | POA: Diagnosis not present

## 2022-06-05 DIAGNOSIS — B351 Tinea unguium: Secondary | ICD-10-CM | POA: Diagnosis not present

## 2022-06-05 NOTE — Progress Notes (Signed)
No chief complaint on file.   SUBJECTIVE Patient with a history of diabetes mellitus presents to office today complaining of elongated, thickened nails that cause pain while ambulating in shoes.  He is unable to trim his own nails. Patient is here for further evaluation and treatment.  Last visit the patient related a history of trauma to the right third toenail about 20+ years ago when in the [employer].  He states that the nail has been sensitive and painful ever since.  He says that currently it is not too painful.    Past Medical History:  Diagnosis Date   Anxiety    Arthritis    Cancer ([place])    Prostate cancer   Depression    Diabetes mellitus    type 2   Dyspnea    GERD (gastroesophageal reflux disease)    Hypertension    Pneumonia 12/03/2013   Primary localized osteoarthritis of left hip 10/14/2019   Sleep apnea    cpap sleep study - [facility]   Past Surgical History:  Procedure Laterality Date   KNEE SURGERY Right 10/2012   LUMBAR LAMINECTOMY/DECOMPRESSION MICRODISCECTOMY  11/01/2011   Procedure: LUMBAR LAMINECTOMY/DECOMPRESSION MICRODISCECTOMY;  Surgeon: Sinclair Ship, MD;  Location: [facility];  Service: Orthopedics;  Laterality: Left;  Left sided lumbar 5- sacrum 1 microdisectomy   microdiskectomy  11/01/2011   PROSTATE SURGERY     s/p cancer   ROTATOR CUFF REPAIR     left shoulder   TOTAL HIP ARTHROPLASTY Left 11/04/2019   Procedure: TOTAL HIP ARTHROPLASTY ANTERIOR APPROACH;  Surgeon: Renette Butters, MD;  [facility];  Service: Orthopedics;  Laterality: Left;   Allergies  Allergen Reactions   Lisinopril Cough   Metformin Nausea Only and Other (See Comments)    "makes him feel bad"     OBJECTIVE General Patient is awake, alert, and oriented x 3 and in no acute distress. Derm Skin is dry and supple bilateral. Negative open lesions or macerations. Remaining integument unremarkable. Nails are tender, long, thickened and dystrophic with subungual debris,  consistent with onychomycosis, 1-5 bilateral. No signs of infection noted.  Hyperkeratotic preulcerative callus lesions noted bilateral feet Vasc  DP and PT pedal pulses palpable bilaterally. Temperature gradient within normal limits.  Neuro Epicritic and protective threshold sensation diminished bilaterally.  Musculoskeletal Exam No symptomatic pedal deformities noted bilateral. Muscular strength within normal limits.  ASSESSMENT 1. Diabetes Mellitus w/ peripheral neuropathy 2. Onychomycosis of nail due to dermatophyte bilateral 3. Preulcerative callus lesions bilateral feet 4.  Symptomatic dystrophic nail right third toe secondary to trauma 20+ years ago; currently asymptomatic  PLAN OF CARE 1. Patient evaluated today. 2. Instructed to maintain good pedal hygiene and foot care. Stressed importance of controlling blood sugar.  3. Mechanical debridement of nails 1-5 bilaterally performed using a nail nipper. Filed with dremel without incident.  4.  Excisional debridement of the hyperkeratotic preulcerative callus tissue was performed using a tissue nipper without incident or bleeding as a courtesy for the patient 5.  Return to clinic in 3 months.    Edrick Kins, DPM [facility]  Dr. Edrick Kins, DPM    [address], [place] [address]  Office [phone]  Fax [phone]

## 2022-08-26 ENCOUNTER — Ambulatory Visit (INDEPENDENT_AMBULATORY_CARE_PROVIDER_SITE_OTHER): Payer: Medicare PPO

## 2022-08-26 ENCOUNTER — Encounter (HOSPITAL_COMMUNITY): Payer: Self-pay

## 2022-08-26 ENCOUNTER — Ambulatory Visit (HOSPITAL_COMMUNITY)
Admission: EM | Admit: 2022-08-26 | Discharge: 2022-08-26 | Disposition: A | Discharge status: Home / Self Care | Payer: Medicare PPO | Attending: Physician Assistant | Admitting: Physician Assistant

## 2022-08-26 DIAGNOSIS — L02511 Cutaneous abscess of right hand: Secondary | ICD-10-CM | POA: Diagnosis not present

## 2022-08-26 MED ORDER — PENTAFLUOROPROP-TETRAFLUOROETH EX AERO
INHALATION_SPRAY | CUTANEOUS | Status: AC
  Filled 2022-08-26: qty 30

## 2022-08-26 MED ORDER — SULFAMETHOXAZOLE-TRIMETHOPRIM 800-160 MG PO TABS: 1.0000 | ORAL_TABLET | Freq: Two times a day (BID) | ORAL | 0 refills | Status: AC

## 2022-08-26 NOTE — ED Provider Notes (Signed)
[facility]    CSN: [id number] Arrival date & time: 08/26/22  1609      History   Chief Complaint Chief Complaint  Patient presents with   Abscess    Right hand    HPI Dean Brown is a 77 y.o. male.   Patient presents today with a 1 month history of painful nodule on his right hand.  Reports that he initially had an infection around his fingernail that then spread up into the finger and hand.  When this initially began he was seen by different urgent care and given antibiotics but he does not member the name of this.  He has had ongoing pain and swelling since that time and was seen at [facility] where he had an ultrasound that showed fluid-filled nodule with recommendation for MRI if symptoms persisted.  He has not had an MRI.  He reports that he has ongoing pain which is rated 5 on a 0-10 pain scale, described as aching, no aggravating relieving factors identified.  He denies history of recurrent skin infections including MRSA.    Past Medical History:  Diagnosis Date   Anxiety    Arthritis    Cancer (HCC)    Prostate cancer   Depression    Diabetes mellitus    type 2   Dyspnea    GERD (gastroesophageal reflux disease)    Hypertension    Pneumonia 12/03/2013   Primary localized osteoarthritis of left hip 10/14/2019   Sleep apnea    cpap sleep study - [facility]    Patient Active Problem List   Diagnosis Date Noted   Status post total hip replacement, left 11/04/2019   Primary localized osteoarthritis of left hip 10/14/2019   DM2 (diabetes mellitus, type 2) (HCC) 09/24/2018   Essential hypertension 09/24/2018   Hypokalemia 09/24/2018   Chest pain 09/23/2018   Pseudophakia of right eye 09/17/2018   Rotator cuff syndrome of right shoulder 06/21/2018   History of prostate cancer 12/04/2017   Insomnia 09/12/2017   ED (erectile dysfunction) 01/23/2017   Spinal stenosis of lumbar region with neurogenic claudication 04/03/2016   Gross hematuria 11/16/2014   Mixed  stress and urge urinary incontinence 05/18/2014   SIRS (systemic inflammatory response syndrome) (HCC) 12/03/2013   Acid reflux 12/03/2013   BP (high blood pressure) 12/03/2013   Arthritis, degenerative 12/03/2013   History of penile implant 06/16/2013   HLD (hyperlipidemia) 03/08/2012   Obstructive apnea 08/04/2011   Hypogonadism male 07/22/2011   ACHILLES TENDINITIS 11/10/2008   PES PLANUS 11/10/2008   HALLUX VALGUS 11/10/2008    Past Surgical History:  Procedure Laterality Date   KNEE SURGERY Right 10/2012   LUMBAR LAMINECTOMY/DECOMPRESSION MICRODISCECTOMY  11/01/2011   Procedure: LUMBAR LAMINECTOMY/DECOMPRESSION MICRODISCECTOMY;  Surgeon: Emilee Hero, MD;  Location: [facility] OR;  Service: Orthopedics;  Laterality: Left;  Left sided lumbar 5- sacrum 1 microdisectomy   microdiskectomy  11/01/2011   PROSTATE SURGERY     s/p cancer   ROTATOR CUFF REPAIR     left shoulder   TOTAL HIP ARTHROPLASTY Left 11/04/2019   Procedure: TOTAL HIP ARTHROPLASTY ANTERIOR APPROACH;  Surgeon: Sheral Apley, MD;  Location: [facility];  Service: Orthopedics;  Laterality: Left;       Home Medications    Prior to Admission medications   Medication Sig Start Date End Date Taking? Authorizing Provider  sulfamethoxazole-trimethoprim (BACTRIM DS) 800-160 MG tablet Take 1 tablet by mouth 2 (two) times daily for 7 days. 08/26/22 09/02/22 Yes Flora Parks, Denny Peon  K, PA-C  amLODipine (NORVASC) 10 MG tablet Take 10 mg by mouth daily.    [provider]  ASPIRIN 81 PO Take by mouth daily.    [provider]  buPROPion (WELLBUTRIN XL) 150 MG 24 hr tablet Take 150 mg by mouth daily.  07/31/18   [provider]  docusate sodium (COLACE) 100 MG capsule Take 100 mg by mouth once a week.    [provider]  DULoxetine (CYMBALTA) 30 MG capsule Take 30 mg by mouth daily. 07/31/18   [provider]  ezetimibe (ZETIA) 10 MG tablet Take 1 tablet (10 mg total) by mouth daily. 03/31/21  06/29/21  O'NealRonnald Ramp, MD  gabapentin (NEURONTIN) 300 MG capsule Take by mouth in the morning and at bedtime.  01/19/16   [provider]  hydrochlorothiazide (HYDRODIURIL) 25 MG tablet Take 1 tablet (25 mg total) by mouth daily. 03/31/21   O'NealRonnald Ramp, MD  metoprolol succinate (TOPROL-XL) 50 MG 24 hr tablet Take 50 mg by mouth daily.  05/21/18   [provider]  omeprazole (PRILOSEC) 20 MG capsule Take 20 mg by mouth daily. Take 1 capsule (20 mg) by mouth scheduled every morning, may take an additional dose in the evening if needed for acid reflux/indigestion.    [provider]  OZEMPIC, 1 MG/DOSE, 4 MG/3ML SOPN Inject 1 mg into the skin every Wednesday. 09/14/19   [provider]  rosuvastatin (CRESTOR) 40 MG tablet Take 1 tablet (40 mg total) by mouth at bedtime. 07/15/21   O'NealRonnald Ramp, MD  sildenafil (VIAGRA) 100 MG tablet Take 100 mg by mouth daily as needed for erectile dysfunction.  06/19/19   [provider]  traZODone (DESYREL) 50 MG tablet TAKE ONE TABLET BY MOUTH AT BEDTIME FOR SLEEP 02/20/18   [provider]    Family History Family History  Problem Relation Age of Onset   Alzheimer's disease Mother    Cancer Father    Heart disease Sister    Heart disease Brother    Heart disease Paternal Grandmother     Social History Social History   Tobacco Use   Smoking status: Former    Years: 30    Types: Pipe, Cigarettes    Quit date: 05/04/1993    Years since quitting: 29.3   Smokeless tobacco: Never  Vaping Use   Vaping Use: Never used  Substance Use Topics   Alcohol use: Yes    Comment: ocassional   Drug use: No     Allergies   Lisinopril and Metformin   Review of Systems Review of Systems  Constitutional:  Positive for activity change. Negative for appetite change, fatigue and fever.  Respiratory:  Negative for cough and shortness of breath.   Cardiovascular:  Negative for chest pain.   Gastrointestinal:  Negative for abdominal pain, diarrhea, nausea and vomiting.  Musculoskeletal:  Positive for arthralgias. Negative for myalgias.  Skin:  Positive for color change and wound.  Neurological:  Negative for weakness and numbness.     Physical Exam Triage Vital Signs ED Triage Vitals  Enc Vitals Group     BP 08/26/22 1729 (!) 156/103     Pulse Rate 08/26/22 1729 73     Resp 08/26/22 1729 16     Temp 08/26/22 1729 98.3 F (36.8 C)     Temp Source 08/26/22 1729 Oral     SpO2 08/26/22 1729 96 %     Weight 08/26/22 1726 174 lb (78.9 kg)  Height 08/26/22 1726 5\' 6"  (1.676 m)     Head Circumference --      Peak Flow --      Pain Score 08/26/22 1724 5     Pain Loc --      Pain Edu? --      Excl. in GC? --    No data found.  Updated Vital Signs BP (!) 156/103 (BP Location: Left Arm)   Pulse 73   Temp 98.3 F (36.8 C) (Oral)   Resp 16   Ht 5\' 6"  (1.676 m)   Wt 174 lb (78.9 kg)   SpO2 96%   BMI 28.08 kg/m   Visual Acuity Right Eye Distance:   Left Eye Distance:   Bilateral Distance:    Right Eye Near:   Left Eye Near:    Bilateral Near:     Physical Exam Vitals reviewed.  Constitutional:      General: He is awake.     Appearance: Normal appearance. He is well-developed. He is not ill-appearing.     Comments: Very pleasant male appears stated age in no acute distress sitting comfortably in exam room  HENT:     Head: Normocephalic and atraumatic.  Cardiovascular:     Rate and Rhythm: Normal rate and regular rhythm.     Heart sounds: Normal heart sounds, S1 normal and S2 normal. No murmur heard.    Comments: Capillary fill slightly delayed in right hand at approximately 3 seconds Pulmonary:     Effort: Pulmonary effort is normal.     Breath sounds: Normal breath sounds. No stridor. No wheezing, rhonchi or rales.     Comments: Clear to auscultation bilateral Musculoskeletal:     Right hand: Swelling, deformity and tenderness present. No bony  tenderness. Normal range of motion. There is no disruption of two-point discrimination. Decreased capillary refill (Approximately 3 seconds right fingers).     Comments: Right hand: Swelling and tenderness at distal right fourth finger.  There is a fluctuant nodule over fourth MCP joint without bleeding or drainage.  Hand is neurovascularly intact.  Neurological:     Mental Status: He is alert.  Psychiatric:        Behavior: Behavior is cooperative.      UC Treatments / Results  Labs (all labs ordered are listed, but only abnormal results are displayed) Labs Reviewed - No data to display  EKG   Radiology DG Hand Complete Right  Result Date: 08/26/2022 CLINICAL DATA:  Pain and swelling EXAM: RIGHT HAND - COMPLETE 3 VIEW COMPARISON:  None Available. FINDINGS: There is no evidence of fracture or dislocation. There is no evidence of arthropathy or other focal bone abnormality. Soft tissue swelling of the ring finger and overlying the MCP joints. IMPRESSION: 1. No acute osseous abnormality 2. Soft tissue swelling of the ring finger and overlying the MCP joints. Electronically Signed   By: Allegra Lai M.D.   On: 08/26/2022 18:07    Procedures Procedures (including critical care time)  Medications Ordered in UC Medications - No data to display  Initial Impression / Assessment and Plan / UC Course  I have reviewed the triage vital signs and the nursing notes.  Pertinent labs & imaging results that were available during my care of the patient were reviewed by me and considered in my medical decision making (see chart for details).     Patient is well-appearing, afebrile, nontoxic, nontachycardic.  X-ray was obtained given 1 month history of concern for abscess that  showed no evidence of osteomyelitis but did show soft tissue swelling.  Patient was interested in having nodule over his MCP joint drained.  This was mildly fluctuant so attempted needle aspiration.  Area was cleaned with  Betadine and then 21-gauge needle was inserted into the fluctuant portion of the nodule and able to express approximately one quarter of a cc of thick purulent drainage.  Hemostasis was achieved with direct pressure and area was dressed with Band-Aid.  Patient tolerated procedure well without complications.  Discussed that the ultrasound he had at [facility] recommended further evaluation with an MRI which we cannot arrange in urgent care.  We will start him on Bactrim DS to cover for infection.  No indication for dose adjustment based on metabolic panel in care everywhere from 08/16/2022 with creatinine of 0.9 and calculated creatinine clearance of 80.39 mL/min.  Discussed that ultimately he will need to see a hand specialist and his report that he follows up with them soon as possible.  He was given contact information for local provider with instruction to call to schedule an appointment first thing Monday morning.  Discussed that if he has any worsening or changing symptoms including increasing pain, fever, nausea, vomiting, increasing pain needs to be seen immediately to which he expressed understanding.  Final Clinical Impressions(s) / UC Diagnoses   Final diagnoses:  Abscess of dorsum of hand, right     Discharge Instructions      We were able to get some of the pus out using the needle.  Keep this area clean with soap and water and use warm compresses to encourage drainage.  Start Bactrim twice daily for 7 days.  If you develop any rash or oral lesions stop the medication to be seen immediately.  Please call and schedule an appointment with hand specialist first thing next week.  If you have any worsening symptoms including increasing pain, redness, swelling, fever, weakness or numbness you should go to the emergency room.     ED Prescriptions     Medication Sig Dispense Auth. Provider   sulfamethoxazole-trimethoprim (BACTRIM DS) 800-160 MG tablet Take 1 tablet by mouth 2 (two) times daily for 7  days. 14 tablet Donzel Romack, Noberto Retort, PA-C      PDMP not reviewed this encounter.   Jeani Hawking, PA-C 08/26/22 1834

## 2022-08-26 NOTE — Discharge Instructions (Signed)
We were able to get some of the pus out using the needle.  Keep this area clean with soap and water and use warm compresses to encourage drainage.  Start Bactrim twice daily for 7 days.  If you develop any rash or oral lesions stop the medication to be seen immediately.  Please call and schedule an appointment with hand specialist first thing next week.  If you have any worsening symptoms including increasing pain, redness, swelling, fever, weakness or numbness you should go to the emergency room.

## 2022-08-26 NOTE — ED Triage Notes (Signed)
Patient here today for a bump on the MCP joint just below the right ring finger X 1 month after having having an abscess under the right ring finger nail. Patient had went through a round of antibiotics for his nail infection and the bump showed up a month after completing the antibiotics. He had an MRI of his right hand. Patient brought in  the report.

## 2022-09-04 ENCOUNTER — Ambulatory Visit: Payer: Medicare PPO | Admitting: Podiatry

## 2022-11-08 ENCOUNTER — Ambulatory Visit: Payer: Medicare PPO | Admitting: Podiatry

## 2022-11-08 ENCOUNTER — Encounter: Payer: Self-pay | Admitting: Podiatry

## 2022-11-08 DIAGNOSIS — M79675 Pain in left toe(s): Secondary | ICD-10-CM

## 2022-11-08 DIAGNOSIS — E119 Type 2 diabetes mellitus without complications: Secondary | ICD-10-CM | POA: Diagnosis not present

## 2022-11-08 DIAGNOSIS — M2141 Flat foot [pes planus] (acquired), right foot: Secondary | ICD-10-CM

## 2022-11-08 DIAGNOSIS — M79674 Pain in right toe(s): Secondary | ICD-10-CM

## 2022-11-08 DIAGNOSIS — B351 Tinea unguium: Secondary | ICD-10-CM

## 2022-11-08 DIAGNOSIS — M2041 Other hammer toe(s) (acquired), right foot: Secondary | ICD-10-CM

## 2022-11-08 NOTE — Progress Notes (Signed)
Chief Complaint  Patient presents with   Diabetes    "Take care of my toenails."    SUBJECTIVE Patient with a history of diabetes mellitus presents to office today complaining of elongated, thickened nails that cause pain while ambulating in shoes.  He is unable to trim his own nails. Patient is here for further evaluation and treatment.  Last visit the patient related a history of trauma to the right third toenail about 20+ years ago when in the [employer].  He states that the nail has been sensitive and painful ever since.  He says that currently it is not too painful.    Past Medical History:  Diagnosis Date   Anxiety    Arthritis    Cancer (HCC)    Prostate cancer   Depression    Diabetes mellitus    type 2   Dyspnea    GERD (gastroesophageal reflux disease)    Hypertension    Pneumonia 12/03/2013   Primary localized osteoarthritis of left hip 10/14/2019   Sleep apnea    cpap sleep study - [facility]   Past Surgical History:  Procedure Laterality Date   KNEE SURGERY Right 10/2012   LUMBAR LAMINECTOMY/DECOMPRESSION MICRODISCECTOMY  11/01/2011   Procedure: LUMBAR LAMINECTOMY/DECOMPRESSION MICRODISCECTOMY;  Surgeon: Emilee Hero, MD;  Location: [facility] OR;  Service: Orthopedics;  Laterality: Left;  Left sided lumbar 5- sacrum 1 microdisectomy   microdiskectomy  11/01/2011   PROSTATE SURGERY     s/p cancer   ROTATOR CUFF REPAIR     left shoulder   TOTAL HIP ARTHROPLASTY Left 11/04/2019   Procedure: TOTAL HIP ARTHROPLASTY ANTERIOR APPROACH;  Surgeon: Sheral Apley, MD;  Location: [facility];  Service: Orthopedics;  Laterality: Left;   Allergies  Allergen Reactions   Lisinopril Cough   Metformin Nausea Only and Other (See Comments)    "makes him feel bad"     OBJECTIVE General Patient is awake, alert, and oriented x 3 and in no acute distress. Derm Skin is dry and supple bilateral. Negative open lesions or macerations. Remaining integument unremarkable. Nails are tender,  long, thickened and dystrophic with subungual debris, consistent with onychomycosis, 1-5 bilateral. No signs of infection noted.  Hyperkeratotic preulcerative callus lesions noted bilateral feet Vasc  DP and PT pedal pulses palpable bilaterally. Temperature gradient within normal limits.  Neuro Epicritic and protective threshold sensation diminished bilaterally.  Musculoskeletal Exam with weightbearing there is some collapse of the medial longitudinal arch of the foot consistent with pes planovalgus deformity and slight contracture of the lesser digits of the bilateral feet  ASSESSMENT 1. Diabetes Mellitus w/ peripheral neuropathy 2. Onychomycosis of nail due to dermatophyte bilateral 3. Preulcerative callus lesions bilateral feet 4.  Symptomatic dystrophic nail right third toe secondary to trauma 20+ years ago; currently asymptomatic 5.  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 Patient evaluated today.  Comprehensive diabetic foot exam performed today 2. Instructed to maintain good pedal hygiene and foot care. Stressed importance of controlling blood sugar.  3. Mechanical debridement of nails 1-5 bilaterally performed using a nail nipper. Filed with dremel without incident.  4.  Excisional debridement of the hyperkeratotic preulcerative callus tissue was performed using a tissue nipper without incident or bleeding as a courtesy for the patient 5.  Return to clinic in 3 months.    Felecia Shelling, DPM [facility]  Dr. Felecia Shelling, DPM    [address]  [place], [place] [address]                Office [phone]  Fax [phone]

## 2023-02-07 ENCOUNTER — Ambulatory Visit (INDEPENDENT_AMBULATORY_CARE_PROVIDER_SITE_OTHER): Payer: Medicare PPO | Admitting: Podiatry

## 2023-02-07 ENCOUNTER — Encounter: Payer: Self-pay | Admitting: Podiatry

## 2023-02-07 DIAGNOSIS — M2142 Flat foot [pes planus] (acquired), left foot: Secondary | ICD-10-CM

## 2023-02-07 DIAGNOSIS — E0843 Diabetes mellitus due to underlying condition with diabetic autonomic (poly)neuropathy: Secondary | ICD-10-CM

## 2023-02-07 DIAGNOSIS — M79675 Pain in left toe(s): Secondary | ICD-10-CM

## 2023-02-07 DIAGNOSIS — B351 Tinea unguium: Secondary | ICD-10-CM | POA: Diagnosis not present

## 2023-02-07 DIAGNOSIS — M79674 Pain in right toe(s): Secondary | ICD-10-CM

## 2023-02-07 DIAGNOSIS — M2141 Flat foot [pes planus] (acquired), right foot: Secondary | ICD-10-CM

## 2023-02-07 NOTE — Progress Notes (Signed)
   Chief Complaint  Patient presents with   Foot Pain    DFC  A1C 6.7    SUBJECTIVE Patient with a history of diabetes mellitus presents to office today complaining of elongated, thickened nails that cause pain while ambulating in shoes.  Patient is unable to trim their own nails. Patient is here for further evaluation and treatment.  Past Medical History:  Diagnosis Date   Anxiety    Arthritis    Cancer (HCC)    Prostate cancer   Depression    Diabetes mellitus    type 2   Dyspnea    GERD (gastroesophageal reflux disease)    Hypertension    Pneumonia 12/03/2013   Primary localized osteoarthritis of left hip 10/14/2019   Sleep apnea    cpap sleep study - [facility]    Allergies  Allergen Reactions   Lisinopril Cough   Metformin Nausea Only and Other (See Comments)    "makes him feel bad"      OBJECTIVE General Patient is awake, alert, and oriented x 3 and in no acute distress. Derm Skin is dry and supple bilateral. Negative open lesions or macerations. Remaining integument unremarkable. Nails are tender, long, thickened and dystrophic with subungual debris, consistent with onychomycosis, 1-5 bilateral. No signs of infection noted. Vasc  DP and PT pedal pulses palpable bilaterally. Temperature gradient within normal limits.  Neuro light touch and protective threshold sensation diminished bilaterally.  Musculoskeletal Exam with weightbearing there is collapse of the medial longitudinal arch of the foot consistent with pes planovalgus deformity muscular strength within normal limits.  ASSESSMENT 1. Diabetes Mellitus w/ peripheral neuropathy 2.  Pain due to onychomycosis of toenails bilateral 3.  Pes planus/DJD bilateral feet  PLAN OF CARE 1. Patient evaluated today. 2. Instructed to maintain good pedal hygiene and foot care. Stressed importance of controlling blood sugar.  3. Mechanical debridement of nails 1-5 bilaterally performed using a nail nipper. Filed with dremel  without incident.  4.  Order placed for diabetic shoes with custom molded Plastizote insoles.  Order was printed so the patient continued to the [place] for authorization and approval and referral  5.  Return to clinic in 3 mos.     Felecia Shelling, DPM [facility]  Dr. Felecia Shelling, DPM    [address] [place], [place] [address]                Office [phone]  Fax [phone]

## 2023-05-14 ENCOUNTER — Ambulatory Visit: Payer: Medicare PPO | Admitting: Podiatry

## 2023-06-27 ENCOUNTER — Encounter: Payer: Self-pay | Admitting: Podiatry

## 2023-06-27 ENCOUNTER — Ambulatory Visit (INDEPENDENT_AMBULATORY_CARE_PROVIDER_SITE_OTHER): Payer: Medicare PPO | Admitting: Podiatry

## 2023-06-27 DIAGNOSIS — B351 Tinea unguium: Secondary | ICD-10-CM

## 2023-06-27 DIAGNOSIS — M79675 Pain in left toe(s): Secondary | ICD-10-CM | POA: Diagnosis not present

## 2023-06-27 DIAGNOSIS — M2142 Flat foot [pes planus] (acquired), left foot: Secondary | ICD-10-CM | POA: Diagnosis not present

## 2023-06-27 DIAGNOSIS — E0843 Diabetes mellitus due to underlying condition with diabetic autonomic (poly)neuropathy: Secondary | ICD-10-CM

## 2023-06-27 DIAGNOSIS — M79674 Pain in right toe(s): Secondary | ICD-10-CM

## 2023-06-27 DIAGNOSIS — M2141 Flat foot [pes planus] (acquired), right foot: Secondary | ICD-10-CM | POA: Diagnosis not present

## 2023-06-27 NOTE — Progress Notes (Signed)
   Chief Complaint  Patient presents with   Diabetes    Patient is here for [facility], patient states he has some [place] on left foot.     SUBJECTIVE Patient with a history of diabetes mellitus presents to office today complaining of elongated, thickened nails that cause pain while ambulating in shoes.  Patient is unable to trim their own nails. Patient is here for further evaluation and treatment.  Past Medical History:  Diagnosis Date   Anxiety    Arthritis    Cancer (HCC)    Prostate cancer   Depression    Diabetes mellitus    type 2   Dyspnea    GERD (gastroesophageal reflux disease)    Hypertension    Pneumonia 12/03/2013   Primary localized osteoarthritis of left hip 10/14/2019   Sleep apnea    cpap sleep study - [facility]    Allergies  Allergen Reactions   Lisinopril Cough   Metformin Nausea Only and Other (See Comments)    "makes him feel bad"      OBJECTIVE General Patient is awake, alert, and oriented x 3 and in no acute distress. Derm Skin is dry and supple bilateral. Negative open lesions or macerations. Remaining integument unremarkable. Nails are tender, long, thickened and dystrophic with subungual debris, consistent with onychomycosis, 1-5 bilateral. No signs of infection noted. Vasc  DP and PT pedal pulses palpable bilaterally. Temperature gradient within normal limits.  Neuro light touch and protective threshold sensation diminished bilaterally.  Musculoskeletal Exam with weightbearing there is collapse of the medial longitudinal arch of the foot consistent with pes planovalgus deformity muscular strength within normal limits.  ASSESSMENT 1. Diabetes Mellitus w/ peripheral neuropathy 2.  Pain due to onychomycosis of toenails bilateral 3.  Pes planus/DJD bilateral feet  PLAN OF CARE 1. Patient evaluated today. 2. Instructed to maintain good pedal hygiene and foot care. Stressed importance of controlling blood sugar.  3. Mechanical debridement of nails 1-5  bilaterally performed using a nail nipper. Filed with dremel without incident.  4.  Appointment with diabetic shoe department for diabetic shoes and custom molded Plastizote insoles 5.  Return to clinic in 3 mos.     Felecia Shelling, DPM [facility]  Dr. Felecia Shelling, DPM    [address] [place], [place] [address]                Office [phone]  Fax [phone]

## 2023-08-02 ENCOUNTER — Other Ambulatory Visit

## 2023-08-07 ENCOUNTER — Ambulatory Visit

## 2023-08-07 DIAGNOSIS — M2041 Other hammer toe(s) (acquired), right foot: Secondary | ICD-10-CM

## 2023-08-07 DIAGNOSIS — M2141 Flat foot [pes planus] (acquired), right foot: Secondary | ICD-10-CM

## 2023-08-07 DIAGNOSIS — M2010 Hallux valgus (acquired), unspecified foot: Secondary | ICD-10-CM

## 2023-08-07 DIAGNOSIS — E1100 Type 2 diabetes mellitus with hyperosmolarity without nonketotic hyperglycemic-hyperosmolar coma (NKHHC): Secondary | ICD-10-CM

## 2023-08-07 NOTE — Progress Notes (Signed)
 Patient presents to the office today for diabetic shoe and insole measuring.  Patient was measured with brannock device to determine size and width for 1 pair of extra depth shoes and 3 pair of DM insoles.   Documentation of medical necessity will be sent to patient's treating diabetic doctor to verify and sign.   Patient's diabetic provider: Higinio Love MD   Shoes and insoles will be ordered at that time and patient will be notified for an appointment for fitting when they arrive.   Shoe size (per patient): 8 Shoe choice:   [id number] / double velcro white Shoe size ordered: 8WD PPW signed

## 2023-09-26 ENCOUNTER — Ambulatory Visit (INDEPENDENT_AMBULATORY_CARE_PROVIDER_SITE_OTHER): Admitting: Podiatry

## 2023-09-26 ENCOUNTER — Encounter: Payer: Self-pay | Admitting: Podiatry

## 2023-09-26 VITALS — Ht 66.0 in | Wt 174.0 lb

## 2023-09-26 DIAGNOSIS — D2371 Other benign neoplasm of skin of right lower limb, including hip: Secondary | ICD-10-CM

## 2023-09-26 DIAGNOSIS — M79675 Pain in left toe(s): Secondary | ICD-10-CM | POA: Diagnosis not present

## 2023-09-26 DIAGNOSIS — B351 Tinea unguium: Secondary | ICD-10-CM | POA: Diagnosis not present

## 2023-09-26 DIAGNOSIS — M79674 Pain in right toe(s): Secondary | ICD-10-CM

## 2023-09-26 NOTE — Progress Notes (Signed)
   Chief Complaint  Patient presents with   Nail Problem    Pt is here for [facility].    SUBJECTIVE Patient with a history of diabetes mellitus presents to office today complaining of elongated, thickened nails that cause pain while ambulating in shoes.  Patient is unable to trim their own nails. Patient is here for further evaluation and treatment.  Past Medical History:  Diagnosis Date   Anxiety    Arthritis    Cancer (HCC)    Prostate cancer   Depression    Diabetes mellitus    type 2   Dyspnea    GERD (gastroesophageal reflux disease)    Hypertension    Pneumonia 12/03/2013   Primary localized osteoarthritis of left hip 10/14/2019   Sleep apnea    cpap sleep study - [facility]    Allergies  Allergen Reactions   Lisinopril  Cough   Metformin Nausea Only and Other (See Comments)    "makes him feel bad"      OBJECTIVE General Patient is awake, alert, and oriented x 3 and in no acute distress. Derm Skin is dry and supple bilateral. Negative open lesions or macerations. Remaining integument unremarkable. Nails are tender, long, thickened and dystrophic with subungual debris, consistent with onychomycosis, 1-5 bilateral. No signs of infection noted.  Hyperkeratotic skin lesion noted to the weightbearing surface of the bilateral feet with a central nucleated core and associated tenderness Vasc  DP and PT pedal pulses palpable bilaterally. Temperature gradient within normal limits.  Neuro light touch and protective threshold sensation diminished bilaterally.  Musculoskeletal Exam with weightbearing there is collapse of the medial longitudinal arch of the foot consistent with pes planovalgus deformity muscular strength within normal limits.  ASSESSMENT 1. Diabetes Mellitus w/ peripheral neuropathy 2.  Pain due to onychomycosis of toenails bilateral 3.  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 Patient evaluated today. 2. Instructed to maintain good  pedal hygiene and foot care. Stressed importance of controlling blood sugar.  3. Mechanical debridement of nails 1-5 bilaterally performed using a nail nipper. Filed with dremel without incident.  4.  Excisional debridement of the hyperkeratotic eccrine poroma was performed today with a 312 scalpel without incident or bleeding.  Salicylic acid applied with a Band-Aid. 5.  Return to clinic in 3 mos.     Dot Gazella, DPM [facility]  Dr. Dot Gazella, DPM    [address] [place], [place] [address]                Office [phone]  Fax [phone]

## 2023-09-27 ENCOUNTER — Encounter: Payer: Self-pay | Admitting: Podiatry

## 2023-10-17 ENCOUNTER — Other Ambulatory Visit

## 2023-11-13 ENCOUNTER — Ambulatory Visit (INDEPENDENT_AMBULATORY_CARE_PROVIDER_SITE_OTHER)

## 2023-11-13 DIAGNOSIS — M2041 Other hammer toe(s) (acquired), right foot: Secondary | ICD-10-CM

## 2023-11-13 DIAGNOSIS — M2042 Other hammer toe(s) (acquired), left foot: Secondary | ICD-10-CM | POA: Diagnosis not present

## 2023-11-13 DIAGNOSIS — E1100 Type 2 diabetes mellitus with hyperosmolarity without nonketotic hyperglycemic-hyperosmolar coma (NKHHC): Secondary | ICD-10-CM

## 2023-11-13 DIAGNOSIS — M2142 Flat foot [pes planus] (acquired), left foot: Secondary | ICD-10-CM | POA: Diagnosis not present

## 2023-11-13 DIAGNOSIS — M2141 Flat foot [pes planus] (acquired), right foot: Secondary | ICD-10-CM

## 2023-11-13 NOTE — Progress Notes (Signed)
 Patient presents today to pick up diabetic shoes and insoles.  Patient was dispensed 1 pair of diabetic shoes and 3 pairs of total contact diabetic insoles. Fit was satisfactory. Instructions for break-in and wear was reviewed and a copy was given to the patient.   Re-appointment for regularly scheduled diabetic foot care visits or if they should experience any trouble with the shoes or insoles.    Britton Cane Cped, CFo, CFm

## 2024-01-02 ENCOUNTER — Ambulatory Visit: Admitting: Podiatry

## 2024-01-21 ENCOUNTER — Ambulatory Visit: Admitting: Podiatry

## 2024-01-21 ENCOUNTER — Encounter: Payer: Self-pay | Admitting: Podiatry

## 2024-01-21 VITALS — Ht 66.0 in | Wt 174.0 lb

## 2024-01-21 DIAGNOSIS — M79675 Pain in left toe(s): Secondary | ICD-10-CM

## 2024-01-21 DIAGNOSIS — M79674 Pain in right toe(s): Secondary | ICD-10-CM

## 2024-01-21 DIAGNOSIS — B351 Tinea unguium: Secondary | ICD-10-CM | POA: Diagnosis not present

## 2024-01-21 NOTE — Progress Notes (Signed)
   Chief Complaint  Patient presents with   Nail Problem    DFC.    SUBJECTIVE Patient with a history of diabetes mellitus presents to office today complaining of elongated, thickened nails that cause pain while ambulating in shoes.  Patient is unable to trim their own nails. Patient is here for further evaluation and treatment.  Past Medical History:  Diagnosis Date   Anxiety    Arthritis    Cancer (HCC)    Prostate cancer   Depression    Diabetes mellitus    type 2   Dyspnea    GERD (gastroesophageal reflux disease)    Hypertension    Pneumonia 12/03/2013   Primary localized osteoarthritis of left hip 10/14/2019   Sleep apnea    cpap sleep study - [facility]    Allergies  Allergen Reactions   Lisinopril  Cough   Metformin Nausea Only and Other (See Comments)    makes him feel bad      OBJECTIVE General Patient is awake, alert, and oriented x 3 and in no acute distress. Derm Skin is dry and supple bilateral. Negative open lesions or macerations. Remaining integument unremarkable. Nails are tender, long, thickened and dystrophic with subungual debris, consistent with onychomycosis, 1-5 bilateral. No signs of infection noted. Vasc  DP and PT pedal pulses palpable bilaterally. Temperature gradient within normal limits.  Neuro Epicritic and protective threshold sensation diminished bilaterally.  Musculoskeletal Exam No symptomatic pedal deformities noted bilateral. Muscular strength within normal limits.  ASSESSMENT 1. Diabetes Mellitus w/ peripheral neuropathy 2.  Pain due to onychomycosis of toenails bilateral  PLAN OF CARE 1. Patient evaluated today. 2. Instructed to maintain good pedal hygiene and foot care. Stressed importance of controlling blood sugar.  3. Mechanical debridement of nails 1-5 bilaterally performed using a nail nipper. Filed with dremel without incident.  4. Return to clinic in 3 mos.     Thresa EMERSON Sar, DPM [facility]  Dr. Thresa EMERSON Sar, DPM    [address] [place], [place] [address]                Office [phone]  Fax [phone]

## 2024-04-16 ENCOUNTER — Ambulatory Visit (INDEPENDENT_AMBULATORY_CARE_PROVIDER_SITE_OTHER): Admitting: Podiatry

## 2024-04-16 ENCOUNTER — Encounter: Payer: Self-pay | Admitting: Podiatry

## 2024-04-16 VITALS — Ht 66.0 in | Wt 174.0 lb

## 2024-04-16 DIAGNOSIS — B351 Tinea unguium: Secondary | ICD-10-CM

## 2024-04-16 DIAGNOSIS — Z0189 Encounter for other specified special examinations: Secondary | ICD-10-CM

## 2024-04-16 DIAGNOSIS — M79675 Pain in left toe(s): Secondary | ICD-10-CM | POA: Diagnosis not present

## 2024-04-16 DIAGNOSIS — M79674 Pain in right toe(s): Secondary | ICD-10-CM | POA: Diagnosis not present

## 2024-04-16 DIAGNOSIS — E119 Type 2 diabetes mellitus without complications: Secondary | ICD-10-CM

## 2024-04-21 ENCOUNTER — Ambulatory Visit: Admitting: Podiatry

## 2024-05-05 NOTE — Progress Notes (Signed)
" ° °  Chief Complaint  Patient presents with   Nail Problem    Pt is here for [facility].    SUBJECTIVE Patient with a history of diabetes mellitus presents to office today complaining of elongated, thickened nails that cause pain while ambulating in shoes.  Patient is unable to trim their own nails. Patient is here for further evaluation and treatment.  Past Medical History:  Diagnosis Date   Anxiety    Arthritis    Cancer (HCC)    Prostate cancer   Depression    Diabetes mellitus    type 2   Dyspnea    GERD (gastroesophageal reflux disease)    Hypertension    Pneumonia 12/03/2013   Primary localized osteoarthritis of left hip 10/14/2019   Sleep apnea    cpap sleep study - [facility]    Allergies  Allergen Reactions   Lisinopril  Cough   Metformin Nausea Only and Other (See Comments)    makes him feel bad      OBJECTIVE General Patient is awake, alert, and oriented x 3 and in no acute distress. Derm Skin is dry and supple bilateral. Negative open lesions or macerations. Remaining integument unremarkable. Nails are tender, long, thickened and dystrophic with subungual debris, consistent with onychomycosis, 1-5 bilateral. No signs of infection noted. Vasc  DP and PT pedal pulses palpable bilaterally. Temperature gradient within normal limits.  Neuro Epicritic and protective threshold sensation diminished bilaterally.  Musculoskeletal Exam No symptomatic pedal deformities noted bilateral. Muscular strength within normal limits.  ASSESSMENT 1. Diabetes Mellitus w/ peripheral neuropathy 2.  Pain due to onychomycosis of toenails bilateral  PLAN OF CARE 1. Patient evaluated today.  Comprehensive diabetic foot exam performed today 2. Instructed to maintain good pedal hygiene and foot care. Stressed importance of controlling blood sugar.  3. Mechanical debridement of nails 1-5 bilaterally performed using a nail nipper. Filed with dremel without incident.  4. Return to clinic in 3 mos.      Thresa EMERSON Sar, DPM [facility]  Dr. Thresa EMERSON Sar, DPM    [address] [place], [place] [address]                Office [phone]  Fax [phone]       "

## 2024-07-14 ENCOUNTER — Ambulatory Visit: Admitting: Podiatry
